# Patient Record
Sex: Female | Born: 1980 | Race: Black or African American | Hispanic: No | Marital: Single | Smoking: Former smoker
Health system: Southern US, Community
[De-identification: ages and names within clinical notes are randomized; demographics above are authoritative.]

## PROBLEM LIST (undated history)

## (undated) ENCOUNTER — Inpatient Hospital Stay (HOSPITAL_COMMUNITY): Payer: Self-pay

## (undated) DIAGNOSIS — J45909 Unspecified asthma, uncomplicated: Secondary | ICD-10-CM

## (undated) DIAGNOSIS — L708 Other acne: Secondary | ICD-10-CM

## (undated) DIAGNOSIS — B009 Herpesviral infection, unspecified: Secondary | ICD-10-CM

## (undated) DIAGNOSIS — R87619 Unspecified abnormal cytological findings in specimens from cervix uteri: Secondary | ICD-10-CM

## (undated) DIAGNOSIS — IMO0002 Reserved for concepts with insufficient information to code with codable children: Secondary | ICD-10-CM

## (undated) HISTORY — PX: ROTATOR CUFF REPAIR: SHX139

## (undated) HISTORY — DX: Herpesviral infection, unspecified: B00.9

## (undated) HISTORY — DX: Reserved for concepts with insufficient information to code with codable children: IMO0002

## (undated) HISTORY — DX: Unspecified asthma, uncomplicated: J45.909

## (undated) HISTORY — DX: Unspecified abnormal cytological findings in specimens from cervix uteri: R87.619

## (undated) HISTORY — PX: TOOTH EXTRACTION: SUR596

## (undated) HISTORY — PX: FINGER SURGERY: SHX640

---
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
## 2002-01-11 ENCOUNTER — Emergency Department (HOSPITAL_COMMUNITY): Admission: EM | Admit: 2002-01-11 | Discharge: 2002-01-11 | Payer: Self-pay | Admitting: Emergency Medicine

## 2002-02-13 ENCOUNTER — Other Ambulatory Visit: Admission: RE | Admit: 2002-02-13 | Discharge: 2002-02-13 | Payer: Self-pay | Admitting: *Deleted

## 2002-05-15 ENCOUNTER — Encounter: Payer: Self-pay | Admitting: Emergency Medicine

## 2002-05-15 ENCOUNTER — Emergency Department (HOSPITAL_COMMUNITY): Admission: EM | Admit: 2002-05-15 | Discharge: 2002-05-15 | Payer: Self-pay | Admitting: Emergency Medicine

## 2002-06-04 ENCOUNTER — Emergency Department (HOSPITAL_COMMUNITY): Admission: EM | Admit: 2002-06-04 | Discharge: 2002-06-04 | Payer: Self-pay | Admitting: Emergency Medicine

## 2002-06-04 ENCOUNTER — Encounter: Payer: Self-pay | Admitting: Emergency Medicine

## 2002-11-17 ENCOUNTER — Other Ambulatory Visit: Admission: RE | Admit: 2002-11-17 | Discharge: 2002-11-17 | Payer: Self-pay | Admitting: Obstetrics & Gynecology

## 2003-04-05 ENCOUNTER — Other Ambulatory Visit: Admission: RE | Admit: 2003-04-05 | Discharge: 2003-04-05 | Payer: Self-pay | Admitting: Obstetrics and Gynecology

## 2003-04-08 ENCOUNTER — Inpatient Hospital Stay (HOSPITAL_COMMUNITY): Admission: AD | Admit: 2003-04-08 | Discharge: 2003-04-08 | Payer: Self-pay | Admitting: Obstetrics and Gynecology

## 2003-04-25 ENCOUNTER — Inpatient Hospital Stay (HOSPITAL_COMMUNITY): Admission: AD | Admit: 2003-04-25 | Discharge: 2003-04-25 | Payer: Self-pay | Admitting: Obstetrics and Gynecology

## 2003-06-17 ENCOUNTER — Inpatient Hospital Stay (HOSPITAL_COMMUNITY): Admission: AD | Admit: 2003-06-17 | Discharge: 2003-06-19 | Payer: Self-pay | Admitting: Obstetrics and Gynecology

## 2003-07-09 ENCOUNTER — Inpatient Hospital Stay (HOSPITAL_COMMUNITY): Admission: AD | Admit: 2003-07-09 | Discharge: 2003-07-09 | Payer: Self-pay | Admitting: *Deleted

## 2003-09-11 ENCOUNTER — Inpatient Hospital Stay (HOSPITAL_COMMUNITY): Admission: AD | Admit: 2003-09-11 | Discharge: 2003-09-14 | Payer: Self-pay | Admitting: Obstetrics and Gynecology

## 2003-10-25 ENCOUNTER — Other Ambulatory Visit: Admission: RE | Admit: 2003-10-25 | Discharge: 2003-10-25 | Payer: Self-pay | Admitting: Obstetrics and Gynecology

## 2003-11-20 ENCOUNTER — Emergency Department (HOSPITAL_COMMUNITY): Admission: EM | Admit: 2003-11-20 | Discharge: 2003-11-20 | Payer: Self-pay | Admitting: Emergency Medicine

## 2004-02-07 ENCOUNTER — Emergency Department (HOSPITAL_COMMUNITY): Admission: EM | Admit: 2004-02-07 | Discharge: 2004-02-08 | Payer: Self-pay

## 2004-04-18 ENCOUNTER — Other Ambulatory Visit: Admission: RE | Admit: 2004-04-18 | Discharge: 2004-04-18 | Payer: Self-pay | Admitting: Obstetrics and Gynecology

## 2004-05-27 ENCOUNTER — Inpatient Hospital Stay (HOSPITAL_COMMUNITY): Admission: EM | Admit: 2004-05-27 | Discharge: 2004-05-31 | Payer: Self-pay | Admitting: Emergency Medicine

## 2004-07-18 ENCOUNTER — Emergency Department (HOSPITAL_COMMUNITY): Admission: EM | Admit: 2004-07-18 | Discharge: 2004-07-18 | Payer: Self-pay | Admitting: Emergency Medicine

## 2004-10-12 ENCOUNTER — Emergency Department (HOSPITAL_COMMUNITY): Admission: EM | Admit: 2004-10-12 | Discharge: 2004-10-12 | Payer: Self-pay | Admitting: Family Medicine

## 2004-11-01 ENCOUNTER — Emergency Department (HOSPITAL_COMMUNITY): Admission: EM | Admit: 2004-11-01 | Discharge: 2004-11-01 | Payer: Self-pay | Admitting: Emergency Medicine

## 2004-12-10 ENCOUNTER — Inpatient Hospital Stay (HOSPITAL_COMMUNITY): Admission: AD | Admit: 2004-12-10 | Discharge: 2004-12-10 | Payer: Self-pay | Admitting: Obstetrics and Gynecology

## 2005-03-12 ENCOUNTER — Emergency Department (HOSPITAL_COMMUNITY): Admission: EM | Admit: 2005-03-12 | Discharge: 2005-03-12 | Payer: Self-pay | Admitting: Family Medicine

## 2005-03-22 ENCOUNTER — Emergency Department (HOSPITAL_COMMUNITY): Admission: EM | Admit: 2005-03-22 | Discharge: 2005-03-22 | Payer: Self-pay | Admitting: Family Medicine

## 2005-03-29 ENCOUNTER — Emergency Department (HOSPITAL_COMMUNITY): Admission: EM | Admit: 2005-03-29 | Discharge: 2005-03-29 | Payer: Self-pay | Admitting: Family Medicine

## 2005-04-07 ENCOUNTER — Emergency Department (HOSPITAL_COMMUNITY): Admission: EM | Admit: 2005-04-07 | Discharge: 2005-04-07 | Payer: Self-pay | Admitting: Family Medicine

## 2005-04-09 ENCOUNTER — Other Ambulatory Visit: Admission: RE | Admit: 2005-04-09 | Discharge: 2005-04-09 | Payer: Self-pay | Admitting: Obstetrics and Gynecology

## 2005-05-10 ENCOUNTER — Ambulatory Visit (HOSPITAL_COMMUNITY): Admission: RE | Admit: 2005-05-10 | Discharge: 2005-05-10 | Payer: Self-pay | Admitting: Obstetrics and Gynecology

## 2005-05-24 ENCOUNTER — Ambulatory Visit: Payer: Self-pay | Admitting: Gastroenterology

## 2005-05-28 ENCOUNTER — Ambulatory Visit: Payer: Self-pay | Admitting: Internal Medicine

## 2005-05-31 ENCOUNTER — Emergency Department (HOSPITAL_COMMUNITY): Admission: EM | Admit: 2005-05-31 | Discharge: 2005-05-31 | Payer: Self-pay | Admitting: Family Medicine

## 2005-09-05 ENCOUNTER — Emergency Department (HOSPITAL_COMMUNITY): Admission: EM | Admit: 2005-09-05 | Discharge: 2005-09-05 | Payer: Self-pay | Admitting: Family Medicine

## 2005-09-08 ENCOUNTER — Inpatient Hospital Stay (HOSPITAL_COMMUNITY): Admission: AD | Admit: 2005-09-08 | Discharge: 2005-09-08 | Payer: Self-pay | Admitting: Obstetrics and Gynecology

## 2005-09-08 ENCOUNTER — Encounter (INDEPENDENT_AMBULATORY_CARE_PROVIDER_SITE_OTHER): Payer: Self-pay | Admitting: Specialist

## 2006-01-08 ENCOUNTER — Emergency Department (HOSPITAL_COMMUNITY): Admission: EM | Admit: 2006-01-08 | Discharge: 2006-01-08 | Payer: Self-pay | Admitting: Family Medicine

## 2006-05-21 ENCOUNTER — Emergency Department (HOSPITAL_COMMUNITY): Admission: EM | Admit: 2006-05-21 | Discharge: 2006-05-21 | Payer: Self-pay | Admitting: Family Medicine

## 2006-05-31 ENCOUNTER — Emergency Department (HOSPITAL_COMMUNITY): Admission: EM | Admit: 2006-05-31 | Discharge: 2006-05-31 | Payer: Self-pay | Admitting: Family Medicine

## 2006-07-23 HISTORY — PX: UNILATERAL SALPINGECTOMY: SHX6160

## 2006-08-05 ENCOUNTER — Emergency Department (HOSPITAL_COMMUNITY): Admission: EM | Admit: 2006-08-05 | Discharge: 2006-08-05 | Payer: Self-pay | Admitting: Emergency Medicine

## 2006-08-19 ENCOUNTER — Emergency Department (HOSPITAL_COMMUNITY): Admission: EM | Admit: 2006-08-19 | Discharge: 2006-08-19 | Payer: Self-pay | Admitting: Emergency Medicine

## 2006-09-10 ENCOUNTER — Emergency Department (HOSPITAL_COMMUNITY): Admission: EM | Admit: 2006-09-10 | Discharge: 2006-09-10 | Payer: Self-pay | Admitting: Family Medicine

## 2006-09-18 ENCOUNTER — Emergency Department (HOSPITAL_COMMUNITY): Admission: EM | Admit: 2006-09-18 | Discharge: 2006-09-18 | Payer: Self-pay | Admitting: Emergency Medicine

## 2006-10-09 ENCOUNTER — Emergency Department (HOSPITAL_COMMUNITY): Admission: EM | Admit: 2006-10-09 | Discharge: 2006-10-09 | Payer: Self-pay | Admitting: Family Medicine

## 2006-10-14 ENCOUNTER — Emergency Department (HOSPITAL_COMMUNITY): Admission: EM | Admit: 2006-10-14 | Discharge: 2006-10-14 | Payer: Self-pay | Admitting: Family Medicine

## 2006-11-01 ENCOUNTER — Inpatient Hospital Stay (HOSPITAL_COMMUNITY): Admission: AD | Admit: 2006-11-01 | Discharge: 2006-11-01 | Payer: Self-pay | Admitting: Obstetrics and Gynecology

## 2006-11-11 ENCOUNTER — Ambulatory Visit (HOSPITAL_COMMUNITY): Admission: RE | Admit: 2006-11-11 | Discharge: 2006-11-11 | Payer: Self-pay | Admitting: Obstetrics & Gynecology

## 2006-11-24 ENCOUNTER — Inpatient Hospital Stay (HOSPITAL_COMMUNITY): Admission: AD | Admit: 2006-11-24 | Discharge: 2006-11-24 | Payer: Self-pay | Admitting: Obstetrics and Gynecology

## 2006-11-25 ENCOUNTER — Emergency Department (HOSPITAL_COMMUNITY): Admission: EM | Admit: 2006-11-25 | Discharge: 2006-11-25 | Payer: Self-pay | Admitting: Emergency Medicine

## 2006-12-30 ENCOUNTER — Encounter (INDEPENDENT_AMBULATORY_CARE_PROVIDER_SITE_OTHER): Payer: Self-pay | Admitting: Obstetrics

## 2006-12-30 ENCOUNTER — Inpatient Hospital Stay (HOSPITAL_COMMUNITY): Admission: AD | Admit: 2006-12-30 | Discharge: 2007-01-01 | Payer: Self-pay | Admitting: Obstetrics

## 2007-01-11 ENCOUNTER — Inpatient Hospital Stay (HOSPITAL_COMMUNITY): Admission: AD | Admit: 2007-01-11 | Discharge: 2007-01-17 | Payer: Self-pay | Admitting: Obstetrics & Gynecology

## 2007-03-29 ENCOUNTER — Emergency Department (HOSPITAL_COMMUNITY): Admission: EM | Admit: 2007-03-29 | Discharge: 2007-03-29 | Payer: Self-pay | Admitting: Emergency Medicine

## 2007-06-06 ENCOUNTER — Inpatient Hospital Stay (HOSPITAL_COMMUNITY): Admission: AD | Admit: 2007-06-06 | Discharge: 2007-06-07 | Payer: Self-pay | Admitting: Obstetrics

## 2007-06-11 ENCOUNTER — Inpatient Hospital Stay (HOSPITAL_COMMUNITY): Admission: AD | Admit: 2007-06-11 | Discharge: 2007-06-11 | Payer: Self-pay | Admitting: Obstetrics

## 2007-06-14 ENCOUNTER — Inpatient Hospital Stay (HOSPITAL_COMMUNITY): Admission: AD | Admit: 2007-06-14 | Discharge: 2007-06-14 | Payer: Self-pay | Admitting: Obstetrics

## 2007-07-08 ENCOUNTER — Inpatient Hospital Stay (HOSPITAL_COMMUNITY): Admission: AD | Admit: 2007-07-08 | Discharge: 2007-07-08 | Payer: Self-pay | Admitting: Obstetrics & Gynecology

## 2008-01-31 ENCOUNTER — Emergency Department (HOSPITAL_COMMUNITY): Admission: EM | Admit: 2008-01-31 | Discharge: 2008-01-31 | Payer: Self-pay | Admitting: Emergency Medicine

## 2008-04-02 IMAGING — CT CT ABDOMEN W/ CM
1 of 6 series · 14 of 32 positions shown, 19 images · IV contrast (30ML OMNI-MIX & [ID] OMNIP 300%)
Comparison: CT 01/13/07.

CLINICAL DATA: Pelvic abscess, right-sided abdominal and pelvic pain.  Evaluate progression.
 ABDOMEN CT WITH CONTRAST:
TECHNIQUE: Multidetector CT imaging of the abdomen was performed following the standard protocol during bolus administration of intravenous contrast.
 Contrast:  100 cc Omnipaque 300 and oral contrast.
TECHNIQUE: Multidetector CT imaging of the pelvis was performed following the standard protocol during bolus administration of intravenous contrast.

[Series 4: recon 3: abd pelvis · axial · 0.58mm/px · z∈[-426,-79]mm · 14 of 396 slices shown, 19 images]
[im 25/396  soft-tissue]
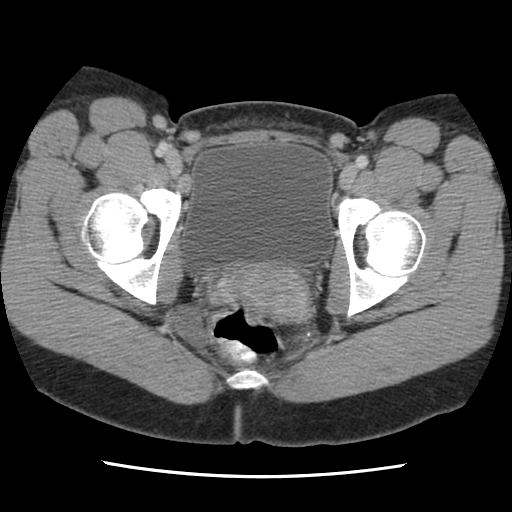
[im 25/396  bone]
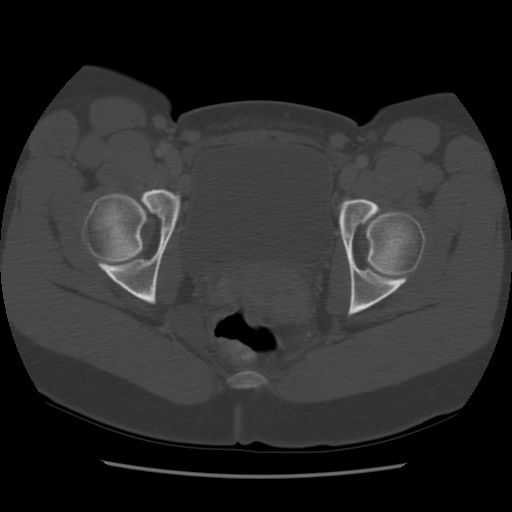
[im 50/396  soft-tissue]
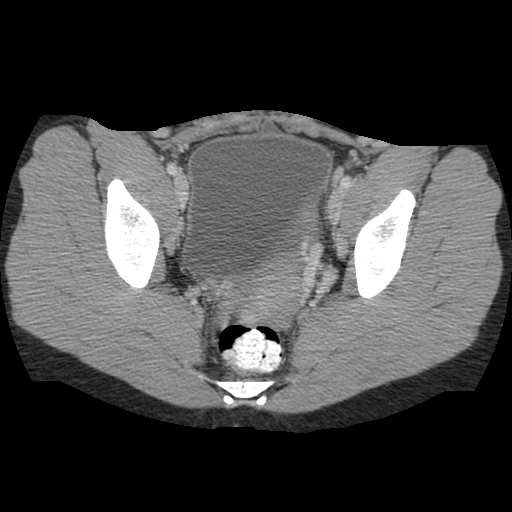
[im 75/396  soft-tissue]
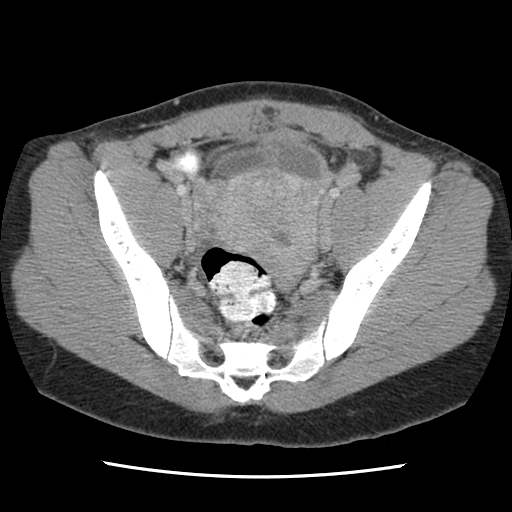
[im 124/396  soft-tissue]
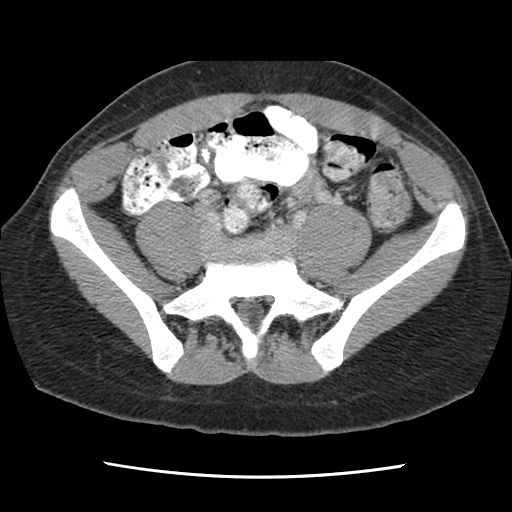
[im 149/396  soft-tissue]
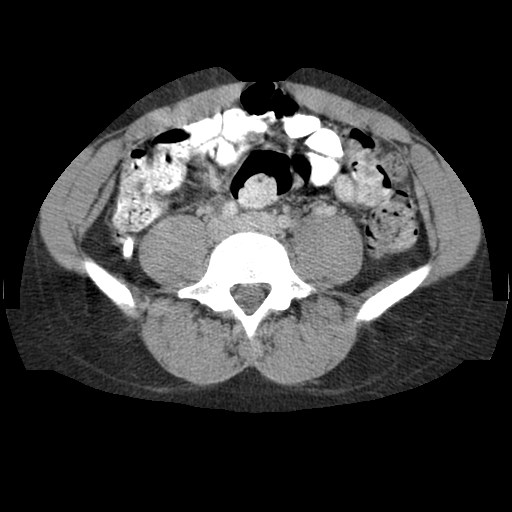
[im 173/396  soft-tissue]
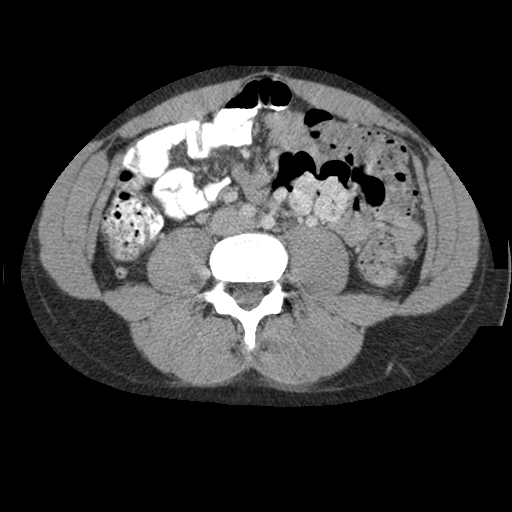
[im 198/396  soft-tissue]
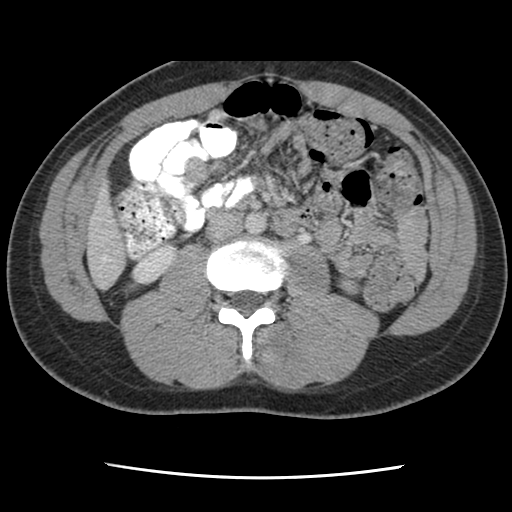
[im 223/396  soft-tissue]
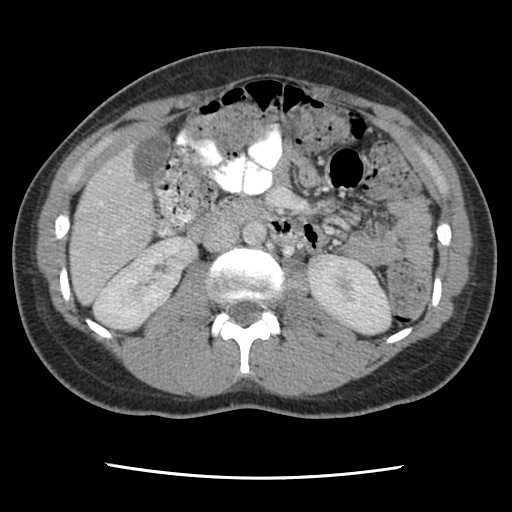
[im 247/396  soft-tissue]
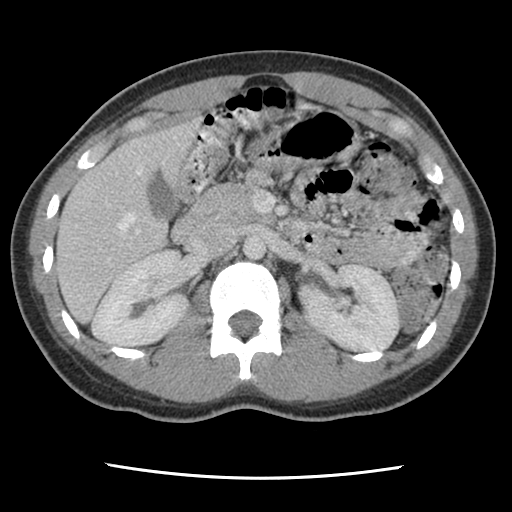
[im 247/396  bone]
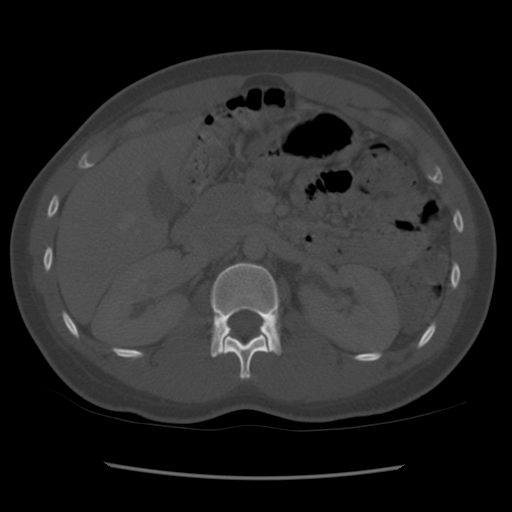
[im 272/396  soft-tissue]
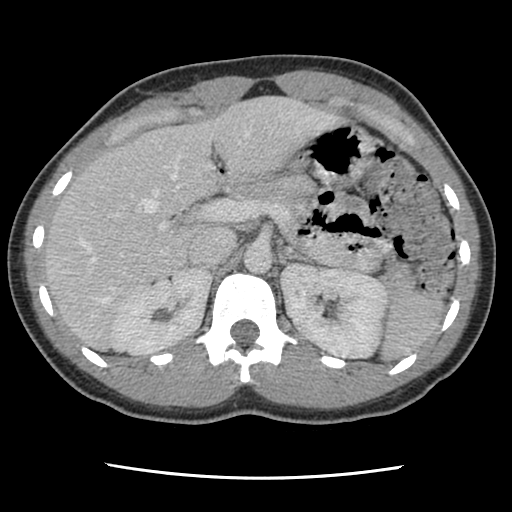
[im 297/396  lung]
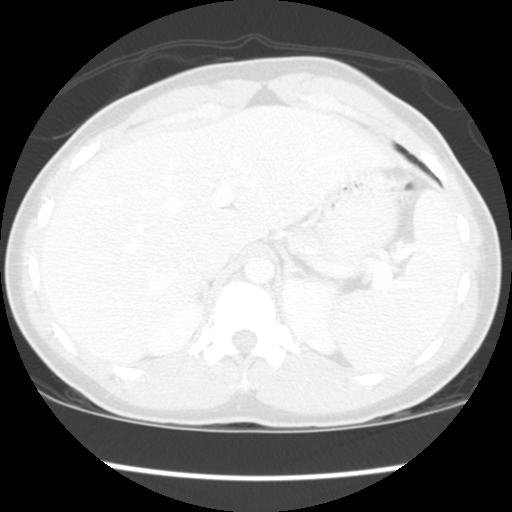
[im 321/396  soft-tissue]
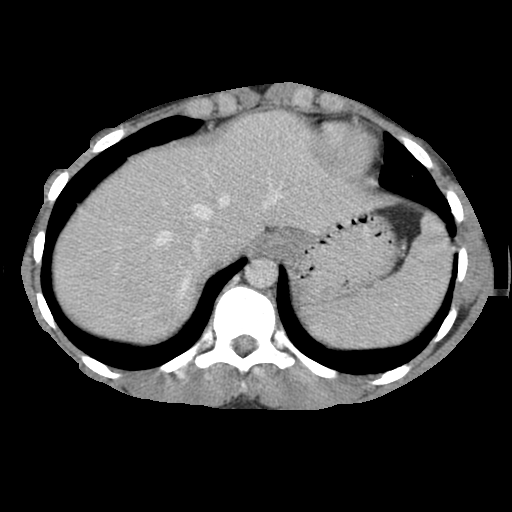
[im 321/396  lung]
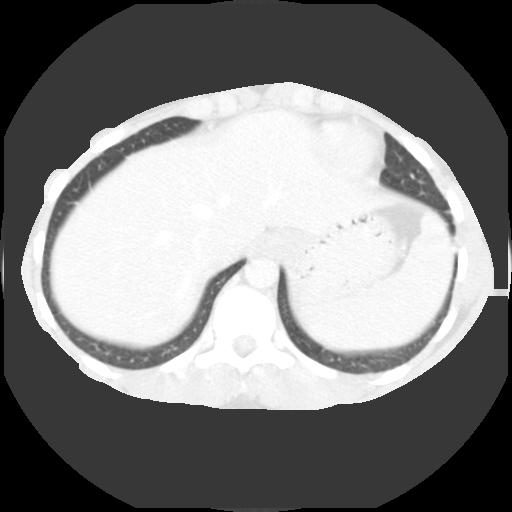
[im 346/396  soft-tissue]
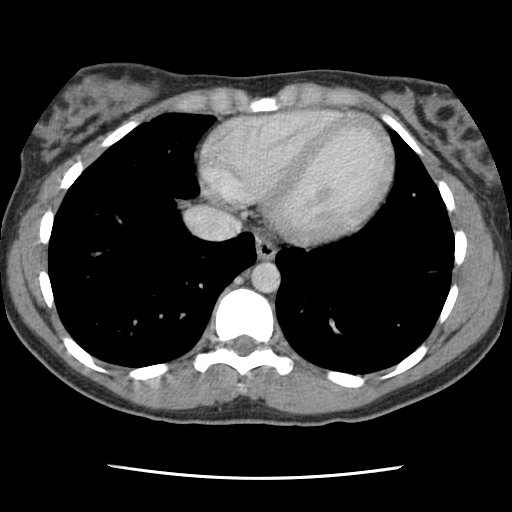
[im 346/396  lung]
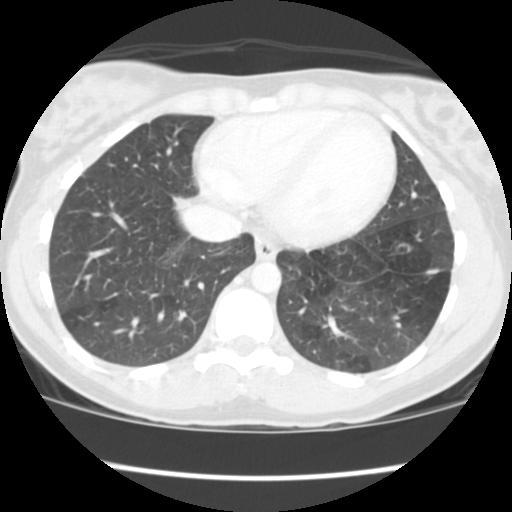
[im 371/396  soft-tissue]
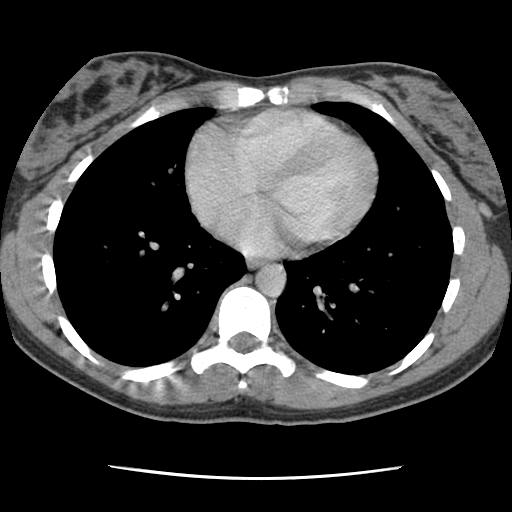
[im 371/396  lung]
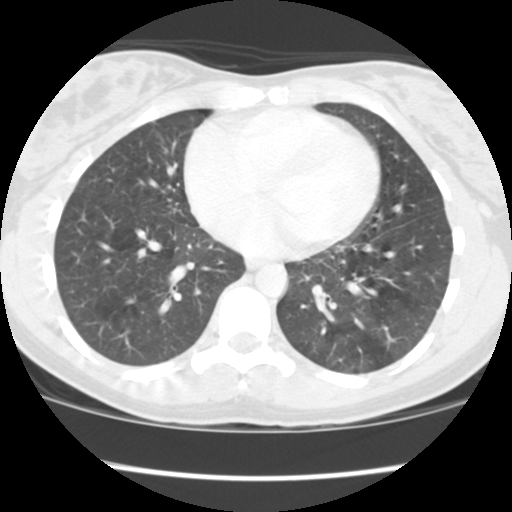

[14 of 32 positions shown; findings below may reference images not displayed]

FINDINGS: The lung bases are clear with the exception of minimal mosaic perfusion at the left lung base.  
 The abdominal parenchymal organs are unremarkable.  There is no evidence of mass or adenopathy.  No inflammatory process or abnormal fluid collections are identified.  No other significant abnormality noted.
IMPRESSION: Negative abdomen CT.    
 PELVIS CT WITH CONTRAST:
FINDINGS: Large and small bowel and appendix are normal.   The previously seen rim-enhancing fluid collection anterior to the uterus and abutting the superior aspect of the bladder measures 3.4 x 2.1 cm, unchanged.   A small amount of free pelvic fluid is noted dependently in the pelvis.
IMPRESSION: No interval change in size or appearance of rim-enhancing pelvic fluid collection consistent with abscess/clot. No new finding.

## 2008-04-03 ENCOUNTER — Emergency Department (HOSPITAL_COMMUNITY): Admission: EM | Admit: 2008-04-03 | Discharge: 2008-04-03 | Payer: Self-pay | Admitting: Emergency Medicine

## 2008-04-12 ENCOUNTER — Emergency Department (HOSPITAL_COMMUNITY): Admission: EM | Admit: 2008-04-12 | Discharge: 2008-04-12 | Payer: Self-pay | Admitting: Family Medicine

## 2008-06-10 ENCOUNTER — Emergency Department (HOSPITAL_COMMUNITY): Admission: EM | Admit: 2008-06-10 | Discharge: 2008-06-10 | Payer: Self-pay | Admitting: Emergency Medicine

## 2009-03-08 ENCOUNTER — Emergency Department (HOSPITAL_COMMUNITY): Admission: EM | Admit: 2009-03-08 | Discharge: 2009-03-08 | Payer: Self-pay | Admitting: Family Medicine

## 2009-10-17 ENCOUNTER — Ambulatory Visit: Payer: Self-pay | Admitting: Physician Assistant

## 2009-10-17 ENCOUNTER — Inpatient Hospital Stay (HOSPITAL_COMMUNITY): Admission: AD | Admit: 2009-10-17 | Discharge: 2009-10-17 | Payer: Self-pay | Admitting: Obstetrics

## 2009-10-21 ENCOUNTER — Emergency Department (HOSPITAL_COMMUNITY): Admission: EM | Admit: 2009-10-21 | Discharge: 2009-10-21 | Payer: Self-pay | Admitting: Family Medicine

## 2009-10-21 ENCOUNTER — Ambulatory Visit (HOSPITAL_COMMUNITY): Admission: RE | Admit: 2009-10-21 | Discharge: 2009-10-21 | Payer: Self-pay | Admitting: Obstetrics

## 2009-11-11 ENCOUNTER — Ambulatory Visit (HOSPITAL_COMMUNITY): Admission: RE | Admit: 2009-11-11 | Discharge: 2009-11-11 | Payer: Self-pay | Admitting: Obstetrics

## 2010-04-27 ENCOUNTER — Emergency Department (HOSPITAL_COMMUNITY): Admission: EM | Admit: 2010-04-27 | Discharge: 2010-04-27 | Payer: Self-pay | Admitting: Emergency Medicine

## 2010-08-13 ENCOUNTER — Encounter: Payer: Self-pay | Admitting: Obstetrics

## 2010-08-13 ENCOUNTER — Encounter: Payer: Self-pay | Admitting: Obstetrics & Gynecology

## 2010-09-27 ENCOUNTER — Emergency Department (HOSPITAL_COMMUNITY)
Admission: EM | Admit: 2010-09-27 | Discharge: 2010-09-27 | Disposition: A | Discharge status: Home / Self Care | Payer: Medicaid Other | Attending: Emergency Medicine | Admitting: Emergency Medicine

## 2010-09-27 DIAGNOSIS — L259 Unspecified contact dermatitis, unspecified cause: Secondary | ICD-10-CM | POA: Insufficient documentation

## 2010-09-27 DIAGNOSIS — R21 Rash and other nonspecific skin eruption: Secondary | ICD-10-CM | POA: Insufficient documentation

## 2010-09-27 DIAGNOSIS — J45909 Unspecified asthma, uncomplicated: Secondary | ICD-10-CM | POA: Insufficient documentation

## 2010-09-28 ENCOUNTER — Emergency Department (HOSPITAL_COMMUNITY)
Admission: EM | Admit: 2010-09-28 | Discharge: 2010-09-29 | Disposition: A | Discharge status: Home / Self Care | Payer: Medicaid Other | Attending: Emergency Medicine | Admitting: Emergency Medicine

## 2010-09-28 DIAGNOSIS — R21 Rash and other nonspecific skin eruption: Secondary | ICD-10-CM | POA: Insufficient documentation

## 2010-09-28 DIAGNOSIS — L509 Urticaria, unspecified: Secondary | ICD-10-CM | POA: Insufficient documentation

## 2010-10-04 LAB — POCT PREGNANCY, URINE: Preg Test, Ur: NEGATIVE

## 2010-10-04 LAB — URINALYSIS, ROUTINE W REFLEX MICROSCOPIC
Bilirubin Urine: NEGATIVE
Glucose, UA: NEGATIVE mg/dL
Ketones, ur: NEGATIVE mg/dL
Nitrite: NEGATIVE
Specific Gravity, Urine: 1.013 (ref 1.005–1.030)
Urobilinogen, UA: 1 mg/dL (ref 0.0–1.0)
pH: 6.5 (ref 5.0–8.0)

## 2010-10-04 LAB — URINE MICROSCOPIC-ADD ON

## 2010-11-17 ENCOUNTER — Other Ambulatory Visit (HOSPITAL_COMMUNITY): Payer: Self-pay | Admitting: Obstetrics

## 2010-11-17 DIAGNOSIS — N83209 Unspecified ovarian cyst, unspecified side: Secondary | ICD-10-CM

## 2010-11-30 ENCOUNTER — Ambulatory Visit (HOSPITAL_COMMUNITY): Payer: Medicaid Other

## 2010-11-30 ENCOUNTER — Ambulatory Visit (HOSPITAL_COMMUNITY)
Admission: RE | Admit: 2010-11-30 | Discharge: 2010-11-30 | Disposition: A | Discharge status: Home / Self Care | Payer: Medicaid Other | Source: Ambulatory Visit | Attending: Obstetrics | Admitting: Obstetrics

## 2010-11-30 DIAGNOSIS — N83209 Unspecified ovarian cyst, unspecified side: Secondary | ICD-10-CM | POA: Insufficient documentation

## 2010-12-05 NOTE — Op Note (Signed)
Julia Bautista, Julia Bautista               ACCOUNT NO.:  [id number]   MEDICAL RECORD NO.:  [id number]          PATIENT TYPE:  INP   LOCATION:  9303                          [facility]ACILITY:  [facility]   PHYSICIAN:  Kathreen Cosier, M.D.DATE OF BIRTH:  1981/05/01   DATE OF PROCEDURE:  12/30/2006  DATE OF DISCHARGE:                               OPERATIVE REPORT   PREOPERATIVE DIAGNOSES:  Intrauterine pregnancy at nine weeks with  hemoperitoneum.   PROCEDURE:  1. Ligation of artery in right ovary.  2. Removal of a corneal mass on the right.   SURGEON:  Kathreen Cosier, M.D.   ANESTHESIA:  General anesthesia.   DESCRIPTION OF PROCEDURE:  The patient was placed on the table in the  supine position.  General anesthesia administered by Dr. Germaine Pomfret.  The abdomen prepped and draped.  The bladder emptied with the  Foley catheter.  A mini-lap was made through the old scar transversely  and carried down to the fascia.  The fascia incised for the length of  the incision.  The recti muscles were tracked laterally.  The peritoneum  incised longitudinally.  She had 400 mL of free blood and clots in her  abdominal cavity.  The uterus was enlarged approximately a 10-weeks'  size.  The left tube and ovary were normal.  On the right the ovary was  normal size and on the lateral aspect of the ovary there appeared to  have been a cyst which ruptured; however, there was noted to be a small  artery which was pumping where the defect in the ovary was.  This was  clamped with a Kelly clamp.  Hemostasis achieved with a suture of #0  Vicryl.  The right tube also contained a mass at the cornual region.  This was about 1 cm x 1 cm and it was dark in color and firm.  Then  using the scalpel, a linear incision was made over this mass and the  tube entered and tissue removed and sent to pathology.  Hemostasis  achieved with continuous suture of #2-0 Vicryl, to re-close that portion  of the tube.  The  abdominal cavity was irrigated.  The lap and sponge  counts were correct.  The abdomen was closed in layers.  The peritoneum  closed with continuous suture of #0 chromic and the fascia with a  continuous suture of #0 Dexon.  The skin was closed with subcutaneous  stitch of #4-0 Monocryl.           ______________________________  Kathreen Cosier, M.D.     BAM/MEDQ  D:  12/30/2006  T:  12/31/2006  Job:  [id number]

## 2010-12-08 NOTE — Op Note (Signed)
Julia Bautista, Julia Bautista                      ACCOUNT NO.:  [id number]   MEDICAL RECORD NO.:  [id number]                   PATIENT TYPE:  INP   LOCATION:  9147                                 [facility]   PHYSICIAN:  Crist Fat. Rivard, M.D.              DATE OF BIRTH:  1980-12-11   DATE OF PROCEDURE:  09/11/2003  DATE OF DISCHARGE:                                 OPERATIVE REPORT   PREOPERATIVE DIAGNOSIS:  Intrauterine pregnancy at 39 weeks and one day,  with failure to progress.   POSTOPERATIVE DIAGNOSIS:  Intrauterine pregnancy at 39 weeks and one day,  with failure to progress.   ANESTHESIA:  Epidural.   ANESTHESIOLOGIST:  Germaine Pomfret, M.D.   PROCEDURE:  Primary low transverse cesarean section.   SURGEON:  Crist Fat. Rivard, M.D.   ASSISTANT:  Cam Hai, C.N.M.   ESTIMATED BLOOD LOSS:  800 cc.   DESCRIPTION OF PROCEDURE:  After being informed of the planned procedure,  with possible complications including bleeding, infection, injury to bowel,  bladder or ureters, an informed consent is obtained.  The patient was taken  to the OR #1, with reinforcement of pre-existing epidural anesthesia.  She  is placed in the dorsal decubitus position, pelvic tilted to the left,  prepped and draped in the sterile fashion and a Foley catheter is in her  bladder.  After assessing adequate level of anesthesia, we infiltrated  suprapubic area with 20 cc of Marcaine 0.25% and performed a Pfannenstiel  incision, which is brought down to the fascia.  The fascia is incised in a  low transverse fashion.  Linea alba is dissected.  Peritoneum is entered in  a midline fashion.  Visceroperitoneum is entered in a low transverse  fashion, allowing [place] to safely retract bladder by developing a bladder flap.  We can now enter the myometrium in a low transverse fashion; first with a  knife and then bluntly.   Amniotic fluid is clear.  We assist the birth of a little girl in frank OP  presentation at 9:11.  Mouth and nose are suctioned.  Loose nuchal cord is  reduced.  The baby is delivered.  The cord is clamped with two Kelly clamps  and sectioned.  The baby is given to the pediatrician present in the room.   Ancef 2 g IV is given to the patient.  We allow the placenta to deliver  spontaneously.  The cord has three vessels.  Uterine revision is negative.   We proceed with closure of the myometrium in two layers; first with a  running, locked suture of 0 Vicryl, then with a Lembert suture of 0 Vicryl  covering the first one.  Hemostasis is assessed as adequate.  Both tubes and  ovaries are assessed as normal.  Both pericolic gutters are cleansed.  The  pelvic is irrigated with warm saline.  Hemostasis is reassessed and  completed with cautery on peritoneal  edges.  Hemostasis is now adequate.  The fascial hemostasis is completed with cautery and the fascia is closed  with two running sutures of 0 Vicryl meeting midline.  The wound is  irrigated with warm saline.  Hemostasis is completed with cautery and skin  is closed with staples.   A little girl named [name] was born at 9:11 p.m.  She received an Apgar of  8 at one minute and 9 at five minutes.  She weighs  6 pounds 9 ounces.   Instruments and sponge count is complete x2.   ESTIMATED BLOOD LOSS:  800 cc.   DISPOSITION:  The patient is well tolerated by the patient, who is taken to  the recovery room in a well and stable condition.                                               Crist Fat Rivard, M.D.    SAR/MEDQ  D:  09/11/2003  T:  09/12/2003  Job:  [id number]

## 2010-12-08 NOTE — Discharge Summary (Signed)
Julia Bautista, Julia Bautista               ACCOUNT NO.:  [id number]   MEDICAL RECORD NO.:  [id number]          PATIENT TYPE:  INP   LOCATION:  9307                          [facility]   PHYSICIAN:  Roseanna Rainbow, M.D.DATE OF BIRTH:  12-02-1980   DATE OF ADMISSION:  01/11/2007  DATE OF DISCHARGE:  01/17/2007                               DISCHARGE SUMMARY   CHIEF COMPLAINT:  The patient is a 30 year old gravida 4, para 1-0-3-1  recently hospitalized and underwent an exploratory laparotomy for a  bleeding hemorrhagic corpus luteum cyst now complaining of right lower  quadrant pain.   HISTORY OF PRESENT ILLNESS:  Please see the above.   ALLERGIES:  SULFUR.   PAST SURGICAL HISTORY:  Please see the above.  She has had a cesarean  delivery.   PHYSICAL EXAM:  GENERAL:  Well-developed female in no apparent distress.  ABDOMEN: Nondistended.  There is right lower quadrant tenderness  appreciated.  PELVIC:  Deferred.   ASSESSMENT:  Postoperative pain.   PLAN:  Admission, broad-spectrum parenteral antibiotics, and pelvic  ultrasound.   HOSPITAL COURSE:  The patient was admitted.  She complained that she had  an episode of vomiting on hospital day #1. On ultrasound demonstrated a  thickened heterogeneous endometrium, possible retained products. Please  note in the interim the patient had a voluntary termination of  pregnancy.  There was minimal free fluid hyperechoic area noted near the  right ovary likely residual hemorrhagic cyst.  A CT scan of the pelvis  and abdomen was obtained. This demonstrated a rim of enhancement  anterior to the uterus that measured 6 cm in greatest diameter which was  felt to represent either resolving blood clot or abscess.  The  antibiotics were continued.  She remained afebrile.  However, her pain  persisted. Repeat CT of the abdomen and pelvis there was no changes in  this collection.  At this point the decision was made to discharge the  patient to  home and to continue p.o. course of antibiotics.   DISCHARGE DIAGNOSIS:  Likely with residual intra-abdominal blood clot  status post recent exploratory laparotomy.   CONDITION:  Stable.   DIET:  Regular.   ACTIVITY:  Pelvic rest.   MEDICATIONS:  Ultram, ibuprofen, and doxycycline, and Flagyl.   DISPOSITION:  The patient is a follow-up appointment on June 26 at 3:10  p.m. in the office.      Roseanna Rainbow, M.D.  Electronically Signed     LAJ/MEDQ  D:  02/16/2007  T:  02/17/2007  Job:  [id number]

## 2010-12-08 NOTE — Discharge Summary (Signed)
Julia Bautista, Julia Bautista                         ACCOUNT NO.:  [id number]   MEDICAL RECORD NO.:  [id number]                   PATIENT TYPE:  INP   LOCATION:  9147                                 [facility]   PHYSICIAN:  Osborn Coho, M.D.                DATE OF BIRTH:  12/25/1980   DATE OF ADMISSION:  09/11/2003  DATE OF DISCHARGE:  09/14/2003                                 DISCHARGE SUMMARY   ADMISSION DIAGNOSES:  1. Intrauterine pregnancy at term.  2. Spontaneous rupture of the membranes.  3. Early labor.  4. Positive group B streptococcus.  5. 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   DISCHARGE DIAGNOSES:  1. Intrauterine pregnancy at term.  2. Spontaneous rupture of the membranes.  3. Early labor.  4. Positive group B streptococcus.  5. 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  6. Failure to progress in labor.  7. Status post primary low transverse cesarean section.  8. Breast-feeding.  9. Undecided regarding contraception.   PROCEDURES THIS ADMISSION:  Primary low transverse cesarean section for  delivery of a viable female infant named Julia Bautista who weighed 6 pounds 9  ounces and had Apgars of 8 and 9 on September 11, 2003 attended and delivered  by Dr. Dois Davenport Rivard and Philipp Deputy, C.N.M.   HOSPITAL COURSE:  Ms. Colantuono is a 30 year old single black female gravida 2,  para 0-0-1-0 at 39-1/7 weeks who was admitted with spontaneous rupture of  the membranes and early labor.  She progressed to 5-6 cm by 1:30 p.m. on  September 11, 2003 however, failed to progress beyond that even with adequate  labor diagnosed by [facility], at 7:10 p.m. she was still 5-6 cm and was offered  the option of cesarean section at that time however, the patient elected to  observe her for one more hour, by 8:20 p.m. her cervix was unchanged and  they agreed to proceed with cesarean section for delivery.  She underwent  the same for delivery of a viable female infant  named Julia Bautista who had Apgars  of 8 and 9 and weighed 6 pounds 9 ounces and was attended and delivered by  Dr. Dois Davenport Rivard and Philipp Deputy, C.N.M.  Please see operative note for  further details.  Postoperatively the patient has done well.  She is  ambulating, voiding, and eating without difficulty.  Her vital signs are  stable and she has been afebrile throughout her hospital stay.  She is  breast-feeding without difficulty.  She is currently undecided regarding  contraception but is leaning towards the NuvaRing at 6 weeks.  She is deemed  ready for discharge today.  Her discharge instructions are as per the  [facility] handout.  Her discharge medications are Vicodin one  to two p.o. q.4-6h. p.r.n. for pain and Motrin 600 mg p.o. q.6h. p.r.n. for  pain and ferrous sulfate once  daily and prenatal vitamins once daily.   DISCHARGE LABORATORIES:  Her hemoglobin is 8.4, WBC count is 12.3 and  platelets of 201,000.   DISCHARGE FOLLOWUP:  Four to six weeks at [facility] or p.r.n.     Concha Pyo. Duplantis, C.N.M.              Osborn Coho, M.D.    SJD/MEDQ  D:  09/14/2003  T:  09/14/2003  Job:  [id number]

## 2010-12-08 NOTE — Op Note (Signed)
Julia Bautista. [facility]  Patient:    Julia Bautista, Julia Bautista                        MRN: [id number] Proc. Date: 02/20/01 Attending:  Loreta Ave, M.D.                           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  OPERATION PERFORMED:  Right shoulder examination under anesthesia, arthroscopy.  Open anterior Bankart reconstruction utilizing bioabsorbable anchors x 3.  SURGEON:  Loreta Ave, M.D.  ASSISTANT:  Arlys John D. Petrarca, P.A.-C.  ANESTHESIA:  General.  ESTIMATED BLOOD LOSS:  Minimal.  SPECIMENS:  None.  CULTURES:  None.  COMPLICATIONS:  None.  DRESSING:  Soft compressive with shoulder immobilizer.  INDICATIONS FOR PROCEDURE:  The patient was brought to the operating room and placed on the operating table in supine position.  After adequate anesthesia had been obtained, right shoulder examined.  Full motion.  Anterior and to a lesser extent, anteroinferior instability confirmed.  No posterior instability.  Placed in a beach chair position on the shoulder positioner. Prepped and draped in the usual sterile fashion.  Anterior posterior arthroscopic portals.  Shoulder entered with a blunt obturator, distended and inspected.  Obvious significant Bankart lesion entire front of the glenoid surface with deficiency of the anterior labrum from trauma.  Biceps tendon, biceps anchor, remaining articular cartilage and rotator cuff all intact. Relatively large Hill-Sachs lesion also occurring at the time of trauma.  Once pathology confirmed, instruments and fluid removed.  Anterior incision coracoid, anterior axillary fold.  Skin and subcutaneous tissues divided. Deltopectoral interval opened, exposing the conjoined tendon which was retracted.   Neurovascular structures protected.  Subscap tendon was taken down anatomically along with the capsule, tagged with #2 Ethibond.  Pathology confirmed.  Wound irrigated.  The front of the glenoid was then significantly roughened with a bur to create a bleeding bony surface.  Three bioabsorbable 3 mm anchors were then seated at the 2, 4 and 6 oclock position.  The nonabsorbable suture from the anchor were then sewn into the capsule at the capsular labral interface and then firmly tied down to obliterate the Bankart lesion and restore stability to the shoulder.  Wound irrigated.  Subscap and capsule repaired anatomically with Ethibond.  Deltopectoral interval allowed to close.  Subcutaneous and subcuticular closure over wound and portal closure with nylon.  Margins of wound injected with Marcaine.  Sterile compressive dressing and shoulder immobilizer applied.  Anesthesia reversed.  Brought to recovery room.  Tolerated surgery well.  No complications.  DESCRIPTION OF PROCEDURE: DD:  02/20/01 TD:  02/21/01 Job: [id number]
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
## 2010-12-08 NOTE — Discharge Summary (Signed)
Julia, Bautista               ACCOUNT NO.:  [id number]   MEDICAL RECORD NO.:  [id number]          PATIENT TYPE:  INP   LOCATION:  9303                          [facility]ACILITY:  [facility]   PHYSICIAN:  Roseanna Rainbow, M.D.DATE OF BIRTH:  October 09, 1980   DATE OF ADMISSION:  12/30/2006  DATE OF DISCHARGE:  01/01/2007                               DISCHARGE SUMMARY   CHIEF COMPLAINT:  The patient is a 30 year old gravida 4, para 1 with 9-  week intrauterine pregnancy complaining of right lower quadrant pain.   HISTORY OF PRESENT ILLNESS:  Please see the above.  The ultrasound was  consistent with likely hemoperitoneum.   ALLERGIES:  DARVOCET, SULFA, PERCOCET.   PAST MEDICAL HISTORY:  Asthma.   MEDICATIONS:  Please see the medication reconciliation form.   PAST SURGICAL HISTORY:  Cesarean delivery, shoulder surgery.   PHYSICAL EXAM:  VITAL SIGNS:  Stable, afebrile.  GENERAL APPEARANCE:  Well-nourished in distress.  ABDOMEN: Distended with guarding and rebound.  PELVIC:  Deferred.   ASSESSMENT:  Early pregnancy, hemoperitoneum and differential diagnosis  ruptured hemorrhagic corpus luteum, possible ruptured heterotopic  pregnancy.   PLAN:  Admission, exploratory laparotomy.   HOSPITAL COURSE:  The patient was admitted and underwent exploratory  laparotomy by Dr. Gaynell Face.  Please see the dictated operative summary.  On postoperative day #1 her hemoglobin was 6.9. The remainder of her  hospital course was uneventful with an ultrasound on postoperative day  #2 was consistent with a viable pregnancy, no significant free fluid was  visualized.  She was discharged to home.   DISCHARGE DIAGNOSIS:  Early intrauterine pregnancy, ruptured corpus  luteum cyst.   PROCEDURE:  Exploratory laparotomy, control of arterial bleeding vessel  in the right ovary, right cornual portion of the tube with the mass  resected.  Anemia secondary to acute blood loss.   CONDITION:  Stable.   DIET:   Regular.   ACTIVITY:  Progressive activity, pelvic rest.   MEDICATIONS:  Included Vicodin, prenatal vitamins, ferrous sulfate and  over-the-counter stool  softener was recommended as needed.   DISPOSITION:  The patient was to follow up in the office on June 25 at  2:20 p.m.      Roseanna Rainbow, M.D.  Electronically Signed     LAJ/MEDQ  D:  01/30/2007  T:  01/30/2007  Job:  [id number]   cc:   Kathreen Cosier, M.D.  Fax: [phone]
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
## 2010-12-08 NOTE — Discharge Summary (Signed)
Julia Bautista, Julia Bautista               ACCOUNT NO.:  [id number]   MEDICAL RECORD NO.:  [id number]          PATIENT TYPE:  INP   LOCATION:  0379                         FACILITY:  [facility]   PHYSICIAN:  Fleet Contras, M.D.    DATE OF BIRTH:  [birth year]   DATE OF ADMISSION:  05/26/2004  DATE OF DISCHARGE:  05/31/2004                                 DISCHARGE SUMMARY   ADMISSION DIAGNOSES:  1.  Acute exacerbation of bronchial asthma.  2.  Acute bronchitis.  3.  Hypopotassemia.   DISCHARGE DIAGNOSES:  1.  Acute exacerbation of bronchial asthma.  2.  Acute bronchitis.  3.  Hypopotassemia.   HISTORY OF PRESENT ILLNESS:  Julia Bautista is a 30 year old African-American  lady admitted through the emergency room at [facility] with  complaints of progressive shortness of breath, wheezing and cough for a  week.  She had been treated for acute bronchitis with Ketek, nebulized  albuterol but her symptoms did not improve significantly at home. She  decided to come to the hospital.  On admission, her vital signs were stable,  she was afebrile but she had diffuse wheezes, rhonchi lungs bilaterally.  Initial laboratory data showed a white count of 10.6, hemoglobin 12.4,  sodium 133, potassium 3.1. Chest x-ray showed no acute infiltrates.  She was  therefore hospitalized for intravenous antibiotics, nebulized  bronchodilators and steroids.   HOSPITAL COURSE:  On admission, the patient was started on __________  Rocephin, oral Zithromax. She also received Solu-Medrol, magnesium sulfate  and nebulized albuterol and Atrovent.  She was placed on nicotine patch for  smoking cessation effort and a consult was requested from the smoking  cessation team.  She was subsequently changed to oral antibiotics and oral  steroids.  Her symptoms improved significantly. She was also started on oral  Singulair when her condition was stable on May 31, 2004 she was feeling  much better, she was ambulating with no  shortness of breath, wheezing or  cough.  Her chest was clear to auscultation except for some diffuse  bilateral rhonchi.  Repeat chest x-ray showed no new infiltrates. She was  stable was discharge home.   DISCHARGE MEDICATIONS:  1.  Tussionex 5 mL p.o. b.i.d. q.8 h.  2.  Nicotine patch 21 mg per day #30.  3.  Advair Diskus 1 puff b.i.d. 250/50.  4.  Avelox 400 mg q.d. for 7 days.  5.  Prednisone 40 mg q.d. to taper over 2 weeks.  6.  Protonix 40 mg q.d.  7.  Singulair 10 mg q.h.s.  8.  Nebulized albuterol 1 unit dose q.4-6h. p.r.n.  9.  She was advised to quit tobacco use.   DISPOSITION:  Home.   FOLLOW UP:  Dr. Concepcion Elk in one week.      EA/MEDQ  D:  07/26/2004  T:  07/26/2004  Job:  [id number]
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
## 2010-12-08 NOTE — Op Note (Signed)
[facility]  Patient:    Julia Bautista, Julia Bautista                      MRN: [id number] Proc. Date: 12/03/98 Adm. Date:  [id number] Disc. Date: [id number] Attending:  Shelba Flake                           Operative Report  PREOPERATIVE DIAGNOSIS:       Incomplete abortion at 15 weeks.  POSTOPERATIVE DIAGNOSIS:      Incomplete abortion at 15 weeks.  PROCEDURE:                    Dilatation and evacuation.  SURGEON:                      Miguel Aschoff, M.D.  ANESTHESIA:                   IV sedation with paracervical block.  COMPLICATIONS:                None.  JUSTIFICATION:                The patient is a 30 year old female who has undergone elective termination of pregnancy at the [facility] when the operating physician found it impossible to dilate her cervix to complete the procedure. In view of the partial dilatation and entry into the uterine cavity, it was felt that the procedure required completion either via an attempt to repeat D&E or via hysterotomy. The patient was transferred to [facility] to undergo this procedure.  DESCRIPTION OF PROCEDURE:     The patient was taken to the operating room and placed in the supine position and IV sedation was administered without difficulty. She was then placed in dorsolithotomy position, prepped and draped in the usual sterile fashion. Paracervical block using 1% Xylocaine was placed with 6 cc at the 12, 4, and 8 oclock positions being placed for a total of 18 cc. After this was done, the cervix was grasped with a tenaculum. Uterus was previously noted to be anterior. It was possible at this point to advance dilators into the uterine cavity without difficulty until a #51 Pratt dilator was passed. Then using Sopher forceps and suction curettage, it was possible to evacuate the contents of the uterus without difficulty. Following this, sharp curettage was carried out which only yielded a small  amount of additional tissue. With procedure being completed and with no other abnormalities being noted within the uterus, the patient was taken to the recovery room in a satisfactory condition. The estimated blood loss was approximately 400 cc. The patient tolerated the procedure well. The patients blood type was noted to be Rh positive.  PLAN:                         The patient will be discharged home.  DISCHARGE MEDICATIONS:        1. Doxycycline 100 mg twice a day x 3 days.                               2. Darvocet-N 100 as needed for pain.  INSTRUCTIONS:                 The patient is to call if there are  problems such as fever, pain, or heavy bleeding. DD:  04/21/00 TD:  04/21/00 Job: [id number]

## 2010-12-08 NOTE — Op Note (Signed)
[facility]  Patient:    Julia Bautista, Julia Bautista Visit Number: [id number] MRN: [id number]          Service Type: DSU Location: [facility] Attending Physician:  Colbert Ewing Dictated by:   Loreta Ave, M.D. Proc. Date: 10/23/01 Admit Date:  10/23/2001                             Operative Report  PREOPERATIVE DIAGNOSES: 1. Instability, multidirectional, mostly anteroinferior, right shoulder. 2. Status post previous Bankart reconstruction.  POSTOPERATIVE DIAGNOSES: 1. Instability, multidirectional, mostly anteroinferior, right shoulder. 2. Status post previous Bankart reconstruction with intact Bankart reconstruction but stretched capsule.  PROCEDURES: 1. Right shoulder examination under anesthesia, arthroscopy. 2. Open anteroinferior capsular shift.  SURGEON:  Loreta Ave, M.D.  ASSISTANT:  Arlys John D. Petrarca, P.A.-C.  ANESTHESIA:  General.  ESTIMATED BLOOD LOSS:  Minimal.  SPECIMENS:  None.  CULTURES:  None.  COMPLICATIONS:  None.  DRESSING:  Soft compressive with shoulder immobilizer.  DESCRIPTION OF PROCEDURE:  The patient was brought to the operating room and placed on the operating table in supine position.  After adequate had been obtained, right shoulder examined.  Still has multidirectional instability, most of it anteroinferior.  Able to be subluxed but not dislocated.  Placed in a beach chair position on the shoulder positioner, prepped and draped in the usual sterile fashion.  Anterior and posterior arthroscopic portals. Arthroscope introduced and the shoulder inspected.  The anchors for the Bankart reconstruction were intact, and the capsule was still firmly adherent to the front of the glenoid throughout the entire area of repair.  A large Hill-Sachs lesion was still identified.  The identifiable pathology for persistent laxity was due to capsular laxity and not a recurrent tearing or failure of her Bankart  reconstruction.  Other structures intact.  Instruments and fluid removed.  Previous anterior incision was opened, excising the previous scar.  Deltopectoral interval opened.  Subscap taken down anatomically, leaving some of the tendon on the capsule to thicken it.  The capsule was then cut in a teed manner from the 12 to 6 oclock position on the humerus and then from the humerus to the glenoid at the 3 oclock position.  A shift was then performed, bringing the inferior leaflet up to the 12 oclock position and superior leaflet brought over top of this down to the 6 oclock position.  Sutured in place with numerous interrupted, nonabsorbable sutures. This markedly improved stability, maintaining good motion.  Subscap repaired anatomically with Ethibond.  Wound irrigated.  Deltopectoral interval was allowed to close.  Subcuticular closure of the anterior wound and closure of the posterior portal with nylon.  Margins of the wound injected with nylon.  A sterile compressive dressing and shoulder immobilizer were applied. Anesthesia reversed, brought to the recovery room.  Tolerated the surgery well with no complications. Dictated by:   Loreta Ave, M.D. Attending Physician:  Colbert Ewing DD:  10/23/01 TD:  10/24/01 Job: [id number]

## 2011-04-19 LAB — POCT URINALYSIS DIP (DEVICE)
Bilirubin Urine: NEGATIVE
Ketones, ur: NEGATIVE
pH: 6

## 2011-04-19 LAB — GC/CHLAMYDIA PROBE AMP, GENITAL: Chlamydia, DNA Probe: NEGATIVE

## 2011-04-19 LAB — WET PREP, GENITAL

## 2011-04-19 LAB — POCT PREGNANCY, URINE: Preg Test, Ur: NEGATIVE

## 2011-04-27 LAB — CBC
Platelets: 223
WBC: 5.3

## 2011-05-01 LAB — DIFFERENTIAL
Eosinophils Relative: 3
Lymphocytes Relative: 23
Lymphs Abs: 1.1

## 2011-05-01 LAB — WET PREP, GENITAL
Trich, Wet Prep: NONE SEEN
Yeast Wet Prep HPF POC: NONE SEEN

## 2011-05-01 LAB — GC/CHLAMYDIA PROBE AMP, GENITAL
Chlamydia, DNA Probe: NEGATIVE
GC Probe Amp, Genital: NEGATIVE

## 2011-05-01 LAB — SICKLE CELL SCREEN: Sickle Cell Screen: NEGATIVE

## 2011-05-01 LAB — CBC
HCT: 32.9 — ABNORMAL LOW
Hemoglobin: 11 — ABNORMAL LOW
MCHC: 33.7
MCV: 78.1
Platelets: 236
Platelets: 250
RDW: 13.5
WBC: 4.7
WBC: 5.1

## 2011-05-01 LAB — TYPE AND SCREEN: ABO/RH(D): AB POS

## 2011-05-01 LAB — URINALYSIS, ROUTINE W REFLEX MICROSCOPIC
Glucose, UA: NEGATIVE
Hgb urine dipstick: NEGATIVE
Ketones, ur: NEGATIVE
Ketones, ur: NEGATIVE
Nitrite: NEGATIVE
Protein, ur: NEGATIVE
Protein, ur: NEGATIVE
Urobilinogen, UA: 0.2
pH: 6.5

## 2011-05-01 LAB — HEPATITIS B SURFACE ANTIGEN: Hepatitis B Surface Ag: NEGATIVE

## 2011-05-01 LAB — HCG, QUANTITATIVE, PREGNANCY: hCG, Beta Chain, Quant, S: 147729 — ABNORMAL HIGH

## 2011-05-01 LAB — ABO/RH: ABO/RH(D): AB POS

## 2011-05-04 LAB — POCT URINALYSIS DIP (DEVICE)
Protein, ur: NEGATIVE
Specific Gravity, Urine: 1.025
Urobilinogen, UA: 0.2

## 2011-05-05 ENCOUNTER — Emergency Department (HOSPITAL_COMMUNITY)
Admission: EM | Admit: 2011-05-05 | Discharge: 2011-05-05 | Disposition: A | Discharge status: Home / Self Care | Payer: Medicaid Other | Attending: Emergency Medicine | Admitting: Emergency Medicine

## 2011-05-05 ENCOUNTER — Emergency Department (HOSPITAL_COMMUNITY): Payer: Medicaid Other

## 2011-05-05 DIAGNOSIS — M7989 Other specified soft tissue disorders: Secondary | ICD-10-CM | POA: Insufficient documentation

## 2011-05-05 DIAGNOSIS — W230XXA Caught, crushed, jammed, or pinched between moving objects, initial encounter: Secondary | ICD-10-CM | POA: Insufficient documentation

## 2011-05-05 DIAGNOSIS — M79609 Pain in unspecified limb: Secondary | ICD-10-CM | POA: Insufficient documentation

## 2011-05-07 ENCOUNTER — Emergency Department (HOSPITAL_COMMUNITY)
Admission: EM | Admit: 2011-05-07 | Discharge: 2011-05-07 | Disposition: A | Discharge status: Home / Self Care | Payer: Medicaid Other | Attending: Emergency Medicine | Admitting: Emergency Medicine

## 2011-05-07 DIAGNOSIS — S6390XA Sprain of unspecified part of unspecified wrist and hand, initial encounter: Secondary | ICD-10-CM | POA: Insufficient documentation

## 2011-05-07 DIAGNOSIS — W230XXA Caught, crushed, jammed, or pinched between moving objects, initial encounter: Secondary | ICD-10-CM | POA: Insufficient documentation

## 2011-05-07 DIAGNOSIS — IMO0002 Reserved for concepts with insufficient information to code with codable children: Secondary | ICD-10-CM | POA: Insufficient documentation

## 2011-05-09 LAB — CBC
HCT: 26.5 — ABNORMAL LOW
HCT: 26.7 — ABNORMAL LOW
Hemoglobin: 8.6 — ABNORMAL LOW
Hemoglobin: 8.8 — ABNORMAL LOW
MCHC: 32.9
MCV: 79.2
RBC: 3.37 — ABNORMAL LOW
RDW: 14

## 2011-05-09 LAB — URINALYSIS, ROUTINE W REFLEX MICROSCOPIC
Bilirubin Urine: NEGATIVE
Ketones, ur: NEGATIVE
Protein, ur: NEGATIVE
Urobilinogen, UA: 0.2

## 2011-05-09 LAB — DIFFERENTIAL
Basophils Absolute: 0
Basophils Relative: 1
Eosinophils Relative: 4
Lymphocytes Relative: 37
Lymphs Abs: 1.4
Monocytes Absolute: 0.3
Monocytes Absolute: 0.4
Monocytes Relative: 9
Neutro Abs: 2.4

## 2011-05-09 LAB — CREATININE, SERUM: GFR calc non Af Amer: >60

## 2011-05-09 LAB — URINE MICROSCOPIC-ADD ON

## 2011-05-10 ENCOUNTER — Ambulatory Visit (HOSPITAL_BASED_OUTPATIENT_CLINIC_OR_DEPARTMENT_OTHER)
Admission: RE | Admit: 2011-05-10 | Discharge: 2011-05-10 | Disposition: A | Discharge status: Home / Self Care | Payer: Self-pay | Source: Ambulatory Visit | Attending: Orthopedic Surgery | Admitting: Orthopedic Surgery

## 2011-05-10 DIAGNOSIS — S6710XA Crushing injury of unspecified finger(s), initial encounter: Secondary | ICD-10-CM | POA: Insufficient documentation

## 2011-05-10 DIAGNOSIS — Y998 Other external cause status: Secondary | ICD-10-CM | POA: Insufficient documentation

## 2011-05-10 DIAGNOSIS — S61209A Unspecified open wound of unspecified finger without damage to nail, initial encounter: Secondary | ICD-10-CM | POA: Insufficient documentation

## 2011-05-10 LAB — DIFFERENTIAL
Basophils Relative: 0
Eosinophils Absolute: 0.1
Eosinophils Relative: 1
Monocytes Absolute: 0.5
Monocytes Relative: 7

## 2011-05-10 LAB — CBC
HCT: 28.1 — ABNORMAL LOW
Hemoglobin: 9.2 — ABNORMAL LOW
MCHC: 32.5
MCHC: 32.6
MCV: 77.6 — ABNORMAL LOW
RBC: 3.63 — ABNORMAL LOW
RDW: 13.6

## 2011-05-10 LAB — POCT HEMOGLOBIN-HEMACUE: Hemoglobin: 11.4 g/dL — ABNORMAL LOW (ref 12.0–15.0)

## 2011-05-13 LAB — CULTURE, ROUTINE-ABSCESS

## 2011-05-15 LAB — ANAEROBIC CULTURE
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
## 2011-07-24 HISTORY — PX: LEEP: SHX91

## 2011-07-24 HISTORY — PX: COLPOSCOPY: SHX161

## 2012-02-07 ENCOUNTER — Encounter: Payer: Medicaid Other | Admitting: Obstetrics & Gynecology

## 2012-02-27 ENCOUNTER — Ambulatory Visit (INDEPENDENT_AMBULATORY_CARE_PROVIDER_SITE_OTHER): Payer: Medicaid Other | Admitting: Obstetrics & Gynecology

## 2012-02-27 ENCOUNTER — Encounter: Payer: Self-pay | Admitting: Obstetrics & Gynecology

## 2012-02-27 ENCOUNTER — Other Ambulatory Visit (HOSPITAL_COMMUNITY)
Admission: RE | Admit: 2012-02-27 | Discharge: 2012-02-27 | Disposition: A | Discharge status: Home / Self Care | Payer: Medicaid Other | Source: Ambulatory Visit | Attending: Obstetrics & Gynecology | Admitting: Obstetrics & Gynecology

## 2012-02-27 VITALS — BP 128/78 | HR 91 | Temp 98.4°F | Ht 63.0 in | Wt 165.2 lb

## 2012-02-27 DIAGNOSIS — Z01419 Encounter for gynecological examination (general) (routine) without abnormal findings: Secondary | ICD-10-CM

## 2012-02-27 DIAGNOSIS — Z113 Encounter for screening for infections with a predominantly sexual mode of transmission: Secondary | ICD-10-CM

## 2012-02-27 NOTE — Addendum Note (Signed)
Addended by: Doreen Salvage on: 02/27/2012 03:17 PM   Modules accepted: Orders

## 2012-02-27 NOTE — Addendum Note (Signed)
Addended by: Doreen Salvage on: 02/27/2012 03:31 PM   Modules accepted: Orders

## 2012-02-27 NOTE — Progress Notes (Signed)
Subjective:     Julia Bautista is a 31 y.o. female here for a routine exam.  Current complaints: wants a screen for STI's.  Personal health questionnaire reviewed: no.   Gynecologic History Patient's last menstrual period was 02/05/2012. Contraception: condoms Last Pap:1 year prev. Results were: normal Last mammogram: n/a. Results were: n/a  Obstetric History OB History    Grav Para Term Preterm Abortions TAB SAB Ect Mult Living   4 1 1  3 2 1   1      # Outc Date GA Lbr Len/2nd Wgt Sex Del Anes PTL Lv   1 TAB            2 TAB            3 SAB            4 TRM                  Review of Systems Non contributory  Objective:    BP 128/78  Pulse 91  Temp 98.4 F (36.9 C) (Oral)  Ht 5\' 3"  (1.6 m)  Wt 165 lb 3.2 oz (74.934 kg)  BMI 29.26 kg/m2  LMP 02/05/2012  General Appearance:    Alert, cooperative, no distress, appears stated age  Head:    Normocephalic, without obvious abnormality, atraumatic  Eyes:    PERRL, conjunctiva/corneas clear, EOM's intact, fundi    benign, both eyes  Ears:    Normal TM's and external ear canals, both ears  Nose:   Nares normal, septum midline, mucosa normal, no drainage    or sinus tenderness  Throat:   Lips, mucosa, and tongue normal; teeth and gums normal  Neck:   Supple, symmetrical, trachea midline, no adenopathy;    thyroid:  no enlargement/tenderness/nodules; no carotid   bruit or JVD  Back:     Symmetric, no curvature, ROM normal, no CVA tenderness  Lungs:     Clear to auscultation bilaterally, respirations unlabored  Chest Wall:    No tenderness or deformity   Heart:    Regular rate and rhythm, S1 and S2 normal, no murmur, rub   or gallop  Breast Exam:    No tenderness, masses, or nipple abnormality  Abdomen:     Soft, non-tender, bowel sounds active all four quadrants,    no masses, no organomegaly  Genitalia:    Normal female without lesion, discharge or tenderness  Rectal:    Normal tone, normal prostate, no masses or  tenderness;   guaiac negative stool  Extremities:   Extremities normal, atraumatic, no cyanosis or edema  Pulses:   2+ and symmetric all extremities  Skin:   Skin color, texture, turgor normal, no rashes or lesions  Lymph nodes:   Cervical, supraclavicular, and axillary nodes normal  Neurologic:   CNII-XII intact, normal strength, sensation and reflexes    throughout      Assessment:    Healthy female exam.   STI screen Plan:    Contraception: condoms.   Labs: HIV, RPR, Hepatitis panel F/u PAP and cx F/u 1 year annual or sooner prn  Ibtisam Benge L. Harraway-Smith, M.D., [facility]

## 2012-02-27 NOTE — Patient Instructions (Addendum)
Pelvic Pain Pelvic pain is pain below the belly button and located between your hips. Acute pain may last a few hours or days. Chronic pelvic pain may last weeks and months. The cause may be different for different types of pain. The pain may be dull or sharp, mild or severe and can interfere with your daily activities. Write down and tell your caregiver:   Exactly where the pain is located.   If it comes and goes or is there all the time.   When it happens (with sex, urination, bowel movement, etc.)   If the pain is related to your menstrual period or stress.  Your caregiver will take a full history and do a complete physical exam and Pap test. CAUSES   Painful menstrual periods (dysmenorrhea).   Normal ovulation (Mittelschmertz) that occurs in the middle of the menstrual cycle every month.   The pelvic organs get engorged with blood just before the menstrual period (pelvic congestive syndrome).   Scar tissue from an infection or past surgery (pelvic adhesions).   Cancer of the female pelvic organs. When there is pain with cancer, it has been there for a long time.   The lining of the uterus (endometrium) abnormally grows in places like the pelvis and on the pelvic organs (endometriosis).   A form of endometriosis with the lining of the uterus present inside of the muscle tissue of the uterus (adenomyosis).   Fibroid tumor (noncancerous) in the uterus.   Bladder problems such as infection, bladder spasms of the muscle tissue of the bladder.   Intestinal problems (irritable bowel syndrome, colitis, an ulcer or gastrointestinal infection).   Polyps of the cervix or uterus.   Pregnancy in the tube (ectopic pregnancy).   The opening of the cervix is too small for the menstrual blood to flow through it (cervical stenosis).   Physical or sexual abuse (past or present).   Musculo-skeletal problems from poor posture, problems with the vertebrae of the lower back or the uterine  pelvic muscles falling (prolapse).   Psychological problems such as depression or stress.   IUD (intrauterine device) in the uterus.  DIAGNOSIS  Tests to make a diagnosis depends on the type, location, severity and what causes the pain to occur. Tests that may be needed include:  Blood tests.   Urine tests   Ultrasound.   X-rays.   CT Scan.   MRI.   Laparoscopy.   Major surgery.  TREATMENT  Treatment will depend on the cause of the pain, which includes:  Prescription or over-the-counter pain medication.   Antibiotics.   Birth control pills.   Hormone treatment.   Nerve blocking injections.   Physical therapy.   Antidepressants.   Counseling with a psychiatrist or psychologist.   Minor or major surgery.  HOME CARE INSTRUCTIONS   Only take over-the-counter or prescription medicines for pain, discomfort or fever as directed by your caregiver.   Follow your caregiver's advice to treat your pain.   Rest.   Avoid sexual intercourse if it causes the pain.   Apply warm or cold compresses (which ever works best) to the pain area.   Do relaxation exercises such as yoga or meditation.   Try acupuncture.   Avoid stressful situations.   Try group therapy.   If the pain is because of a stomach/intestinal upset, drink clear liquids, eat a bland light food diet until the symptoms go away.  SEEK MEDICAL CARE IF:   You need stronger prescription pain medication.     You develop pain with sexual intercourse.   You have pain with urination.   You develop a temperature of 102 F (38.9 C) with the pain.   You are still in pain after 4 hours of taking prescription medication for the pain.   You need depression medication.   Your IUD is causing pain and you want it removed.  SEEK IMMEDIATE MEDICAL CARE IF:  You develop very severe pain or tenderness.   You faint, have chills, severe weakness or dehydration.   You develop heavy vaginal bleeding or passing  solid tissue.   You develop a temperature of 102 F (38.9 C) with the pain.   You have blood in the urine.   You are being physically or sexually abused.   You have uncontrolled vomiting and diarrhea.   You are depressed and afraid of harming yourself or someone else.  Document Released: 08/16/2004 Document Revised: 06/28/2011 Document Reviewed: 05/13/2008 [facility] Patient Information 2012 [place], [place].Contraception Choices Contraception (birth control) is the use of any methods or devices to prevent pregnancy. Below are some methods to help avoid pregnancy. HORMONAL METHODS   Contraceptive implant. This is a thin, plastic tube containing progesterone hormone. It does not contain estrogen hormone. Your caregiver inserts the tube in the inner part of the upper arm. The tube can remain in place for up to 3 years. After 3 years, the implant must be removed. The implant prevents the ovaries from releasing an egg (ovulation), thickens the cervical mucus which prevents sperm from entering the uterus, and thins the lining of the inside of the uterus.   Progesterone-only injections. These injections are given every 3 months by your caregiver to prevent pregnancy. This synthetic progesterone hormone stops the ovaries from releasing eggs. It also thickens cervical mucus and changes the uterine lining. This makes it harder for sperm to survive in the uterus.   Birth control pills. These pills contain estrogen and progesterone hormone. They work by stopping the egg from forming in the ovary (ovulation). Birth control pills are prescribed by a caregiver.Birth control pills can also be used to treat heavy periods.   Minipill. This type of birth control pill contains only the progesterone hormone. They are taken every day of each month and must be prescribed by your caregiver.   Birth control patch. The patch contains hormones similar to those in birth control pills. It must be changed once a week and  is prescribed by a caregiver.   Vaginal ring. The ring contains hormones similar to those in birth control pills. It is left in the vagina for 3 weeks, removed for 1 week, and then a new one is put back in place. The patient must be comfortable inserting and removing the ring from the vagina.A caregiver's prescription is necessary.   Emergency contraception. Emergency contraceptives prevent pregnancy after unprotected sexual intercourse. This pill can be taken right after sex or up to 5 days after unprotected sex. It is most effective the sooner you take the pills after having sexual intercourse. Emergency contraceptive pills are available without a prescription. Check with your pharmacist. Do not use emergency contraception as your only form of birth control.  BARRIER METHODS   Female condom. This is a thin sheath (latex or rubber) that is worn over the penis during sexual intercourse. It can be used with spermicide to increase effectiveness.   Female condom. This is a soft, loose-fitting sheath that is put into the vagina before sexual intercourse.   Diaphragm. This is  a soft, latex, dome-shaped barrier that must be fitted by a caregiver. It is inserted into the vagina, along with a spermicidal jelly. It is inserted before intercourse. The diaphragm should be left in the vagina for 6 to 8 hours after intercourse.   Cervical cap. This is a round, soft, latex or plastic cup that fits over the cervix and must be fitted by a caregiver. The cap can be left in place for up to 48 hours after intercourse.   Sponge. This is a soft, circular piece of polyurethane foam. The sponge has spermicide in it. It is inserted into the vagina after wetting it and before sexual intercourse.   Spermicides. These are chemicals that kill or block sperm from entering the cervix and uterus. They come in the form of creams, jellies, suppositories, foam, or tablets. They do not require a prescription. They are inserted into  the vagina with an applicator before having sexual intercourse. The process must be repeated every time you have sexual intercourse.  INTRAUTERINE CONTRACEPTION  Intrauterine device (IUD). This is a T-shaped device that is put in a woman's uterus during a menstrual period to prevent pregnancy. There are 2 types:   Copper IUD. This type of IUD is wrapped in copper wire and is placed inside the uterus. Copper makes the uterus and fallopian tubes produce a fluid that kills sperm. It can stay in place for 10 years.   Hormone IUD. This type of IUD contains the hormone progestin (synthetic progesterone). The hormone thickens the cervical mucus and prevents sperm from entering the uterus, and it also thins the uterine lining to prevent implantation of a fertilized egg. The hormone can weaken or kill the sperm that get into the uterus. It can stay in place for 5 years.  PERMANENT METHODS OF CONTRACEPTION  Female tubal ligation. This is when the woman's fallopian tubes are surgically sealed, tied, or blocked to prevent the egg from traveling to the uterus.   Female sterilization. This is when the female has the tubes that carry sperm tied off (vasectomy).This blocks sperm from entering the vagina during sexual intercourse. After the procedure, the man can still ejaculate fluid (semen).  NATURAL PLANNING METHODS  Natural family planning. This is not having sexual intercourse or using a barrier method (condom, diaphragm, cervical cap) on days the woman could become pregnant.   Calendar method. This is keeping track of the length of each menstrual cycle and identifying when you are fertile.   Ovulation method. This is avoiding sexual intercourse during ovulation.   Symptothermal method. This is avoiding sexual intercourse during ovulation, using a thermometer and ovulation symptoms.   Post-ovulation method. This is timing sexual intercourse after you have ovulated.  Regardless of which type or method of  contraception you choose, it is important that you use condoms to protect against the transmission of sexually transmitted diseases (STDs). Talk with your caregiver about which form of contraception is most appropriate for you. Document Released: 07/09/2005 Document Revised: 06/28/2011 Document Reviewed: 11/15/2010 [facility] Patient Information 2012 [place], [place].

## 2012-02-28 LAB — HIV ANTIBODY (ROUTINE TESTING W REFLEX): HIV: NONREACTIVE

## 2012-02-28 LAB — RPR

## 2012-03-05 ENCOUNTER — Telehealth: Payer: Self-pay | Admitting: *Deleted

## 2012-03-05 NOTE — Telephone Encounter (Signed)
Pt notified and procedure explained. Pt voiced understanding. Had no questions.

## 2012-03-05 NOTE — Telephone Encounter (Signed)
Message copied by Mannie Stabile on Wed Mar 05, 2012 10:00 AM ------      Message from: Zola Button L      Created: Mon Mar 03, 2012  1:54 PM       9/12 at 2:15 pm                  ----- Message -----         From: Faith Rogue Rash, LPN         Sent: 03/03/2012   1:33 PM           To: [facility] Admin Pool            Schedule and send back to clinical pool to notify patient.             ----- Message -----         From: Willodean Rosenthal, MD         Sent: 03/03/2012   1:12 PM           To: [facility] Clinical Pool            Please schedule colposcopy.            Thx,      clh-S

## 2012-04-03 ENCOUNTER — Other Ambulatory Visit (HOSPITAL_COMMUNITY)
Admission: RE | Admit: 2012-04-03 | Discharge: 2012-04-03 | Disposition: A | Discharge status: Home / Self Care | Payer: Medicaid Other | Source: Ambulatory Visit | Attending: Obstetrics & Gynecology | Admitting: Obstetrics & Gynecology

## 2012-04-03 ENCOUNTER — Encounter: Payer: Self-pay | Admitting: Obstetrics & Gynecology

## 2012-04-03 ENCOUNTER — Ambulatory Visit (INDEPENDENT_AMBULATORY_CARE_PROVIDER_SITE_OTHER): Payer: Medicaid Other | Admitting: Obstetrics & Gynecology

## 2012-04-03 VITALS — BP 123/85 | HR 81 | Temp 98.8°F | Ht 63.0 in | Wt 163.6 lb

## 2012-04-03 DIAGNOSIS — G8918 Other acute postprocedural pain: Secondary | ICD-10-CM

## 2012-04-03 DIAGNOSIS — IMO0002 Reserved for concepts with insufficient information to code with codable children: Secondary | ICD-10-CM

## 2012-04-03 DIAGNOSIS — Z01818 Encounter for other preprocedural examination: Secondary | ICD-10-CM

## 2012-04-03 DIAGNOSIS — D069 Carcinoma in situ of cervix, unspecified: Secondary | ICD-10-CM | POA: Insufficient documentation

## 2012-04-03 DIAGNOSIS — R87612 Low grade squamous intraepithelial lesion on cytologic smear of cervix (LGSIL): Secondary | ICD-10-CM

## 2012-04-03 MED ORDER — IBUPROFEN 200 MG PO TABS
600.0000 mg | ORAL_TABLET | Freq: Once | ORAL | Status: AC
  Administered 2012-04-03: 600 mg via ORAL

## 2012-04-03 NOTE — Progress Notes (Signed)
Patient ID: Julia Bautista, female   DOB: August 15, 1980, 31 y.o.   MRN: [id number]  Chief Complaint  Patient presents with  . Colposcopy    HPI Julia Bautista is a 31 y.o. female.  Pt with no complaints currently. HPI  Indications: Pap smear on August 2013` showed: low-grade squamous intraepithelial neoplasia (LGSIL - encompassing HPV,mild dysplasia,CIN I). Previous colposcopy: n/a  02/29/12: Adequacy Reason Satisfactory for evaluation, endocervical/transformation zone component PRESENT. Diagnosis LOW GRADE SQUAMOUS INTRAEPITHELIAL LESION: CIN-1/ HPV (LSIL).  Past Medical History  Diagnosis Date  . Asthma     Past Surgical History  Procedure Date  . Finger surgery   . Cesarean section   . Rotator cuff repair     Family History  Problem Relation Age of Onset  . Diabetes Mother   . Hypertension Sister     Social History History  Substance Use Topics  . Smoking status: Current Every Day Smoker -- 0.5 packs/day for 12 years    Types: Cigarettes  . Smokeless tobacco: Never Used   Comment: 1-800- ouit # given  . Alcohol Use: 0.0 oz/week     socially    Allergies  Allergen Reactions  . Percocet (Oxycodone-Acetaminophen) Hives  . Sulfa Drugs Cross Reactors Hives    Current Outpatient Prescriptions  Medication Sig Dispense Refill  . ibuprofen (ADVIL,MOTRIN) 200 MG tablet Take 200 mg by mouth every 6 (six) hours as needed.      [name] Patient states she's taking allergy shots, but doesn't know the name        Review of Systems Review of Systems  Blood pressure 123/85, pulse 81, temperature 98.8 F (37.1 C), temperature source Oral, height 5\' 3"  (1.6 m), weight 163 lb 9.6 oz (74.208 kg), last menstrual period 03/31/2012.  Physical Exam Physical Exam  GU: EGBUS: no lesions Vagina: positive blood in vault.   COLPO:  Patient given informed consent, signed copy in the chart, time out was performed.  Placed in lithotomy position. Cervix viewed with  speculum and colposcope after application of acetic acid.   Colposcopy adequate?  Yes Acetowhite lesions?at 11-1:00 o'clock Punctation?absent Mosaicism?  Absent Abnormal vasculature?  absent Biopsies?at 11:00 o'clock ECC?yes  Patient was given post procedure instructions.  She will return in 2 weeks for results.      Specimens labelled and sent to Pathology. Return to discuss Pathology results in 2 weeks or f/u via telephone.       HARRAWAY-SMITH, Kaely Hollan 04/03/2012, 2:30 PM

## 2012-04-03 NOTE — Patient Instructions (Signed)
Cervical Dysplasia Cervical dysplasia is a condition in which a woman has abnormal changes in the cells of her cervix. The cervix is the opening to the uterus (womb) between the vagina and the uterus. These changes are called cervical dysplasia and may be the first signs of cervical cancer. These cells can be taken from the cervix during a Pap test and then looked at under a microscope. With early detection, treatment, and close follow-up care, nearly all cervical dysplasia can be cured. If untreated, the mild to moderate stages of dysplasia often grow more severe.  RISK FACTORS  The following increase the risk for cervical dysplasia.  Having had a sexually transmitted disease, including:   Chlamydia.   Human papilloma virus (HPV).   Becoming sexually active before age 18.   Having had more than 1 sexual partner.   Not using protection, such as condoms, during sexual intercourse, especially with new sexual partners.   Having had cancer of the vagina or vulva.   Having a sexual partner whose previous partner had cancer of the cervix or cervical dysplasia.   Having a sexual partner who has or has had cancer of the penis.   Having a weakened immune system (HIV, organ transplant).   Being the daughter of a woman who took DES (diethylstilbestrol) during pregnancy.   A history of cervical cancer in a woman's sister or mother.   Smoking.   Having had an abnormal Pap test in the past.  SYMPTOMS  There are usually no symptoms. If there are symptoms, they may be vague such as:  Abnormal vaginal discharge.   Bleeding between periods or following intercourse.   Bleeding during menopause.   Pain on intercourse (dyspareunia).  DIAGNOSIS   The Pap test is the best way of detecting abnormalities of the cervix.   Biopsy (removing a piece of tissue to look at under the microscope) of the cervix when the Pap test is abnormal or when the Pap test is normal, but the cervix looks abnormal.    TREATMENT  Catching and treating the changes early with Pap tests can prevent cervical cancer.  Cryotherapy freezes the abnormal cells with a steel tip instrument.   A laser can be used to remove the abnormal cells.   Loop electrocautery excision procedure (LEEP). This procedure uses a heated electrical loop to remove a cone-like portion of the cervix, including the cervical canal.   For more serious cases of cervical dysplasia, the abnormal tissue may be removed surgically by:   A cone biopsy (by cold knife, laser or LEEP). A procedure in which a portion of the center of the cervix with the cervical canal is removed.   The uterus and cervix are removed (hysterectomy).  Your caregiver will advise you regarding the need and timing of Pap tests in your follow-up. Women who have been treated for dysplasia should be closely followed with pelvic exams and Pap tests. During the first year following treatment of cervical dysplasia, Pap tests should be done every 3 to 4 months. In the second year, the schedule is every 6 months, or as recommended by your caregiver. See your caregiver for new or worsening problems. HOME CARE INSTRUCTIONS   Follow the instructions and recommendations of your caregiver regarding medicines and follow-up appointments.   Only take over-the-counter or prescription medicines for pain or discomfort as directed by your caregiver.   Cramping and pelvic discomfort may follow cryotherapy. It is not abnormal to have watery discharge for several weeks after.     Laser, cone surgery, cryotherapy or LEEP can cause a bad smelling vaginal discharge. It may also cause vaginal bleeding for a couple weeks following the procedure. The discharge may be black from the paste used to control bleeding from the cone site. This is normal.   Do not use tampons, have sexual intercourse or douche until your caregiver says it is okay.  SEEK MEDICAL CARE IF:   You develop genital warts.   You  need a prescription for pain medicine following your treatment.  SEEK IMMEDIATE MEDICAL CARE IF:   Your bleeding is heavier than a normal menstrual period.   You develop bright red bleeding, especially if you have blood clots.   You have a fever.   You have increasing cramps or pain not relieved with medicine.   You are lightheaded, unusually weak, or have fainting spells.   You have abnormal vaginal discharge.   You develop abdominal pain.  PREVENTION   The surest way to prevent cervical dysplasia is to abstain from sexual intercourse.   Practice safe sex, use condoms and have only one sex partner who does not have other sex partners.   A Pap test is done to screen for cervical cancer.   The first Pap test should be done at age 21.   Between ages 21 and 29, Pap tests are repeated every 2 years.   Beginning at age 30, you are advised to have a Pap test every 3 years as long as your past 3 Pap tests have been normal.   Some women have medical problems that increase the chance of getting cervical cancer. Talk to your caregiver about these problems. It is especially important to talk to your caregiver if a new problem develops soon after your last Pap test. In these cases, your caregiver may recommend more frequent screening and Pap tests.   The above recommendations are the same for women who have or have not gotten the vaccine for HPV (Human Papillomavirus).   If you had a hysterectomy for a problem that was not a cancer or a condition that could lead to cancer, then you no longer need Pap tests. However, even if you no longer need a Pap test, a regular exam is a good idea to make sure no other problems are starting.    If you are between ages 65 and 70, and you have had normal Pap tests going back 10 years, you no longer need Pap tests. However, even if you no longer need a Pap test, a regular exam is a good idea to make sure no other problems are starting.    If you have  had past treatment for cervical cancer or a condition that could lead to cancer, you need Pap tests and screening for cancer for at least 20 years after your treatment.   If Pap tests have been discontinued, risk factors (such as a new sexual partner) need to be re-assessed to determine if screening should be resumed.   Some women may need screenings more often if they are at high risk for cervical cancer.   Your caregiver may do additional tests including:   Colposcopy. A procedure in which a special microscope magnifies the cells and allows the provider to closely examine the cervix, vagina, and vulva.   Biopsy. A small tissue sample is taken from the cervix, vagina or vulva. This is generally done in your caregivers office.   A cone biopsy (cold knife or laser). A large tissue sample is   taken from the cervix. This procedure is usually done in an operating room under a general anesthetic. The cone often removes all abnormal tissue and so may also complete the treatment.   LEEP, also removing a circular portion of the cervix and is done in a doctors office under a local anesthetic.   Now there is a vaccine, Gardasil, that was developed to prevent the HPV'S that can cause cancer of the cervix and genital warts. It is recommended for females ages 9 to 26. It should not be given to pregnant women until more is known about its effects on the fetus. Not all cancers of the cervix are caused by the HPV. Routine gynecology exams and Pap tests should continue as recommended by your caregiver.  Document Released: 07/09/2005 Document Revised: 06/28/2011 Document Reviewed: 06/30/2008 [employer] Patient Information 2012 [employer]. 

## 2012-04-03 NOTE — Addendum Note (Signed)
Addended by: Gerome Apley on: 04/03/2012 02:59 PM   Modules accepted: Orders

## 2012-04-07 ENCOUNTER — Telehealth: Payer: Self-pay | Admitting: *Deleted

## 2012-04-07 NOTE — Telephone Encounter (Signed)
Message copied by Jill Side on Mon Apr 07, 2012  3:57 PM ------      Message from: Willodean Rosenthal      Created: Mon Apr 07, 2012  3:37 PM       I just sent a note to schedule LEEP for this pt.  Please schedule it with me.  Thx            clh-S

## 2012-04-07 NOTE — Telephone Encounter (Signed)
Called pt and informed her of abnormal biopsy from colpo and Dr. Erin Fulling recommends LEEP procedure. Pt agreed and stated that her Medicaid runs out the end of this month. She would like the procedure ASAP.  I scheduled pt for 04/09/12 @ 1415. Pt voiced understanding.

## 2012-04-09 ENCOUNTER — Encounter: Payer: Self-pay | Admitting: Obstetrics & Gynecology

## 2012-04-09 ENCOUNTER — Other Ambulatory Visit (HOSPITAL_COMMUNITY)
Admission: RE | Admit: 2012-04-09 | Discharge: 2012-04-09 | Disposition: A | Discharge status: Home / Self Care | Payer: Medicaid Other | Source: Ambulatory Visit | Attending: Obstetrics & Gynecology | Admitting: Obstetrics & Gynecology

## 2012-04-09 ENCOUNTER — Ambulatory Visit (INDEPENDENT_AMBULATORY_CARE_PROVIDER_SITE_OTHER): Payer: Medicaid Other | Admitting: Obstetrics & Gynecology

## 2012-04-09 VITALS — BP 115/80 | HR 85 | Temp 97.0°F | Resp 20 | Ht 63.0 in | Wt 163.5 lb

## 2012-04-09 DIAGNOSIS — Z01812 Encounter for preprocedural laboratory examination: Secondary | ICD-10-CM

## 2012-04-09 DIAGNOSIS — N879 Dysplasia of cervix uteri, unspecified: Secondary | ICD-10-CM | POA: Insufficient documentation

## 2012-04-09 DIAGNOSIS — N871 Moderate cervical dysplasia: Secondary | ICD-10-CM

## 2012-04-09 LAB — POCT PREGNANCY, URINE: Preg Test, Ur: NEGATIVE

## 2012-04-09 NOTE — Progress Notes (Signed)
Subjective:     Patient ID: Julia Bautista, female   DOB: 09-Mar-1981, 31 y.o.   MRN: [id number]  HPI   Review of Systems     Objective:   Physical Exam BP 115/80  Pulse 85  Temp 97 F (36.1 C) (Oral)  Resp 20  Ht 5\' 3"  (1.6 m)  Wt 163 lb 8 oz (74.163 kg)  BMI 28.96 kg/m2  LMP 03/31/2012 Pap smear and colposcopy reviewed.   Pap LOW GRADE SQUAMOUS INTRAEPITHELIAL LESION: CIN-1/ HPV (LSIL). Colpo Biopsy: 1. Cervix, biopsy - HIGH GRADE SQUAMOUS INTRAEPITHELIAL LESION, CIN-III (SEVERE DYSPLASIA) AND ADJACENT LOW GRADE SQUAMOUS INTRAEPITHELIAL LESION, CIN-I (MILD DYSPLASIA). 2. Endocervix, curettage, with brush - INFLAMED ENDOCERVICAL MUCOSA AND SQUAMOUS MUCOSA WITH ASSOCIATED MICROGLANDULAR HYPERPLASIA  Risks, benefits, alternatives, and limitations of procedure explained to patient, including pain, bleeding, infection, failure to remove abnormal tissue and failure to cure dysplasia, need for repeat procedures, damage to pelvic organs, cervical incompetence.  Role of HPV,cervical dysplasia and need for close followup was empasized. Informed written consent was obtained. All questions were answered. Time out performed.  Procedure: The patient was placed in lithotomy position and the bivalved coated speculum was placed in the patient's vagina. A grounding pad placed on the patient. Local anesthesia was administered via an intracervical block using 15cc of 2% Lidocaine with epinephrine. The suction was turned on and the large 1X Fisher Cone Biopsy Excisor on 98 Watts of cutting current was used to excise the area of decreased uptake and excise the entire transformation zone. Excellent hemostasis was achieved using roller ball coagulation set at 60 Watts coagulation current. Monsel's solution was then applied and excellent hemostasis was noted.  The speculum was removed from the vagina. Specimens were sent to pathology. The patient tolerated the procedure well. Post-operative instructions  given to patient, including instruction to seek medical attention for persistent bright red bleeding, fever, abdominal/pelvic pain, dysuria, nausea or vomiting. She was also told about the possibility of having copious yellow to black tinged discharge. She was counseled to avoid anything in the vagina (sex/douching/tampons) for 4 weeks. She has a  4 week post-operative check to review results and assess wound healing.      Assessment:     Severe dysplasia s/p LEEP    Plan:     S/p LEEP F/u 4 weeks   Nothing per vagina until cervix reevaluated in ofc.  Jakori Burkett L. Harraway-Smith, M.D., [facility]

## 2012-04-09 NOTE — Patient Instructions (Signed)
Loop Electrosurgical Excision Procedure Loop electrosurgical excision procedure (LEEP) is the removal of a portion of the lower part of the uterus (cervix). The procedure is done when there are significantly abnormal cervical cell changes. Abnormal cell changes of the cervix can lead to cancer if left in place and untreated.  The LEEP procedure itself typically only takes a few minutes. Often, it may be done in your caregiver's office. The procedure is considered safe for those who wish to get pregnant or are trying to get pregnant. Only under rare circumstances should this procedure be done if you are pregnant. LET YOUR CAREGIVER KNOW ABOUT:  Whether you are pregnant or late for your last menstrual period.   Allergies to foods or medicines.   All the medicines you are taking includingherbs, eyedrops, and over-the-counter medicines, and creams.   Use of steroids (by mouth or creams).   Previous problems with anesthetics or numbing medicine.   Previous gynecological surgery.   History of blood clots or bleeding problems.   Any recent or current vaginal infections (herpes, sexually transmitted infections).   Other health problems.  RISKS AND COMPLICATIONS  Bleeding.   Infection.   Injury to the vagina, bladder, or rectum.   Very rare obstruction of the cervical opening that causes problems during menstruation (cervical stenosis).  BEFORE THE PROCEDURE  Do not take aspirin or blood thinners (anticoagulants) for 1 week before the procedure, or as told by your caregiver.   Eat a light meal before the procedure.   Ask your caregiver about changing or stopping your regular medicines.   You may be given a pain reliever 1 or 2 hours before the procedure.  PROCEDURE   A tool (speculum) is placed in the vagina. This allows your caregiver to see the cervix.   An iodine stain is applied to the cervix to find the area of abnormal cells to be removed.   Medicine is injected to numb  the cervix (local anesthetic).    Electricity is passed through a thin wire loop which is then used to remove (cauterize) a small segment of the affected cervix.   Light electrocautery is used to seal any small blood vessels and prevent bleeding.   A paste may be applied to the cauterized area of the cervix to help prevent bleeding.   The tissue sample is sent to the lab. It is examined under the microscope.  AFTER THE PROCEDURE  Have someone drive you home.   You may have slight to moderate cramping.   You may notice a black vaginal discharge from the paste used on the cervix to prevent bleeding. This is normal.   Watch for excessive bleeding. This requires immediate medical care.   Ask when your test results will be ready. Make sure you get your test results.  Document Released: 09/29/2002 Document Revised: 06/28/2011 Document Reviewed: 12/19/2010 [facility] Patient Information 2012 [place], [place].

## 2012-05-07 ENCOUNTER — Ambulatory Visit (INDEPENDENT_AMBULATORY_CARE_PROVIDER_SITE_OTHER): Payer: Self-pay | Admitting: Advanced Practice Midwife

## 2012-05-07 ENCOUNTER — Encounter: Payer: Self-pay | Admitting: Advanced Practice Midwife

## 2012-05-07 VITALS — BP 106/78 | HR 76 | Temp 97.8°F | Ht 63.0 in | Wt 162.6 lb

## 2012-05-07 DIAGNOSIS — N898 Other specified noninflammatory disorders of vagina: Secondary | ICD-10-CM

## 2012-05-07 DIAGNOSIS — Z9889 Other specified postprocedural states: Secondary | ICD-10-CM

## 2012-05-07 NOTE — Progress Notes (Signed)
Subjective:     Patient ID: Julia Bautista, female   DOB: Dec 16, 1980, 31 y.o.   MRN: [id number]  HPI 31 y.o. [id number] presents for f/u 4 weeks after LEEP by Dr. Erin Fulling.  She denies pain, vaginal bleeding, n/v, or fever/chills.   Review of Systems Pertinent ROS in HPI    Objective:   Physical Exam BP 106/78  Pulse 76  Temp 97.8 F (36.6 C) (Oral)  Ht 5\' 3"  (1.6 m)  Wt 73.755 kg (162 lb 9.6 oz)  BMI 28.80 kg/m2  LMP 04/30/2012  Pelvic exam: Cervix pink, visually closed, without lesion, moderate amount yellow discharge, some scant dark brown blood at os, vaginal walls and external genitalia normal     Assessment:     1. Vaginal discharge   F/U 4 weeks after LEEP    Plan:     Reviewed abnormal cytology results from previous exam and normal cytology findings from LEEP with pt Discussed f/u plan with Dr Marice Potter Pap in 6 months  May resume intercourse, use of tampons

## 2012-05-08 LAB — WET PREP, GENITAL: Trich, Wet Prep: NONE SEEN

## 2012-05-10 ENCOUNTER — Other Ambulatory Visit: Payer: Self-pay | Admitting: Advanced Practice Midwife

## 2012-05-10 DIAGNOSIS — Z9889 Other specified postprocedural states: Secondary | ICD-10-CM | POA: Insufficient documentation

## 2012-05-10 MED ORDER — METRONIDAZOLE 500 MG PO TABS: 500.0000 mg | ORAL_TABLET | Freq: Two times a day (BID) | ORAL | Status: AC

## 2012-05-10 NOTE — Progress Notes (Signed)
Wet prep in Gyn clinic resulted in Moderate clue cells and pt reports increased discharge.  Prescription for Flagyl called to pt pharmacy [facility]).

## 2012-05-13 ENCOUNTER — Telehealth: Payer: Self-pay | Admitting: Medical

## 2012-05-13 NOTE — Telephone Encounter (Signed)
Patient called stating that she has had some post coital bleeding recently. She is 4 weeks s/p LEEP procedure and was told by a provider at her last visit that it was ok to resume normal activities. She would like advice about the bleeding she is experiencing.

## 2012-05-13 NOTE — Telephone Encounter (Signed)
LM for patient to return call to clinic.

## 2012-05-13 NOTE — Telephone Encounter (Signed)
Spoke with patient she had one episode of very light bleeding after sex and it resolved shortly after. No pain. I advised her to let us know if this continues to happen. Patient agrees.

## 2012-10-01 ENCOUNTER — Encounter: Payer: Self-pay | Admitting: *Deleted

## 2012-10-23 ENCOUNTER — Encounter: Payer: Self-pay | Admitting: *Deleted

## 2013-02-26 ENCOUNTER — Encounter (HOSPITAL_COMMUNITY): Payer: Self-pay | Admitting: *Deleted

## 2013-02-26 ENCOUNTER — Inpatient Hospital Stay (HOSPITAL_COMMUNITY)
Admission: AD | Admit: 2013-02-26 | Discharge: 2013-02-26 | Disposition: A | Discharge status: Home / Self Care | Payer: Medicaid Other | Source: Ambulatory Visit | Attending: Obstetrics & Gynecology | Admitting: Obstetrics & Gynecology

## 2013-02-26 DIAGNOSIS — D649 Anemia, unspecified: Secondary | ICD-10-CM | POA: Insufficient documentation

## 2013-02-26 DIAGNOSIS — N946 Dysmenorrhea, unspecified: Secondary | ICD-10-CM | POA: Insufficient documentation

## 2013-02-26 DIAGNOSIS — R109 Unspecified abdominal pain: Secondary | ICD-10-CM | POA: Insufficient documentation

## 2013-02-26 DIAGNOSIS — N926 Irregular menstruation, unspecified: Secondary | ICD-10-CM | POA: Insufficient documentation

## 2013-02-26 LAB — URINALYSIS, ROUTINE W REFLEX MICROSCOPIC
Glucose, UA: NEGATIVE mg/dL
Ketones, ur: NEGATIVE mg/dL
Leukocytes, UA: NEGATIVE
Protein, ur: NEGATIVE mg/dL
Urobilinogen, UA: 0.2 mg/dL (ref 0.0–1.0)

## 2013-02-26 LAB — CBC
Hemoglobin: 10.8 g/dL — ABNORMAL LOW (ref 12.0–15.0)
RBC: 4.26 MIL/uL (ref 3.87–5.11)

## 2013-02-26 LAB — URINE MICROSCOPIC-ADD ON

## 2013-02-26 LAB — POCT PREGNANCY, URINE: Preg Test, Ur: NEGATIVE

## 2013-02-26 MED ORDER — IBUPROFEN 600 MG PO TABS: 600.0000 mg | ORAL_TABLET | Freq: Four times a day (QID) | ORAL | Status: DC | PRN

## 2013-02-26 MED ORDER — FERROUS SULFATE 325 (65 FE) MG PO TABS: 325.0000 mg | ORAL_TABLET | Freq: Every day | ORAL | Status: DC

## 2013-02-26 NOTE — MAU Provider Note (Signed)
History     CSN: [id number]  Arrival date and time: 02/26/13 1600   First Provider Initiated Contact with Patient 02/26/13 1802      Chief Complaint  Patient presents with  . Vaginal Bleeding  . Abdominal Pain   HPI [name] is a 32 y.o. [id number] who presents to MAU today with complaint of irregular periods and lower abdominal pain. The patient states that she was 3 days early for her period last month and now 2-3 days late this month. LMP 02/23/13, but lighter than usual. Patient states she is having some lower abdominal cramping as well. She denies N/V/D or constipation, UTI symptoms or vaginal discharge. She denies birth control at this time or recently. States that she has "tried all of them."    OB History   Grav Para Term Preterm Abortions TAB SAB Ect Mult Living   4 1 1  3 2 1   1       Past Medical History  Diagnosis Date  . Asthma   . Abnormal Pap smear     Past Surgical History  Procedure Laterality Date  . Finger surgery    . Rotator cuff repair    . Cesarean section  2005  . Tubal ligation  2009  . Colposcopy  2013  . Leep  2013    Family History  Problem Relation Age of Onset  . Diabetes Mother   . Hypertension Sister     History  Substance Use Topics  . Smoking status: Current Every Day Smoker -- 0.50 packs/day for 12 years    Types: Cigarettes  . Smokeless tobacco: Never Used     Comment: 1-800- ouit # given  . Alcohol Use: 0.0 oz/week     Comment: socially    Allergies:  Allergies  Allergen Reactions  . Percocet (Oxycodone-Acetaminophen) Hives  . Sulfa Drugs Cross Reactors Hives    Prescriptions prior to admission  Medication Sig Dispense Refill  . Aspirin-Salicylamide-Caffeine (BC HEADACHE POWDER PO) Take by mouth.      Marland Kitchen ibuprofen (ADVIL,MOTRIN) 200 MG tablet Take 200 mg by mouth every 6 (six) hours as needed.      . [DISCONTINUED] NON FORMULARY Patient states she's taking allergy shots, but doesn't know the name         Review of Systems  Constitutional: Positive for malaise/fatigue. Negative for fever.  Gastrointestinal: Positive for nausea and abdominal pain. Negative for vomiting, diarrhea and constipation.  Genitourinary: Negative for dysuria, urgency and frequency.       + vaginal bleeding Neg - vaginal discharge  Neurological: Negative for dizziness, loss of consciousness and weakness.   Physical Exam   Blood pressure 121/84, pulse 78, temperature 99.1 F (37.3 C), temperature source Oral, resp. rate 18, height 5\' 2"  (1.575 m), last menstrual period 02/23/2013.  Physical Exam  Constitutional: She is oriented to person, place, and time. She appears well-developed and well-nourished. No distress.  HENT:  Head: Normocephalic and atraumatic.  Cardiovascular: Normal rate, regular rhythm and normal heart sounds.   Respiratory: Breath sounds normal. No respiratory distress.  GI: Soft. Bowel sounds are normal. She exhibits no distension and no mass. There is tenderness (very mild RLQ tenderness to palpation). There is no rebound and no guarding.  Neurological: She is alert and oriented to person, place, and time.  Skin: Skin is warm and dry. No erythema.  Psychiatric: She has a normal mood and affect.   Results for orders placed during the hospital encounter  of 02/26/13 (from the past 24 hour(s))  URINALYSIS, ROUTINE W REFLEX MICROSCOPIC     Status: Abnormal   Collection Time    02/26/13  4:25 PM      Result Value Range   Color, Urine STRAW (*) YELLOW   APPearance CLEAR  CLEAR   Specific Gravity, Urine 1.010  1.005 - 1.030   pH 6.5  5.0 - 8.0   Glucose, UA NEGATIVE  NEGATIVE mg/dL   Hgb urine dipstick LARGE (*) NEGATIVE   Bilirubin Urine NEGATIVE  NEGATIVE   Ketones, ur NEGATIVE  NEGATIVE mg/dL   Protein, ur NEGATIVE  NEGATIVE mg/dL   Urobilinogen, UA 0.2  0.0 - 1.0 mg/dL   Nitrite NEGATIVE  NEGATIVE   Leukocytes, UA NEGATIVE  NEGATIVE  URINE MICROSCOPIC-ADD ON     Status: Abnormal    Collection Time    02/26/13  4:25 PM      Result Value Range   Squamous Epithelial / LPF FEW (*) RARE   WBC, UA 0-2  <3 WBC/hpf   Bacteria, UA FEW (*) RARE  POCT PREGNANCY, URINE     Status: None   Collection Time    02/26/13  4:39 PM      Result Value Range   Preg Test, Ur NEGATIVE  NEGATIVE  CBC     Status: Abnormal   Collection Time    02/26/13  6:27 PM      Result Value Range   WBC 2.7 (*) 4.0 - 10.5 K/uL   RBC 4.26  3.87 - 5.11 MIL/uL   Hemoglobin 10.8 (*) 12.0 - 15.0 g/dL   HCT 16.1 (*) 09.6 - 04.5 %   MCV 77.5 (*) 78.0 - 100.0 fL   MCH 25.4 (*) 26.0 - 34.0 pg   MCHC 32.7  30.0 - 36.0 g/dL   RDW 40.9  81.1 - 91.4 %   Platelets 253  150 - 400 K/uL    MAU Course  Procedures None  MDM UPT - negative UA, CBC today  Assessment and Plan  A: Dysmenorrhea Anemia  P: Discharge home Patient advised to follow-up with PCP of choice for anemia and leukopenia Patient given contact information for [facility] clinic if irregular periods worsen. Advised to observe menstrual irregularities for at least another 4 months.  Patient may return to MAU as needed or if her condition were to change or worsen  Freddi Starr, PA-C  02/26/2013, 7:07 PM

## 2013-02-26 NOTE — MAU Provider Note (Signed)
Chart reviewed and agree with management and plan.  

## 2013-02-26 NOTE — MAU Note (Signed)
Pt states she started having pain since last night and has had vaginal bleeding since the 8/4/2o14

## 2013-02-26 NOTE — MAU Note (Signed)
Having an irregular period and abd pain.  Period is late and light

## 2013-04-22 ENCOUNTER — Ambulatory Visit: Payer: Medicaid Other | Admitting: Obstetrics & Gynecology

## 2013-09-22 ENCOUNTER — Emergency Department (HOSPITAL_COMMUNITY)
Admission: EM | Admit: 2013-09-22 | Discharge: 2013-09-23 | Disposition: A | Discharge status: Home / Self Care | Payer: Medicaid Other | Attending: Emergency Medicine | Admitting: Emergency Medicine

## 2013-09-22 ENCOUNTER — Encounter (HOSPITAL_COMMUNITY): Payer: Self-pay | Admitting: Emergency Medicine

## 2013-09-22 DIAGNOSIS — R599 Enlarged lymph nodes, unspecified: Secondary | ICD-10-CM | POA: Diagnosis not present

## 2013-09-22 DIAGNOSIS — R591 Generalized enlarged lymph nodes: Secondary | ICD-10-CM

## 2013-09-22 DIAGNOSIS — J45909 Unspecified asthma, uncomplicated: Secondary | ICD-10-CM | POA: Insufficient documentation

## 2013-09-22 DIAGNOSIS — R51 Headache: Secondary | ICD-10-CM | POA: Diagnosis present

## 2013-09-22 DIAGNOSIS — Z8669 Personal history of other diseases of the nervous system and sense organs: Secondary | ICD-10-CM | POA: Diagnosis not present

## 2013-09-22 DIAGNOSIS — F17200 Nicotine dependence, unspecified, uncomplicated: Secondary | ICD-10-CM | POA: Insufficient documentation

## 2013-09-22 NOTE — ED Notes (Signed)
Pt reports swollen knot on her face in front of her L ear, pt states the knot has continued to grow larger and cause increasing pain. Pt states she took BC powder for pain with mild relief. Pt a&o x4, NAD noted at this time

## 2013-09-23 MED ORDER — HYDROCODONE-ACETAMINOPHEN 5-325 MG PO TABS
1.0000 | ORAL_TABLET | ORAL | Status: DC | PRN
Start: 2013-09-23 — End: 2014-02-10

## 2013-09-23 MED ORDER — CEPHALEXIN 500 MG PO CAPS: 1000.0000 mg | ORAL_CAPSULE | Freq: Two times a day (BID) | ORAL | Status: DC

## 2013-09-23 NOTE — ED Provider Notes (Signed)
CSN: [id number]     Arrival date & time 09/22/13  2114 History   First MD Initiated Contact with Patient 09/22/13 2345     Chief Complaint  Patient presents with  . Facial Pain     (Consider location/radiation/quality/duration/timing/severity/associated sxs/prior Treatment) HPI History provided by pt.   Pt has had a painful knot that has gradually increased in size just inferior to left TMJ for the past 3 days.  Pain aggravated by palpation only.  Has had intermittent hearing impairment that she describes as muffling, as well as tinnitus over the past month.  Denies fever, ear/throat/dental pain, popping/clicking of jaw, visual changes, skin changes, other knots, weight loss, night sweats.  Denies trauma.  H/o asthma, otherwise healthy.  Past Medical History  Diagnosis Date  . Asthma   . Abnormal Pap smear    Past Surgical History  Procedure Laterality Date  . Finger surgery    . Rotator cuff repair    . Cesarean section  2005  . Tubal ligation  2009  . Colposcopy  2013  . Leep  2013   Family History  Problem Relation Age of Onset  . Diabetes Mother   . Hypertension Sister    History  Substance Use Topics  . Smoking status: Current Every Day Smoker -- 0.50 packs/day for 12 years    Types: Cigarettes  . Smokeless tobacco: Never Used     Comment: 1-800- ouit # given  . Alcohol Use: 0.0 oz/week     Comment: socially   OB History   Grav Para Term Preterm Abortions TAB SAB Ect Mult Living   4 1 1  3 2 1   1      Review of Systems  All other systems reviewed and are negative.      Allergies  Percocet and Sulfa drugs cross reactors  Home Medications   Current Outpatient Rx  Name  Route  Sig  Dispense  Refill  . Aspirin-Salicylamide-Caffeine (BC HEADACHE POWDER PO)   Oral   Take 1 Package by mouth every 4 (four) hours as needed (pain).          . ferrous sulfate 325 (65 FE) MG tablet   Oral   Take 325 mg by mouth 3 (three) times daily as needed. Pt only takes  when her cycle is going on         . ibuprofen (ADVIL,MOTRIN) 200 MG tablet   Oral   Take 800 mg by mouth every 6 (six) hours as needed.          . cephALEXin (KEFLEX) 500 MG capsule   Oral   Take 2 capsules (1,000 mg total) by mouth 2 (two) times daily.   28 capsule   0   . HYDROcodone-acetaminophen (NORCO/VICODIN) 5-325 MG per tablet   Oral   Take 1 tablet by mouth every 4 (four) hours as needed for moderate pain.   12 tablet   0    BP 112/73  Pulse 73  Temp(Src) 98.2 F (36.8 C) (Oral)  Resp 14  SpO2 100%  LMP 09/06/2013 Physical Exam  Nursing note and vitals reviewed. Constitutional: She is oriented to person, place, and time. She appears well-developed and well-nourished. No distress.  HENT:  Head: Normocephalic and atraumatic.  Mouth/Throat: Oropharynx is clear and moist. No oropharyngeal exudate.  1cm tender subq knot just inferior to L TMJ.  No overlying skin changes.  Nml TM and EAC bilaterally.  No edema/erythema/ttp L mastoid. No tenderness of TMJ  or popping/clicking/pain w/ ROM jaw.  No tenderness L mandible.  Nml dentition.    Eyes:  Normal appearance  Neck: Normal range of motion.  Posterior cervical lymphadenopathy.  No supraclavicular, axillary or inguinal lymphadenopathy.   Cardiovascular: Normal rate and regular rhythm.   Pulmonary/Chest: Effort normal and breath sounds normal. No respiratory distress.  Musculoskeletal: Normal range of motion.  Neurological: She is alert and oriented to person, place, and time.  Skin: Skin is warm and dry. No rash noted.  Psychiatric: She has a normal mood and affect. Her behavior is normal.    ED Course  Procedures (including critical care time) Labs Review Labs Reviewed - No data to display Imaging Review No results found.   EKG Interpretation None      MDM   Final diagnoses:  Lymphadenopathy   33yo F w/ asthma, otherwise healthy, presents w/ painful, non-traumatic subq mass inferior to L TMJ x 3  days.  I suspect that this is a swollen gland.  No other s/sx of infectious process.  Rest of body examined for lymphadenopathy and there are mildly enlarged posterior cervical glands only.  Suspect URI.  Discussed case w/ Dr. Fayrene FearingJames who recommends trial of abx vs. Watchful waiting and f/u.  Pt d/c'd home w/  Keflex and 12 vicodin.  Referred to ENT because though likely unrelated, she has also had intermittent muffled hearing and tinnitus x 1 month.  Return precautions discussed.     Otilio Miuatherine E Alfred Harrel, PA-C 09/23/13 2042  Otilio Miuatherine E Peony Barner, PA-C 09/23/13 2043

## 2013-09-23 NOTE — Discharge Instructions (Signed)
Take antibiotic as prescribed. Take vicodin as prescribed for severe pain.  Do not drive within four hours of taking this medication (may cause drowsiness or confusion).   Follow up with the Ear Nose and Throat doctor as well as a primary care doctor.  Call [facility] [phone]) if you do not have a primary care doctor and would like assistance with finding one.    You may return to the ER if symptoms worsen or you have any other concerns.    Lymphadenopathy Lymphadenopathy means "disease of the lymph glands." But the term is usually used to describe swollen or enlarged lymph glands, also called lymph nodes. These are the bean-shaped organs found in many locations including the neck, underarm, and groin. Lymph glands are part of the immune system, which fights infections in your body. Lymphadenopathy can occur in just one area of the body, such as the neck, or it can be generalized, with lymph node enlargement in several areas. The nodes found in the neck are the most common sites of lymphadenopathy. CAUSES  When your immune system responds to germs (such as viruses or bacteria ), infection-fighting cells and fluid build up. This causes the glands to grow in size. This is usually not something to worry about. Sometimes, the glands themselves can become infected and inflamed. This is called lymphadenitis. Enlarged lymph nodes can be caused by many diseases:  Bacterial disease, such as strep throat or a skin infection.  Viral disease, such as a common cold.  Other germs, such as lyme disease, tuberculosis, or sexually transmitted diseases.  Cancers, such as lymphoma (cancer of the lymphatic system) or leukemia (cancer of the white blood cells).  Inflammatory diseases such as lupus or rheumatoid arthritis.  Reactions to medications. Many of the diseases above are rare, but important. This is why you should see your caregiver if you have lymphadenopathy. SYMPTOMS   Swollen, enlarged lumps in  the neck, back of the head or other locations.  Tenderness.  Warmth or redness of the skin over the lymph nodes.  Fever. DIAGNOSIS  Enlarged lymph nodes are often near the source of infection. They can help healthcare providers diagnose your illness. For instance:   Swollen lymph nodes around the jaw might be caused by an infection in the mouth.  Enlarged glands in the neck often signal a throat infection.  Lymph nodes that are swollen in more than one area often indicate an illness caused by a virus. Your caregiver most likely will know what is causing your lymphadenopathy after listening to your history and examining you. Blood tests, x-rays or other tests may be needed. If the cause of the enlarged lymph node cannot be found, and it does not go away by itself, then a biopsy may be needed. Your caregiver will discuss this with you. TREATMENT  Treatment for your enlarged lymph nodes will depend on the cause. Many times the nodes will shrink to normal size by themselves, with no treatment. Antibiotics or other medicines may be needed for infection. Only take over-the-counter or prescription medicines for pain, discomfort or fever as directed by your caregiver. HOME CARE INSTRUCTIONS  Swollen lymph glands usually return to normal when the underlying medical condition goes away. If they persist, contact your health-care provider. He/she might prescribe antibiotics or other treatments, depending on the diagnosis. Take any medications exactly as prescribed. Keep any follow-up appointments made to check on the condition of your enlarged nodes.  SEEK MEDICAL CARE IF:   Swelling lasts  for more than two weeks.  You have symptoms such as weight loss, night sweats, fatigue or fever that does not go away.  The lymph nodes are hard, seem fixed to the skin or are growing rapidly.  Skin over the lymph nodes is red and inflamed. This could mean there is an infection. SEEK IMMEDIATE MEDICAL CARE IF:    Fluid starts leaking from the area of the enlarged lymph node.  You develop a fever of 102 F (38.9 C) or greater.  Severe pain develops (not necessarily at the site of a large lymph node).  You develop chest pain or shortness of breath.  You develop worsening abdominal pain. MAKE SURE YOU:   Understand these instructions.  Will watch your condition.  Will get help right away if you are not doing well or get worse. Document Released: 04/17/2008 Document Revised: 10/01/2011 Document Reviewed: 04/17/2008 [facility] Patient Information 2014 [place], [place].

## 2013-09-25 NOTE — ED Provider Notes (Signed)
Medical screening examination/treatment/procedure(s) were performed by non-physician practitioner and as supervising physician I was immediately available for consultation/collaboration.   EKG Interpretation None        Audree CamelScott T Marcia Hartwell, MD 09/25/13 1407

## 2014-02-10 ENCOUNTER — Encounter (HOSPITAL_COMMUNITY): Payer: Self-pay | Admitting: General Practice

## 2014-02-10 ENCOUNTER — Inpatient Hospital Stay (HOSPITAL_COMMUNITY)
Admission: AD | Admit: 2014-02-10 | Discharge: 2014-02-10 | Disposition: A | Discharge status: Home / Self Care | Payer: Medicaid Other | Source: Ambulatory Visit | Attending: Obstetrics & Gynecology | Admitting: Obstetrics & Gynecology

## 2014-02-10 DIAGNOSIS — Z8249 Family history of ischemic heart disease and other diseases of the circulatory system: Secondary | ICD-10-CM | POA: Insufficient documentation

## 2014-02-10 DIAGNOSIS — N949 Unspecified condition associated with female genital organs and menstrual cycle: Secondary | ICD-10-CM | POA: Insufficient documentation

## 2014-02-10 DIAGNOSIS — R102 Pelvic and perineal pain: Secondary | ICD-10-CM

## 2014-02-10 DIAGNOSIS — J45909 Unspecified asthma, uncomplicated: Secondary | ICD-10-CM | POA: Insufficient documentation

## 2014-02-10 DIAGNOSIS — R109 Unspecified abdominal pain: Secondary | ICD-10-CM | POA: Insufficient documentation

## 2014-02-10 DIAGNOSIS — F17200 Nicotine dependence, unspecified, uncomplicated: Secondary | ICD-10-CM | POA: Insufficient documentation

## 2014-02-10 DIAGNOSIS — Z833 Family history of diabetes mellitus: Secondary | ICD-10-CM | POA: Diagnosis not present

## 2014-02-10 LAB — URINALYSIS, ROUTINE W REFLEX MICROSCOPIC
BILIRUBIN URINE: NEGATIVE
Glucose, UA: NEGATIVE mg/dL
HGB URINE DIPSTICK: NEGATIVE
KETONES UR: NEGATIVE mg/dL
NITRITE: NEGATIVE
Protein, ur: NEGATIVE mg/dL
Specific Gravity, Urine: 1.015 (ref 1.005–1.030)
UROBILINOGEN UA: 0.2 mg/dL (ref 0.0–1.0)
pH: 7.5 (ref 5.0–8.0)

## 2014-02-10 LAB — WET PREP, GENITAL
CLUE CELLS WET PREP: NONE SEEN
Trich, Wet Prep: NONE SEEN
YEAST WET PREP: NONE SEEN

## 2014-02-10 LAB — CBC
HCT: 34.6 % — ABNORMAL LOW (ref 36.0–46.0)
HEMOGLOBIN: 11.4 g/dL — AB (ref 12.0–15.0)
MCH: 26.3 pg (ref 26.0–34.0)
MCHC: 32.9 g/dL (ref 30.0–36.0)
MCV: 79.7 fL (ref 78.0–100.0)
PLATELETS: 189 10*3/uL (ref 150–400)
RBC: 4.34 MIL/uL (ref 3.87–5.11)
RDW: 12.5 % (ref 11.5–15.5)
WBC: 3.7 10*3/uL — AB (ref 4.0–10.5)

## 2014-02-10 LAB — URINE MICROSCOPIC-ADD ON

## 2014-02-10 LAB — POCT PREGNANCY, URINE: Preg Test, Ur: NEGATIVE

## 2014-02-10 MED ORDER — TRAMADOL HCL 50 MG PO TABS: 50.0000 mg | ORAL_TABLET | Freq: Four times a day (QID) | ORAL | Status: DC | PRN

## 2014-02-10 MED ORDER — IBUPROFEN 800 MG PO TABS: 800.0000 mg | ORAL_TABLET | Freq: Three times a day (TID) | ORAL | Status: DC

## 2014-02-10 NOTE — MAU Provider Note (Signed)
History     CSN: [id number]  Arrival date and time: 02/10/14 1351   First Provider Initiated Contact with Patient 02/10/14 1644      Chief Complaint  Patient presents with  . Possible Pregnancy  . Abdominal Pain   HPI Comments: Julia Bautista 33 y.o. [id number] presents to MAU for right sided abdominal pain x 5 days. Pt reports it "feels like ovulating." Reports 5/10 pain that has been constant "nagging, achy and sharp but not crampy" except for when she takes Ibuprofen 400 mg which relieves her pain. Pt reports sitting up aggravates her pain and she is unable to get comfortable lying on either side or her back. Denies any nausea,vomiting, vaginal bleeding. Sexual intercourse in not painful. No current birth control.  LMP was 01/24/14. Has a history of an ectopic pregnancy for which she received her care from [facility]. She is concerned today for another ectopic. Pt would like to re-establish care with the [facility].    Possible Pregnancy Associated symptoms include abdominal pain and nausea. Pertinent negatives include no vomiting.  Abdominal Pain Associated symptoms include nausea. Pertinent negatives include no constipation, diarrhea or vomiting.      Past Medical History  Diagnosis Date  . Asthma   . Abnormal Pap smear     Past Surgical History  Procedure Laterality Date  . Finger surgery    . Rotator cuff repair    . Cesarean section  2005  . Tubal ligation  2009  . Colposcopy  2013  . Leep  2013    Family History  Problem Relation Age of Onset  . Diabetes Mother   . Hypertension Sister     History  Substance Use Topics  . Smoking status: Current Every Day Smoker -- 0.50 packs/day for 12 years    Types: Cigarettes  . Smokeless tobacco: Never Used     Comment: 1-800- ouit # given  . Alcohol Use: 0.0 oz/week     Comment: socially    Allergies:  Allergies  Allergen Reactions  . Percocet [Oxycodone-Acetaminophen] Hives  . Sulfa Drugs Cross Reactors  Hives    Prescriptions prior to admission  Medication Sig Dispense Refill  . Aspirin-Salicylamide-Caffeine (BC HEADACHE POWDER PO) Take 1 Package by mouth every 4 (four) hours as needed (pain).       . cephALEXin (KEFLEX) 500 MG capsule Take 2 capsules (1,000 mg total) by mouth 2 (two) times daily.  28 capsule  0  . ferrous sulfate 325 (65 FE) MG tablet Take 325 mg by mouth 3 (three) times daily as needed. Pt only takes when her cycle is going on      . HYDROcodone-acetaminophen (NORCO/VICODIN) 5-325 MG per tablet Take 1 tablet by mouth every 4 (four) hours as needed for moderate pain.  12 tablet  0  . ibuprofen (ADVIL,MOTRIN) 200 MG tablet Take 800 mg by mouth every 6 (six) hours as needed.         Review of Systems  Constitutional: Negative.   HENT: Negative.   Eyes: Negative.   Respiratory: Negative.   Cardiovascular: Negative.   Gastrointestinal: Positive for nausea and abdominal pain. Negative for vomiting, diarrhea and constipation.  Genitourinary: Negative.   Musculoskeletal: Negative.   Skin: Negative.   Neurological: Negative.   Psychiatric/Behavioral: Negative.    Physical Exam   Blood pressure 116/75, pulse 77, temperature 98.9 F (37.2 C), temperature source Oral, resp. rate 16, height 5\' 3"  (1.6 m), weight 72.757 kg (160 lb 6.4 oz),  last menstrual period 01/24/2014, SpO2 99.00%.  Physical Exam  Constitutional: She is oriented to person, place, and time. She appears well-developed and well-nourished. No distress.  Pt lying on stretcher laughing and conversant with phlebotomist at bedside.  Cardiovascular: Normal rate, regular rhythm and normal heart sounds.   Respiratory: Effort normal and breath sounds normal.  GI: Soft. There is tenderness in the right lower quadrant and suprapubic area.  Genitourinary: Mass: .  Genital: no lesions or masses Vaginal:scant thick white discharge  Cervix:closed/parous Bimanual: fornix tenderness     Musculoskeletal: Normal range  of motion.  Neurological: She is alert and oriented to person, place, and time.  Skin: Skin is warm and dry.  Psychiatric: She has a normal mood and affect. Her behavior is normal. Judgment and thought content normal.   Results for orders placed during the hospital encounter of 02/10/14 (from the past 24 hour(s))  URINALYSIS, ROUTINE W REFLEX MICROSCOPIC     Status: Abnormal   Collection Time    02/10/14  2:10 PM      Result Value Ref Range   Color, Urine YELLOW  YELLOW   APPearance CLOUDY (*) CLEAR   Specific Gravity, Urine 1.015  1.005 - 1.030   pH 7.5  5.0 - 8.0   Glucose, UA NEGATIVE  NEGATIVE mg/dL   Hgb urine dipstick NEGATIVE  NEGATIVE   Bilirubin Urine NEGATIVE  NEGATIVE   Ketones, ur NEGATIVE  NEGATIVE mg/dL   Protein, ur NEGATIVE  NEGATIVE mg/dL   Urobilinogen, UA 0.2  0.0 - 1.0 mg/dL   Nitrite NEGATIVE  NEGATIVE   Leukocytes, UA MODERATE (*) NEGATIVE  URINE MICROSCOPIC-ADD ON     Status: Abnormal   Collection Time    02/10/14  2:10 PM      Result Value Ref Range   Squamous Epithelial / LPF MANY (*) RARE   WBC, UA 0-2  <3 WBC/hpf   Bacteria, UA FEW (*) RARE  POCT PREGNANCY, URINE     Status: None   Collection Time    02/10/14  2:25 PM      Result Value Ref Range   Preg Test, Ur NEGATIVE  NEGATIVE  CBC     Status: Abnormal   Collection Time    02/10/14  4:47 PM      Result Value Ref Range   WBC 3.7 (*) 4.0 - 10.5 K/uL   RBC 4.34  3.87 - 5.11 MIL/uL   Hemoglobin 11.4 (*) 12.0 - 15.0 g/dL   HCT 16.134.6 (*) 09.636.0 - 04.546.0 %   MCV 79.7  78.0 - 100.0 fL   MCH 26.3  26.0 - 34.0 pg   MCHC 32.9  30.0 - 36.0 g/dL   RDW 40.912.5  81.111.5 - 91.415.5 %   Platelets 189  150 - 400 K/uL    MAU Course  Procedures  MDM urinalysis Urine pregnancy test cbc Imaging offered and pt declined d/t multiple imaging in past and radiation risk Assessment and Plan  A: pelvic pain  P: Pain control: Motrin 800 mg TID prn,  Ultram 50 mg for breakthrough  Pelvic rest [facility]  Note by Barnie DelErin Zamora, FNP Student   Leana Springston, Rubbie BattiestLinda Miller 02/10/2014, 4:52 PM

## 2014-02-10 NOTE — MAU Note (Signed)
Patient states she has been having lower abdominal pain since 7-17 that causes her back to hurt. Has nausea, no vomiting. Denies bleeding or discharge.

## 2014-02-10 NOTE — Discharge Instructions (Signed)
Pelvic Pain, Female Female pelvic pain can be caused by many different things and start from a variety of places. Pelvic pain refers to pain that is located in the lower half of the abdomen and between your hips. The pain may occur over a short period of time (acute) or may be reoccurring (chronic). The cause of pelvic pain may be related to disorders affecting the female reproductive organs (gynecologic), but it may also be related to the bladder, kidney stones, an intestinal complication, or muscle or skeletal problems. Getting help right away for pelvic pain is important, especially if there has been severe, sharp, or a sudden onset of unusual pain. It is also important to get help right away because some types of pelvic pain can be life threatening.  CAUSES  Below are only some of the causes of pelvic pain. The causes of pelvic pain can be in one of several categories.   Gynecologic.  Pelvic inflammatory disease.  Sexually transmitted infection.  Ovarian cyst or a twisted ovarian ligament (ovarian torsion).  Uterine lining that grows outside the uterus (endometriosis).  Fibroids, cysts, or tumors.  Ovulation.  Pregnancy.  Pregnancy that occurs outside the uterus (ectopic pregnancy).  Miscarriage.  Labor.  Abruption of the placenta or ruptured uterus.  Infection.  Uterine infection (endometritis).  Bladder infection.  Diverticulitis.  Miscarriage related to a uterine infection (septic abortion).  Bladder.  Inflammation of the bladder (cystitis).  Kidney stone(s).  Gastrointenstinal.  Constipation.  Diverticulitis.  Neurologic.  Trauma.  Feeling pelvic pain because of mental or emotional causes (psychosomatic).  Cancers of the bowel or pelvis. EVALUATION  Your caregiver will want to take a careful history of your concerns. This includes recent changes in your health, a careful gynecologic history of your periods (menses), and a sexual history. Obtaining  your family history and medical history is also important. Your caregiver may suggest a pelvic exam. A pelvic exam will help identify the location and severity of the pain. It also helps in the evaluation of which organ system may be involved. In order to identify the cause of the pelvic pain and be properly treated, your caregiver may order tests. These tests may include:   A pregnancy test.  Pelvic ultrasonography.  An X-ray exam of the abdomen.  A urinalysis or evaluation of vaginal discharge.  Blood tests. HOME CARE INSTRUCTIONS   Only take over-the-counter or prescription medicines for pain, discomfort, or fever as directed by your caregiver.   Rest as directed by your caregiver.   Eat a balanced diet.   Drink enough fluids to make your urine clear or pale yellow, or as directed.   Avoid sexual intercourse if it causes pain.   Apply warm or cold compresses to the lower abdomen depending on which one helps the pain.   Avoid stressful situations.   Keep a journal of your pelvic pain. Write down when it started, where the pain is located, and if there are things that seem to be associated with the pain, such as food or your menstrual cycle.  Follow up with your caregiver as directed.  SEEK MEDICAL CARE IF:  Your medicine does not help your pain.  You have abnormal vaginal discharge. SEEK IMMEDIATE MEDICAL CARE IF:   You have heavy bleeding from the vagina.   Your pelvic pain increases.   You feel lightheaded or faint.   You have chills.   You have pain with urination or blood in your urine.   You have uncontrolled   diarrhea or vomiting.   You have a fever or persistent symptoms for more than 3 days.  You have a fever and your symptoms suddenly get worse.   You are being physically or sexually abused.  MAKE SURE YOU:  Understand these instructions.  Will watch your condition.  Will get help if you are not doing well or get worse. Document  Released: 06/05/2004 Document Revised: 01/08/2012 Document Reviewed: 10/29/2011 ExitCare Patient Information 2015 [employer]. This information is not intended to replace advice given to you by your health care provider. Make sure you discuss any questions you have with your health care provider.  

## 2014-02-11 LAB — GC/CHLAMYDIA PROBE AMP
CT PROBE, AMP APTIMA: NEGATIVE
GC Probe RNA: NEGATIVE

## 2014-02-11 NOTE — MAU Provider Note (Signed)
Attestation of Attending Supervision of Advanced Practitioner (PA/CNM/NP): Evaluation and management procedures were performed by the Advanced Practitioner under my supervision and collaboration.  I have reviewed the Advanced Practitioner's note and chart, and I agree with the management and plan.  Margaret Staggs, MD, FACOG Attending Obstetrician & Gynecologist Faculty Practice, [facility]   

## 2014-04-25 ENCOUNTER — Encounter (HOSPITAL_COMMUNITY): Payer: Self-pay | Admitting: Emergency Medicine

## 2014-04-25 ENCOUNTER — Emergency Department (INDEPENDENT_AMBULATORY_CARE_PROVIDER_SITE_OTHER)
Admission: EM | Admit: 2014-04-25 | Discharge: 2014-04-25 | Disposition: A | Discharge status: Home / Self Care | Payer: Medicaid Other | Source: Home / Self Care | Attending: Family Medicine | Admitting: Family Medicine

## 2014-04-25 DIAGNOSIS — N390 Urinary tract infection, site not specified: Secondary | ICD-10-CM

## 2014-04-25 LAB — POCT URINALYSIS DIP (DEVICE)
BILIRUBIN URINE: NEGATIVE
GLUCOSE, UA: NEGATIVE mg/dL
HGB URINE DIPSTICK: NEGATIVE
Ketones, ur: NEGATIVE mg/dL
NITRITE: NEGATIVE
PH: 7 (ref 5.0–8.0)
Protein, ur: NEGATIVE mg/dL
Specific Gravity, Urine: 1.015 (ref 1.005–1.030)
UROBILINOGEN UA: 1 mg/dL (ref 0.0–1.0)

## 2014-04-25 LAB — POCT PREGNANCY, URINE: Preg Test, Ur: NEGATIVE

## 2014-04-25 MED ORDER — CEPHALEXIN 500 MG PO CAPS: 500.0000 mg | ORAL_CAPSULE | Freq: Four times a day (QID) | ORAL | Status: DC

## 2014-04-25 NOTE — Discharge Instructions (Signed)
Take all of medicine as directed, drink lots of fluids, see your doctor if further problems. °

## 2014-04-25 NOTE — ED Notes (Signed)
Patient c/o burning after urination, and bladder pressure x 2 weeks. Patient reports that she tried using cranberry juice, crystal, and ice water with no mild relief. Patient denies fever or discharge. Patient is alert and oriented and in NAD.

## 2014-04-25 NOTE — ED Provider Notes (Signed)
CSN: [id number]     Arrival date & time 04/25/14  1028 History   First MD Initiated Contact with Patient 04/25/14 1053     Chief Complaint  Patient presents with  . Urinary Tract Infection   (Consider location/radiation/quality/duration/timing/severity/associated sxs/prior Treatment) Patient is a 33 y.o. female presenting with urinary tract infection. The history is provided by the patient.  Urinary Tract Infection This is a new problem. The current episode started more than 1 week ago (2 weeks of sx.). Pertinent negatives include no abdominal pain.    Past Medical History  Diagnosis Date  . Asthma   . Abnormal Pap smear    Past Surgical History  Procedure Laterality Date  . Finger surgery    . Rotator cuff repair    . Cesarean section  2005  . Tubal ligation  2009  . Colposcopy  2013  . Leep  2013   Family History  Problem Relation Age of Onset  . Diabetes Mother   . Hypertension Sister    History  Substance Use Topics  . Smoking status: Current Every Day Smoker -- 0.50 packs/day for 12 years    Types: Cigarettes  . Smokeless tobacco: Never Used     Comment: 1-800- ouit # given  . Alcohol Use: 0.0 oz/week     Comment: socially   OB History   Grav Para Term Preterm Abortions TAB SAB Ect Mult Living   4 1 1  3 2 1   1      Review of Systems  Constitutional: Negative.   Gastrointestinal: Negative for abdominal pain.  Genitourinary: Positive for dysuria and urgency.    Allergies  Darvon; Percocet; and Sulfa drugs cross reactors  Home Medications   Prior to Admission medications   Medication Sig Start Date End Date Taking? Authorizing Provider  cephALEXin (KEFLEX) 500 MG capsule Take 1 capsule (500 mg total) by mouth 4 (four) times daily. Take all of medicine and drink lots of fluids 04/25/14   Linna HoffJames D Kindl, MD  ferrous sulfate 325 (65 FE) MG tablet Take 325 mg by mouth 3 (three) times daily as needed. Pt only takes when her cycle is going on    Historical  Provider, MD  ibuprofen (ADVIL,MOTRIN) 800 MG tablet Take 1 tablet (800 mg total) by mouth 3 (three) times daily. 02/10/14   Delbert PhenixLinda M Barefoot, NP  traMADol (ULTRAM) 50 MG tablet Take 1 tablet (50 mg total) by mouth every 6 (six) hours as needed. 02/10/14   Delbert PhenixLinda M Barefoot, NP   BP 125/87  Pulse 70  Temp(Src) 98.2 F (36.8 C) (Oral)  Resp 14  SpO2 100%  LMP 04/17/2014 Physical Exam  Nursing note and vitals reviewed. Constitutional: She is oriented to person, place, and time. She appears well-developed and well-nourished.  Abdominal: Soft. Bowel sounds are normal.  Neurological: She is alert and oriented to person, place, and time.  Skin: Skin is warm and dry.    ED Course  Procedures (including critical care time) Labs Review Labs Reviewed  POCT URINALYSIS DIP (DEVICE) - Abnormal; Notable for the following:    Leukocytes, UA SMALL (*)    All other components within normal limits  POCT PREGNANCY, URINE    Imaging Review No results found.   MDM   1. UTI (lower urinary tract infection)        Linna HoffJames D Kindl, MD 04/27/14 1146

## 2014-05-24 ENCOUNTER — Encounter (HOSPITAL_COMMUNITY): Payer: Self-pay | Admitting: Emergency Medicine

## 2014-06-21 ENCOUNTER — Emergency Department (HOSPITAL_COMMUNITY)
Admission: EM | Admit: 2014-06-21 | Discharge: 2014-06-21 | Disposition: A | Discharge status: Home / Self Care | Payer: Medicaid Other | Attending: Emergency Medicine | Admitting: Emergency Medicine

## 2014-06-21 ENCOUNTER — Emergency Department (HOSPITAL_COMMUNITY): Payer: Medicaid Other

## 2014-06-21 ENCOUNTER — Encounter (HOSPITAL_COMMUNITY): Payer: Self-pay | Admitting: Emergency Medicine

## 2014-06-21 DIAGNOSIS — R112 Nausea with vomiting, unspecified: Secondary | ICD-10-CM | POA: Diagnosis not present

## 2014-06-21 DIAGNOSIS — Z720 Tobacco use: Secondary | ICD-10-CM | POA: Insufficient documentation

## 2014-06-21 DIAGNOSIS — N888 Other specified noninflammatory disorders of cervix uteri: Secondary | ICD-10-CM | POA: Diagnosis not present

## 2014-06-21 DIAGNOSIS — R102 Pelvic and perineal pain: Secondary | ICD-10-CM

## 2014-06-21 DIAGNOSIS — J45909 Unspecified asthma, uncomplicated: Secondary | ICD-10-CM | POA: Diagnosis not present

## 2014-06-21 DIAGNOSIS — Z791 Long term (current) use of non-steroidal anti-inflammatories (NSAID): Secondary | ICD-10-CM | POA: Diagnosis not present

## 2014-06-21 DIAGNOSIS — Z3202 Encounter for pregnancy test, result negative: Secondary | ICD-10-CM | POA: Insufficient documentation

## 2014-06-21 DIAGNOSIS — Z792 Long term (current) use of antibiotics: Secondary | ICD-10-CM | POA: Diagnosis not present

## 2014-06-21 DIAGNOSIS — R1030 Lower abdominal pain, unspecified: Secondary | ICD-10-CM

## 2014-06-21 DIAGNOSIS — Z79899 Other long term (current) drug therapy: Secondary | ICD-10-CM | POA: Insufficient documentation

## 2014-06-21 DIAGNOSIS — R1031 Right lower quadrant pain: Secondary | ICD-10-CM | POA: Diagnosis present

## 2014-06-21 HISTORY — DX: Other acne: L70.8

## 2014-06-21 LAB — WET PREP, GENITAL
Clue Cells Wet Prep HPF POC: NONE SEEN
Trich, Wet Prep: NONE SEEN
WBC, Wet Prep HPF POC: NONE SEEN
Yeast Wet Prep HPF POC: NONE SEEN

## 2014-06-21 LAB — URINALYSIS, ROUTINE W REFLEX MICROSCOPIC
Bilirubin Urine: NEGATIVE
Glucose, UA: NEGATIVE mg/dL
Hgb urine dipstick: NEGATIVE
Ketones, ur: NEGATIVE mg/dL
LEUKOCYTES UA: NEGATIVE
NITRITE: NEGATIVE
PH: 6.5 (ref 5.0–8.0)
Protein, ur: NEGATIVE mg/dL
SPECIFIC GRAVITY, URINE: 1.024 (ref 1.005–1.030)
UROBILINOGEN UA: 0.2 mg/dL (ref 0.0–1.0)

## 2014-06-21 LAB — CBC WITH DIFFERENTIAL/PLATELET
BASOS ABS: 0 10*3/uL (ref 0.0–0.1)
Basophils Relative: 0 % (ref 0–1)
EOS PCT: 2 % (ref 0–5)
Eosinophils Absolute: 0.1 10*3/uL (ref 0.0–0.7)
HCT: 34.1 % — ABNORMAL LOW (ref 36.0–46.0)
Hemoglobin: 11.1 g/dL — ABNORMAL LOW (ref 12.0–15.0)
Lymphocytes Relative: 24 % (ref 12–46)
Lymphs Abs: 0.8 10*3/uL (ref 0.7–4.0)
MCH: 26.1 pg (ref 26.0–34.0)
MCHC: 32.6 g/dL (ref 30.0–36.0)
MCV: 80 fL (ref 78.0–100.0)
Monocytes Absolute: 0.2 10*3/uL (ref 0.1–1.0)
Monocytes Relative: 6 % (ref 3–12)
NEUTROS ABS: 2.2 10*3/uL (ref 1.7–7.7)
Neutrophils Relative %: 68 % (ref 43–77)
Platelets: 194 10*3/uL (ref 150–400)
RBC: 4.26 MIL/uL (ref 3.87–5.11)
RDW: 12.5 % (ref 11.5–15.5)
WBC: 3.2 10*3/uL — ABNORMAL LOW (ref 4.0–10.5)

## 2014-06-21 LAB — COMPREHENSIVE METABOLIC PANEL
ALBUMIN: 3.6 g/dL (ref 3.5–5.2)
ALT: <5 U/L (ref 0–35)
ANION GAP: 11 (ref 5–15)
AST: 12 U/L (ref 0–37)
Alkaline Phosphatase: 31 U/L — ABNORMAL LOW (ref 39–117)
BUN: 9 mg/dL (ref 6–23)
CALCIUM: 8.8 mg/dL (ref 8.4–10.5)
CO2: 24 mEq/L (ref 19–32)
Chloride: 103 mEq/L (ref 96–112)
Creatinine, Ser: 0.82 mg/dL (ref 0.50–1.10)
GFR calc non Af Amer: >90 mL/min (ref >90)
GLUCOSE: 104 mg/dL — AB (ref 70–99)
POTASSIUM: 4.2 meq/L (ref 3.7–5.3)
Sodium: 138 mEq/L (ref 137–147)
TOTAL PROTEIN: 6.5 g/dL (ref 6.0–8.3)
Total Bilirubin: <0.2 mg/dL — ABNORMAL LOW (ref 0.3–1.2)

## 2014-06-21 LAB — POC URINE PREG, ED: PREG TEST UR: NEGATIVE

## 2014-06-21 MED ORDER — LIDOCAINE HCL (PF) 1 % IJ SOLN
5.0000 mL | Freq: Once | INTRAMUSCULAR | Status: AC
  Administered 2014-06-21: 2.1 mL
  Filled 2014-06-21: qty 5

## 2014-06-21 MED ORDER — HYDROCODONE-ACETAMINOPHEN 5-325 MG PO TABS: 1.0000 | ORAL_TABLET | Freq: Four times a day (QID) | ORAL | Status: DC | PRN

## 2014-06-21 MED ORDER — CEFTRIAXONE SODIUM 250 MG IJ SOLR
250.0000 mg | Freq: Once | INTRAMUSCULAR | Status: AC
  Administered 2014-06-21: 250 mg via INTRAMUSCULAR
  Filled 2014-06-21: qty 250

## 2014-06-21 MED ORDER — IOHEXOL 300 MG/ML  SOLN
100.0000 mL | Freq: Once | INTRAMUSCULAR | Status: AC | PRN
  Administered 2014-06-21: 100 mL via INTRAVENOUS

## 2014-06-21 MED ORDER — MORPHINE SULFATE 4 MG/ML IJ SOLN
4.0000 mg | Freq: Once | INTRAMUSCULAR | Status: AC
  Administered 2014-06-21: 4 mg via INTRAVENOUS
  Filled 2014-06-21: qty 1

## 2014-06-21 MED ORDER — ONDANSETRON HCL 4 MG/2ML IJ SOLN
4.0000 mg | Freq: Once | INTRAMUSCULAR | Status: AC
  Administered 2014-06-21: 4 mg via INTRAVENOUS
  Filled 2014-06-21: qty 2

## 2014-06-21 MED ORDER — DOXYCYCLINE HYCLATE 100 MG PO CAPS: 100.0000 mg | ORAL_CAPSULE | Freq: Two times a day (BID) | ORAL | Status: DC

## 2014-06-21 MED ORDER — IOHEXOL 300 MG/ML  SOLN
25.0000 mL | INTRAMUSCULAR | Status: AC
  Administered 2014-06-21: 25 mL via ORAL

## 2014-06-21 NOTE — ED Provider Notes (Signed)
CSN: [id number]     Arrival date & time 06/21/14  0450 History   First MD Initiated Contact with Patient 06/21/14 0458     Chief Complaint  Patient presents with  . Abdominal Pain     (Consider location/radiation/quality/duration/timing/severity/associated sxs/prior Treatment) Patient is a 33 y.o. female presenting with abdominal pain. The history is provided by the patient. No language interpreter was used.  Abdominal Pain Pain location:  RLQ Pain quality: aching   Associated symptoms: nausea and vomiting   Associated symptoms: no chills, no diarrhea, no dysuria, no fever, no vaginal bleeding and no vaginal discharge   Associated symptoms comment:  RLQ abdominal pain that started last night and has progressed through the night. It is associated with nausea and vomiting. No known fever. No diarrhea. She denies dysuria, vaginal discharge or abnormal vaginal bleeding.    Past Medical History  Diagnosis Date  . Asthma   . Abnormal Pap smear   . Other acne     ectopic pregnancy.    Past Surgical History  Procedure Laterality Date  . Finger surgery    . Rotator cuff repair    . Cesarean section  2005  . Tubal ligation  2009  . Colposcopy  2013  . Leep  2013   Family History  Problem Relation Age of Onset  . Diabetes Mother   . Hypertension Sister    History  Substance Use Topics  . Smoking status: Current Every Day Smoker -- 0.50 packs/day for 12 years    Types: Cigarettes  . Smokeless tobacco: Never Used     Comment: 1-800- ouit # given  . Alcohol Use: 0.0 oz/week     Comment: socially   OB History    Gravida Para Term Preterm AB TAB SAB Ectopic Multiple Living   4 1 1  3 2 1   1      Review of Systems  Constitutional: Negative for fever and chills.  HENT: Negative.   Respiratory: Negative.   Cardiovascular: Negative.   Gastrointestinal: Positive for nausea, vomiting and abdominal pain. Negative for diarrhea.  Genitourinary: Negative.  Negative for dysuria,  vaginal bleeding, vaginal discharge and pelvic pain.  Musculoskeletal: Negative.   Skin: Negative.   Neurological: Negative.       Allergies  Darvon; Percocet; and Sulfa drugs cross reactors  Home Medications   Prior to Admission medications   Medication Sig Start Date End Date Taking? Authorizing Provider  cephALEXin (KEFLEX) 500 MG capsule Take 1 capsule (500 mg total) by mouth 4 (four) times daily. Take all of medicine and drink lots of fluids 04/25/14   Linna HoffJames D Kindl, MD  ferrous sulfate 325 (65 FE) MG tablet Take 325 mg by mouth 3 (three) times daily as needed. Pt only takes when her cycle is going on    Historical Provider, MD  ibuprofen (ADVIL,MOTRIN) 800 MG tablet Take 1 tablet (800 mg total) by mouth 3 (three) times daily. 02/10/14   Delbert PhenixLinda M Barefoot, NP  traMADol (ULTRAM) 50 MG tablet Take 1 tablet (50 mg total) by mouth every 6 (six) hours as needed. 02/10/14   Delbert PhenixLinda M Barefoot, NP   BP 103/66 mmHg  Pulse 69  Temp(Src) 97.7 F (36.5 C) (Oral)  Resp 14  Ht 5\' 2"  (1.575 m)  Wt 140 lb (63.504 kg)  BMI 25.60 kg/m2  SpO2 100%  LMP 05/12/2014 (Exact Date) Physical Exam  Constitutional: She is oriented to person, place, and time. She appears well-developed and well-nourished. No distress.  Neck: Normal range of motion. Neck supple.  Pulmonary/Chest: Effort normal.  Abdominal: Soft. There is tenderness. There is no rebound and no guarding.  RLQ tenderness without rebound. Minimal guarding.   Musculoskeletal: Normal range of motion.  Neurological: She is alert and oriented to person, place, and time.  Skin: Skin is warm and dry.  Psychiatric: She has a normal mood and affect.    ED Course  Procedures (including critical care time) Labs Review Labs Reviewed  CBC WITH DIFFERENTIAL  COMPREHENSIVE METABOLIC PANEL  URINALYSIS, ROUTINE W REFLEX MICROSCOPIC  POC URINE PREG, ED    Imaging Review No results found.   EKG Interpretation None      MDM   Final  diagnoses:  None    1. RLQ abdominal pain  Plan: r/o appy. CT pending. [facility].    Arnoldo HookerShari A Gwynne Kemnitz, PA-C 06/21/14 [id number]  Olivia Mackielga M Otter, MD 06/21/14 [phone]

## 2014-06-21 NOTE — ED Provider Notes (Signed)
On reexamination, the patient had more lower to mid pelvic type pain on exam.  Patient did have some right adnexal tenderness and will treat for PID based on this and [facility]  Patient also has a nabothian cyst noted on ultrasound and CT scan.  Patient has normal Doppler flow to both ovaries, the patient is advised of the plan and all questions were answered  Carlyle DollyChristopher W Isla Sabree, PA-C 06/21/14 1016  Suzi RootsKevin E Steinl, MD 06/21/14 [phone]

## 2014-06-21 NOTE — ED Notes (Signed)
Pt drove, and trying to call someone to come get her and her car.

## 2014-06-21 NOTE — ED Notes (Signed)
Shari, PA, at the bedside.  

## 2014-06-21 NOTE — ED Notes (Signed)
Redraw of rainbow tubes sent to main lab, verified with Robin, EMT in minilab.

## 2014-06-21 NOTE — ED Notes (Signed)
Spoke with Patsy in main lab, she verified that redraw for cbc with diff was received and 3 extra tubes held if needed.

## 2014-06-21 NOTE — Discharge Instructions (Signed)
Follow up with clinic provided. Return here as needed. Increase your fluid intake.

## 2014-06-21 NOTE — ED Notes (Signed)
Patient reports she has been having lower right quadrant abdominal pain since 7pm yesterday evening, and became unbearable this morning around 4.  Vomited once, pain 10/10.

## 2014-06-22 LAB — GC/CHLAMYDIA PROBE AMP
CT Probe RNA: NEGATIVE
GC Probe RNA: NEGATIVE

## 2014-07-28 ENCOUNTER — Encounter (HOSPITAL_COMMUNITY): Payer: Self-pay | Admitting: Emergency Medicine

## 2014-07-28 ENCOUNTER — Emergency Department (HOSPITAL_COMMUNITY)
Admission: EM | Admit: 2014-07-28 | Discharge: 2014-07-28 | Disposition: A | Discharge status: Home / Self Care | Payer: Medicaid Other | Source: Home / Self Care | Attending: Family Medicine | Admitting: Family Medicine

## 2014-07-28 DIAGNOSIS — A084 Viral intestinal infection, unspecified: Secondary | ICD-10-CM

## 2014-07-28 MED ORDER — ONDANSETRON HCL 4 MG PO TABS: 4.0000 mg | ORAL_TABLET | Freq: Four times a day (QID) | ORAL | Status: DC

## 2014-07-28 MED ORDER — ONDANSETRON 4 MG PO TBDP
4.0000 mg | ORAL_TABLET | Freq: Once | ORAL | Status: AC
  Administered 2014-07-28: 4 mg via ORAL

## 2014-07-28 MED ORDER — ONDANSETRON 4 MG PO TBDP
ORAL_TABLET | ORAL | Status: AC
  Filled 2014-07-28: qty 1

## 2014-07-28 NOTE — ED Notes (Signed)
C/o  Sore throat.  Diarrhea.  Denies fever and vomiting.  X 3 days.   No otc meds tried for symptoms.

## 2014-07-28 NOTE — ED Provider Notes (Signed)
CSN: [id number]     Arrival date & time 07/28/14  0908 History   First MD Initiated Contact with Patient 07/28/14 [phone]     Chief Complaint  Patient presents with  . URI   (Consider location/radiation/quality/duration/timing/severity/associated sxs/prior Treatment) HPI Comments: 34 year old female complaining of a sore throat for 3 days associated with diarrhea for 3 days. She has had a little by mouth intake but is able to drink small amounts of water to time without vomiting. She feels fainty when she is sitting up or standing. She denies earache, runny nose or fever. She occasionally has sharp pains across the upper abdomen. Denies shortness of breath or vomiting but does have nausea.   Past Medical History  Diagnosis Date  . Asthma   . Abnormal Pap smear   . Other acne     ectopic pregnancy.    Past Surgical History  Procedure Laterality Date  . Finger surgery    . Rotator cuff repair    . Cesarean section  2005  . Tubal ligation  2009  . Colposcopy  2013  . Leep  2013   Family History  Problem Relation Age of Onset  . Diabetes Mother   . Hypertension Sister    History  Substance Use Topics  . Smoking status: Current Every Day Smoker -- 0.50 packs/day for 12 years    Types: Cigarettes  . Smokeless tobacco: Never Used     Comment: 1-800- ouit # given  . Alcohol Use: 0.0 oz/week     Comment: socially   OB History    Gravida Para Term Preterm AB TAB SAB Ectopic Multiple Living   4 1 1  3 2 1   1      Review of Systems  Constitutional: Positive for activity change and appetite change. Negative for fever.  HENT: Positive for rhinorrhea and sore throat. Negative for congestion and sneezing.   Respiratory: Negative.   Gastrointestinal: Positive for nausea, abdominal pain and diarrhea. Negative for vomiting.  Genitourinary: Negative.   Skin: Negative.   Neurological: Positive for light-headedness.    Allergies  Darvon; Percocet; and Sulfa drugs cross reactors  Home  Medications   Prior to Admission medications   Medication Sig Start Date End Date Taking? Authorizing Provider  ferrous sulfate 325 (65 FE) MG tablet Take 325 mg by mouth 3 (three) times daily as needed. Pt only takes when her cycle is going on   Yes Historical Provider, MD  cephALEXin (KEFLEX) 500 MG capsule Take 1 capsule (500 mg total) by mouth 4 (four) times daily. Take all of medicine and drink lots of fluids Patient not taking: Reported on 06/21/2014 04/25/14   Linna HoffJames D Kindl, MD  doxycycline (VIBRAMYCIN) 100 MG capsule Take 1 capsule (100 mg total) by mouth 2 (two) times daily. 06/21/14   Carlyle Dollyhristopher W Lawyer, PA-C  HYDROcodone-acetaminophen (NORCO/VICODIN) 5-325 MG per tablet Take 1 tablet by mouth every 6 (six) hours as needed for moderate pain. 06/21/14   Jamesetta Orleanshristopher W Lawyer, PA-C  ibuprofen (ADVIL,MOTRIN) 800 MG tablet Take 1 tablet (800 mg total) by mouth 3 (three) times daily. Patient taking differently: Take 800 mg by mouth every 8 (eight) hours as needed for moderate pain.  02/10/14   Delbert PhenixLinda M Barefoot, NP  ondansetron (ZOFRAN) 4 MG tablet Take 1 tablet (4 mg total) by mouth every 6 (six) hours. 07/28/14   Hayden Rasmussenavid Hakan Nudelman, NP  traMADol (ULTRAM) 50 MG tablet Take 1 tablet (50 mg total) by mouth every 6 (six) hours  as needed. Patient taking differently: Take 50 mg by mouth every 6 (six) hours as needed for moderate pain.  02/10/14   Delbert Phenix, NP   BP 109/74 mmHg  Pulse 80  Temp(Src) 98.4 F (36.9 C) (Oral)  Resp 16  SpO2 100%  LMP 07/04/2014 Physical Exam  Constitutional: She is oriented to person, place, and time. She appears well-developed. No distress.  HENT:  Bilateral TMs are normal Oropharynx with scant clear PND, no erythema or exudates.  Eyes: Conjunctivae and EOM are normal.  Neck: Normal range of motion. Neck supple.  Cardiovascular: Normal rate, regular rhythm and normal heart sounds.   Pulmonary/Chest: Effort normal and breath sounds normal. No respiratory  distress. She has no wheezes. She has no rales.  Abdominal: Soft. She exhibits no distension. There is no tenderness. There is no rebound and no guarding.  Lymphadenopathy:    She has no cervical adenopathy.  Neurological: She is alert and oriented to person, place, and time.  Skin: Skin is warm and dry.  Psychiatric: She has a normal mood and affect.  Nursing note and vitals reviewed.   ED Course  Procedures (including critical care time) Labs Review Labs Reviewed - No data to display  Imaging Review No results found.   MDM   1. Viral gastroenteritis     Clear liquids Zofran Rest tylenol as needed      Hayden Rasmussen, NP 07/28/14 1027

## 2014-07-28 NOTE — Discharge Instructions (Signed)
Gastritis, Adult Gastritis is soreness and puffiness (inflammation) of the lining of the stomach. If you do not get help, gastritis can cause bleeding and sores (ulcers) in the stomach. HOME CARE   Only take medicine as told by your doctor.  If you were given antibiotic medicines, take them as told. Finish the medicines even if you start to feel better.  Drink enough fluids to keep your pee (urine) clear or pale yellow.  Avoid foods and drinks that make your problems worse. Foods you may want to avoid include:  Caffeine or alcohol.  Chocolate.  Mint.  Garlic and onions.  Spicy foods.  Citrus fruits, including oranges, lemons, or limes.  Food containing tomatoes, including sauce, chili, salsa, and pizza.  Fried and fatty foods.  Eat small meals throughout the day instead of large meals. GET HELP RIGHT AWAY IF:   You have black or dark red poop (stools).  You throw up (vomit) blood. It may look like coffee grounds.  You cannot keep fluids down.  Your belly (abdominal) pain gets worse.  You have a fever.  You do not feel better after 1 week.  You have any other questions or concerns. MAKE SURE YOU:   Understand these instructions.  Will watch your condition.  Will get help right away if you are not doing well or get worse. Document Released: 12/26/2007 Document Revised: 10/01/2011 Document Reviewed: 08/22/2011 [facility] Patient Information 2015 [place], [place]. This information is not intended to replace advice given to you by your health care provider. Make sure you discuss any questions you have with your health care provider.  Viral Gastroenteritis Clear liquids Zofran Rest tylenol as needed Viral gastroenteritis is also called stomach flu. This illness is caused by a certain type of germ (virus). It can cause sudden watery poop (diarrhea) and throwing up (vomiting). This can cause you to lose body fluids (dehydration). This illness usually lasts for 3 to 8  days. It usually goes away on its own. HOME CARE   Drink enough fluids to keep your pee (urine) clear or pale yellow. Drink small amounts of fluids often.  Ask your doctor how to replace body fluid losses (rehydration).  Avoid:  Foods high in sugar.  Alcohol.  Bubbly (carbonated) drinks.  Tobacco.  Juice.  Caffeine drinks.  Very hot or cold fluids.  Fatty, greasy foods.  Eating too much at one time.  Dairy products until 24 to 48 hours after your watery poop stops.  You may eat foods with active cultures (probiotics). They can be found in some yogurts and supplements.  Wash your hands well to avoid spreading the illness.  Only take medicines as told by your doctor. Do not give aspirin to children. Do not take medicines for watery poop (antidiarrheals).  Ask your doctor if you should keep taking your regular medicines.  Keep all doctor visits as told. GET HELP RIGHT AWAY IF:   You cannot keep fluids down.  You do not pee at least once every 6 to 8 hours.  You are short of breath.  You see blood in your poop or throw up. This may look like coffee grounds.  You have belly (abdominal) pain that gets worse or is just in one small spot (localized).  You keep throwing up or having watery poop.  You have a fever.  The patient is a child younger than 3 months, and he or she has a fever.  The patient is a child older than 3 months, and  he or she has a fever and problems that do not go away.  The patient is a child older than 3 months, and he or she has a fever and problems that suddenly get worse.  The patient is a baby, and he or she has no tears when crying. MAKE SURE YOU:   Understand these instructions.  Will watch your condition.  Will get help right away if you are not doing well or get worse. Document Released: 12/26/2007 Document Revised: 10/01/2011 Document Reviewed: 04/25/2011 [facility] Patient Information 2015 [place], [place]. This information is  not intended to replace advice given to you by your health care provider. Make sure you discuss any questions you have with your health care provider.

## 2014-08-01 ENCOUNTER — Emergency Department (HOSPITAL_COMMUNITY)
Admission: EM | Admit: 2014-08-01 | Discharge: 2014-08-01 | Disposition: A | Discharge status: Home / Self Care | Payer: Medicaid Other | Source: Home / Self Care | Attending: Emergency Medicine | Admitting: Emergency Medicine

## 2014-08-01 ENCOUNTER — Encounter (HOSPITAL_COMMUNITY): Payer: Self-pay

## 2014-08-01 DIAGNOSIS — Z3201 Encounter for pregnancy test, result positive: Secondary | ICD-10-CM | POA: Diagnosis not present

## 2014-08-01 DIAGNOSIS — J40 Bronchitis, not specified as acute or chronic: Secondary | ICD-10-CM

## 2014-08-01 DIAGNOSIS — Z331 Pregnant state, incidental: Secondary | ICD-10-CM

## 2014-08-01 LAB — POCT PREGNANCY, URINE: PREG TEST UR: POSITIVE — AB

## 2014-08-01 MED ORDER — AZITHROMYCIN 250 MG PO TABS: ORAL_TABLET | ORAL | Status: DC

## 2014-08-01 MED ORDER — PREDNISONE 20 MG PO TABS: 40.0000 mg | ORAL_TABLET | Freq: Every day | ORAL | Status: DC

## 2014-08-01 MED ORDER — BENZONATATE 100 MG PO CAPS: 100.0000 mg | ORAL_CAPSULE | Freq: Two times a day (BID) | ORAL | Status: DC | PRN

## 2014-08-01 NOTE — ED Notes (Signed)
Patient complains of SOB, sneezing cough and congestion and chills That started approx. Three days ago Has been using thera flu but not getting any better Patient states she did not receive the flu vaccine this year

## 2014-08-01 NOTE — ED Provider Notes (Signed)
CSN: [id number]     Arrival date & time 08/01/14  1716 History   First MD Initiated Contact with Patient 08/01/14 1721     Chief Complaint  Patient presents with  . Influenza   (Consider location/radiation/quality/duration/timing/severity/associated sxs/prior Treatment) HPI She is a 34 year old woman here for evaluation of cough. She is seen here on the sixth for a gastroenteritis. After that, she developed nasal congestion, cough, chest congestion, chills. Her mom states she had a subjective fever as well. Her nausea, vomiting, diarrhea have resolved. She does report some fatigue. No body aches. She does report some shortness of breath when moving around.  She also requests a pregnancy test as her period is 3 days late.  Past Medical History  Diagnosis Date  . Asthma   . Abnormal Pap smear   . Other acne     ectopic pregnancy.    Past Surgical History  Procedure Laterality Date  . Finger surgery    . Rotator cuff repair    . Cesarean section  2005  . Tubal ligation  2009  . Colposcopy  2013  . Leep  2013   Family History  Problem Relation Age of Onset  . Diabetes Mother   . Hypertension Sister    History  Substance Use Topics  . Smoking status: Current Every Day Smoker -- 0.50 packs/day for 12 years    Types: Cigarettes  . Smokeless tobacco: Never Used     Comment: 1-800- ouit # given  . Alcohol Use: 0.0 oz/week     Comment: socially   OB History    Gravida Para Term Preterm AB TAB SAB Ectopic Multiple Living   Review of Systems  Constitutional: Positive for fever, chills and fatigue.  HENT: Positive for congestion. Negative for sore throat.   Respiratory: Positive for cough and shortness of breath.   Cardiovascular: Negative for chest pain.  Gastrointestinal: Negative for nausea, vomiting, abdominal pain and diarrhea.  Musculoskeletal: Negative for myalgias.    Allergies  Darvon; Percocet; and Sulfa drugs cross reactors  Home  Medications   Prior to Admission medications   Medication Sig Start Date End Date Taking? Authorizing Provider  azithromycin (ZITHROMAX Z-PAK) 250 MG tablet Take 2 pills today, then 1 pill daily until gone 08/01/14   Charm Rings, MD  benzonatate (TESSALON) 100 MG capsule Take 1 capsule (100 mg total) by mouth 2 (two) times daily as needed for cough. 08/01/14   Charm Rings, MD  cephALEXin (KEFLEX) 500 MG capsule Take 1 capsule (500 mg total) by mouth 4 (four) times daily. Take all of medicine and drink lots of fluids Patient not taking: Reported on 06/21/2014 04/25/14   Linna Hoff, MD  doxycycline (VIBRAMYCIN) 100 MG capsule Take 1 capsule (100 mg total) by mouth 2 (two) times daily. 06/21/14   Carlyle Dolly, PA-C  ferrous sulfate 325 (65 FE) MG tablet Take 325 mg by mouth 3 (three) times daily as needed. Pt only takes when her cycle is going on    Historical Provider, MD  HYDROcodone-acetaminophen (NORCO/VICODIN) 5-325 MG per tablet Take 1 tablet by mouth every 6 (six) hours as needed for moderate pain. 06/21/14   Jamesetta Orleans Lawyer, PA-C  ibuprofen (ADVIL,MOTRIN) 800 MG tablet Take 1 tablet (800 mg total) by mouth 3 (three) times daily. Patient taking differently: Take 800 mg by mouth every 8 (eight) hours as needed for moderate pain.  02/10/14   Delbert PhenixLinda M Barefoot, NP  ondansetron (ZOFRAN) 4 MG tablet Take 1 tablet (4 mg total) by mouth every 6 (six) hours. 07/28/14   Hayden Rasmussenavid Mabe, NP  predniSONE (DELTASONE) 20 MG tablet Take 2 tablets (40 mg total) by mouth daily. 08/01/14   Charm RingsErin J Sydni Elizarraraz, MD  traMADol (ULTRAM) 50 MG tablet Take 1 tablet (50 mg total) by mouth every 6 (six) hours as needed. Patient taking differently: Take 50 mg by mouth every 6 (six) hours as needed for moderate pain.  02/10/14   Delbert PhenixLinda M Barefoot, NP   BP 122/84 mmHg  Pulse 102  Temp(Src) 98.2 F (36.8 C) (Oral)  Resp 22  SpO2 100%  LMP 07/04/2014 Physical Exam  Constitutional: She is oriented to person, place, and  time. She appears well-developed and well-nourished. She appears distressed (looks sick).  HENT:  Right Ear: External ear normal.  Left Ear: External ear normal.  Nose: Nose normal.  Mouth/Throat: Oropharynx is clear and moist. No oropharyngeal exudate.  Neck: Neck supple.  Cardiovascular: Normal rate, regular rhythm and normal heart sounds.   No murmur heard. Pulmonary/Chest: Effort normal and breath sounds normal. No respiratory distress. She has no wheezes. She has no rales.  Lymphadenopathy:    She has no cervical adenopathy.  Neurological: She is alert and oriented to person, place, and time.    ED Course  Procedures (including critical care time) Labs Review Labs Reviewed  POCT PREGNANCY, URINE - Abnormal; Notable for the following:    Preg Test, Ur POSITIVE (*)    All other components within normal limits    Imaging Review No results found.   MDM   1. Bronchitis   2. Pregnancy as incidental finding    We'll treat bronchitis with azithromycin. Tessalon as needed for cough. We'll not do prednisone as she is pregnant. Recommended that she start a prenatal vitamin and establish OB care.  Follow-up as needed.    Charm RingsErin J Yunique Dearcos, MD 08/01/14 [id number]

## 2014-08-01 NOTE — Discharge Instructions (Signed)
You have bronchitis. Take prednisone 2 tablets daily for 5 days. Take the z-pac as prescribed. Use tessalon twice a day as needed for cough. Make sure you are drinking plenty of fluids.  Follow up as needed.

## 2014-08-02 ENCOUNTER — Inpatient Hospital Stay (HOSPITAL_COMMUNITY): Payer: Medicaid Other

## 2014-08-02 ENCOUNTER — Inpatient Hospital Stay (HOSPITAL_COMMUNITY)
Admission: AD | Admit: 2014-08-02 | Discharge: 2014-08-02 | Disposition: A | Discharge status: Home / Self Care | Payer: Medicaid Other | Source: Ambulatory Visit | Attending: Family Medicine | Admitting: Family Medicine

## 2014-08-02 ENCOUNTER — Encounter (HOSPITAL_COMMUNITY): Payer: Self-pay | Admitting: *Deleted

## 2014-08-02 DIAGNOSIS — O9989 Other specified diseases and conditions complicating pregnancy, childbirth and the puerperium: Secondary | ICD-10-CM | POA: Diagnosis not present

## 2014-08-02 DIAGNOSIS — O99330 Smoking (tobacco) complicating pregnancy, unspecified trimester: Secondary | ICD-10-CM | POA: Insufficient documentation

## 2014-08-02 DIAGNOSIS — O3680X0 Pregnancy with inconclusive fetal viability, not applicable or unspecified: Secondary | ICD-10-CM

## 2014-08-02 DIAGNOSIS — Z3A00 Weeks of gestation of pregnancy not specified: Secondary | ICD-10-CM | POA: Diagnosis not present

## 2014-08-02 DIAGNOSIS — F17290 Nicotine dependence, other tobacco product, uncomplicated: Secondary | ICD-10-CM | POA: Insufficient documentation

## 2014-08-02 DIAGNOSIS — O26899 Other specified pregnancy related conditions, unspecified trimester: Secondary | ICD-10-CM

## 2014-08-02 DIAGNOSIS — R109 Unspecified abdominal pain: Secondary | ICD-10-CM

## 2014-08-02 LAB — CBC
HCT: 33.9 % — ABNORMAL LOW (ref 36.0–46.0)
Hemoglobin: 11.2 g/dL — ABNORMAL LOW (ref 12.0–15.0)
MCH: 26.1 pg (ref 26.0–34.0)
MCHC: 33 g/dL (ref 30.0–36.0)
MCV: 79 fL (ref 78.0–100.0)
Platelets: 208 10*3/uL (ref 150–400)
RBC: 4.29 MIL/uL (ref 3.87–5.11)
RDW: 12.7 % (ref 11.5–15.5)
WBC: 2.6 10*3/uL — ABNORMAL LOW (ref 4.0–10.5)

## 2014-08-02 LAB — HCG, QUANTITATIVE, PREGNANCY: HCG, BETA CHAIN, QUANT, S: 529 m[IU]/mL — AB (ref <5)

## 2014-08-02 LAB — URINALYSIS, ROUTINE W REFLEX MICROSCOPIC
BILIRUBIN URINE: NEGATIVE
Glucose, UA: NEGATIVE mg/dL
HGB URINE DIPSTICK: NEGATIVE
Ketones, ur: NEGATIVE mg/dL
NITRITE: NEGATIVE
Protein, ur: NEGATIVE mg/dL
Specific Gravity, Urine: 1.015 (ref 1.005–1.030)
UROBILINOGEN UA: 0.2 mg/dL (ref 0.0–1.0)
pH: 6.5 (ref 5.0–8.0)

## 2014-08-02 LAB — WET PREP, GENITAL
TRICH WET PREP: NONE SEEN
Yeast Wet Prep HPF POC: NONE SEEN

## 2014-08-02 LAB — URINE MICROSCOPIC-ADD ON

## 2014-08-02 LAB — ABO/RH: ABO/RH(D): AB POS

## 2014-08-02 LAB — POCT PREGNANCY, URINE: Preg Test, Ur: POSITIVE — AB

## 2014-08-02 NOTE — MAU Provider Note (Signed)
History   CSN: [id number]  Arrival date and time: 08/02/14 [id number]   First Provider Initiated Contact with Patient 08/02/14 0945       Chief Complaint  Patient presents with  . Abdominal Pain   HPI [name] is a 34 y.o. [phone] with a history of ectopic pregnancy 7 years ago, who presents to MAU with complaints of abdominal pain x1 day. States the pain is cramping-like, 8/10 at max intensity, and localized to lower abdomen. Also endorses white, curd-like vaginal discharge. Denies any fevers, nausea, vomiting, constipation, urinary frequency or urgency, dysuria, vaginal bleeding, or chest pain. Was seen yesterday at [facility] ED for bronchitis and was prescribed azithromycin at that time, has not started it yet. Pregnancy was diagnosed at that time as an incidental finding. Pt. unsure of LMP, estimates at 07/04/2014.  Patient admits to history of frequent yeast infections and BV, with last episode 3-6 months ago. Last antibiotic use was in November, doxycycline for UTI per patient.   OB History    Gravida Para Term Preterm AB TAB SAB Ectopic Multiple Living   5 1 1  3 2  0 1  1      Past Medical History  Diagnosis Date  . Asthma   . Abnormal Pap smear   . Other acne     ectopic pregnancy.     Past Surgical History  Procedure Laterality Date  . Finger surgery    . Rotator cuff repair    . Cesarean section  2005  . Colposcopy  2013  . Leep  2013  . Ectopic pregnancy surgery      Family History  Problem Relation Age of Onset  . Diabetes Mother   . Hypertension Sister     History  Substance Use Topics  . Smoking status: Current Every Day Smoker -- 0.50 packs/day for 12 years    Types: E-cigarettes  . Smokeless tobacco: Never Used     Comment: 1-800- ouit # given  . Alcohol Use: 0.0 oz/week     Comment: socially    Allergies:  Allergies  Allergen Reactions  . Darvon [Propoxyphene] Itching  . Percocet [Oxycodone-Acetaminophen] Hives  . Sulfa Drugs Cross Reactors  Hives    Prescriptions prior to admission  Medication Sig Dispense Refill Last Dose  . ferrous sulfate 325 (65 FE) MG tablet Take 325 mg by mouth 3 (three) times daily as needed. Pt only takes when her cycle is going on   Past Week at Unknown time  . azithromycin (ZITHROMAX Z-PAK) 250 MG tablet Take 2 pills today, then 1 pill daily until gone (Patient not taking: Reported on 08/02/2014) 6 tablet 0   . benzonatate (TESSALON) 100 MG capsule Take 1 capsule (100 mg total) by mouth 2 (two) times daily as needed for cough. (Patient not taking: Reported on 08/02/2014) 30 capsule 0   . cephALEXin (KEFLEX) 500 MG capsule Take 1 capsule (500 mg total) by mouth 4 (four) times daily. Take all of medicine and drink lots of fluids (Patient not taking: Reported on 06/21/2014) 20 capsule 0 Unknown at Unknown time  . doxycycline (VIBRAMYCIN) 100 MG capsule Take 1 capsule (100 mg total) by mouth 2 (two) times daily. (Patient not taking: Reported on 08/02/2014) 28 capsule 0 Completed Course at Unknown time  . HYDROcodone-acetaminophen (NORCO/VICODIN) 5-325 MG per tablet Take 1 tablet by mouth every 6 (six) hours as needed for moderate pain. (Patient not taking: Reported on 08/02/2014) 15 tablet 0 Unknown at Unknown time  .  ibuprofen (ADVIL,MOTRIN) 800 MG tablet Take 1 tablet (800 mg total) by mouth 3 (three) times daily. (Patient taking differently: Take 800 mg by mouth every 8 (eight) hours as needed for moderate pain. ) 60 tablet 1 Unknown at Unknown time  . ondansetron (ZOFRAN) 4 MG tablet Take 1 tablet (4 mg total) by mouth every 6 (six) hours. (Patient not taking: Reported on 08/02/2014) 12 tablet 0   . traMADol (ULTRAM) 50 MG tablet Take 1 tablet (50 mg total) by mouth every 6 (six) hours as needed. (Patient not taking: Reported on 08/02/2014) 10 tablet 0 Unknown at Unknown time    Review of Systems  Constitutional: Negative for fever and chills.  Eyes: Negative for blurred vision.  Cardiovascular: Negative for  chest pain.  Gastrointestinal: Positive for abdominal pain (RLQ and suprapibic) and diarrhea (episode yesterday). Negative for nausea, vomiting, constipation and blood in stool.  Genitourinary: Negative for dysuria, urgency, frequency, hematuria and flank pain.  Neurological: Positive for headaches (sinus pressure).     Physical Exam   Blood pressure 114/80, pulse 75, temperature 98.2 F (36.8 C), temperature source Oral, resp. rate 18, height  (1.575 m), weight 151 lb (68.493 kg), last menstrual period 07/04/2014.  Physical Exam  Vitals reviewed. Constitutional: She is oriented to person, place, and time. She appears well-developed and well-nourished. No distress.  HENT:  Head: Normocephalic and atraumatic.  Neck: Normal range of motion.  Cardiovascular: Normal rate.   Respiratory: Effort normal.  GI: Soft. Bowel sounds are normal. She exhibits no distension and no mass. There is tenderness (bilateral LQ, R>L). There is no rebound and no guarding.  Genitourinary: There is no rash, tenderness, lesion or injury on the right labia. There is no rash, tenderness, lesion or injury on the left labia. Cervix exhibits motion tenderness (mild ) and friability. Cervix exhibits no discharge. Right adnexum displays mass (right ovary palpated enlarged) and tenderness (marked during palpation). Right adnexum displays no fullness. Left adnexum displays tenderness (mild). Left adnexum displays no mass and no fullness. No erythema, tenderness or bleeding in the vagina. No foreign body around the vagina. No signs of injury around the vagina. Vaginal discharge (thick white discharge present) found.  Neurological: She is alert and oriented to person, place, and time.  Skin: Skin is warm and dry.    MAU Course  Procedures None  MDM UA: trace leukocytes, nitrates negative CBC: unremarkable [place]: no IUP visualized, no adnexal masses.  Quant bHCG: 529,  Consistent with early pregnancy, but cannot rule out  ectopic  Results for orders placed or performed during the hospital encounter of 08/02/14 (from the past 24 hour(s))  Urinalysis, Routine w reflex microscopic     Status: Abnormal   Collection Time: 08/02/14  9:08 AM  Result Value Ref Range   Color, Urine YELLOW YELLOW   APPearance CLEAR CLEAR   Specific Gravity, Urine 1.015 1.005 - 1.030   pH 6.5 5.0 - 8.0   Glucose, UA NEGATIVE NEGATIVE mg/dL   Hgb urine dipstick NEGATIVE NEGATIVE   Bilirubin Urine NEGATIVE NEGATIVE   Ketones, ur NEGATIVE NEGATIVE mg/dL   Protein, ur NEGATIVE NEGATIVE mg/dL   Urobilinogen, UA 0.2 0.0 - 1.0 mg/dL   Nitrite NEGATIVE NEGATIVE   Leukocytes, UA TRACE (A) NEGATIVE  Urine microscopic-add on     Status: Abnormal   Collection Time: 08/02/14  9:08 AM  Result Value Ref Range   Squamous Epithelial / LPF FEW (A) RARE   WBC, UA 0-2 <3 WBC/hpf  Bacteria, UA RARE RARE  Pregnancy, urine POC     Status: Abnormal   Collection Time: 08/02/14  9:09 AM  Result Value Ref Range   Preg Test, Ur POSITIVE (A) NEGATIVE  Wet prep, genital     Status: Abnormal   Collection Time: 08/02/14 10:22 AM  Result Value Ref Range   Yeast Wet Prep HPF POC NONE SEEN NONE SEEN   Trich, Wet Prep NONE SEEN NONE SEEN   Clue Cells Wet Prep HPF POC FEW (A) NONE SEEN   WBC, Wet Prep HPF POC FEW (A) NONE SEEN  CBC     Status: Abnormal   Collection Time: 08/02/14 10:25 AM  Result Value Ref Range   WBC 2.6 (L) 4.0 - 10.5 K/uL   RBC 4.29 3.87 - 5.11 MIL/uL   Hemoglobin 11.2 (L) 12.0 - 15.0 g/dL   HCT 11.9 (L) 14.7 - 82.9 %   MCV 79.0 78.0 - 100.0 fL   MCH 26.1 26.0 - 34.0 pg   MCHC 33.0 30.0 - 36.0 g/dL   RDW 56.2 13.0 - 86.5 %   Platelets 208 150 - 400 K/uL  ABO/Rh     Status: None   Collection Time: 08/02/14 10:25 AM  Result Value Ref Range   ABO/RH(D) AB POS   hCG, quantitative, pregnancy     Status: Abnormal   Collection Time: 08/02/14 10:25 AM  Result Value Ref Range   hCG, Beta Chain, Quant, S 529 (H) <5 mIU/mL   US Ob  Comp Less 14 Wks  08/02/2014   CLINICAL DATA:  Pregnant, right lower quadrant pain, prior ectopic, beta HCG pending  EXAM: OBSTETRIC <14 WK [place] AND TRANSVAGINAL OB US  TECHNIQUE: Both transabdominal and transvaginal ultrasound examinations were performed for complete evaluation of the gestation as well as the maternal uterus, adnexal regions, and pelvic cul-de-sac. Transvaginal technique was performed to assess early pregnancy.  COMPARISON:  None.  FINDINGS: Intrauterine gestational sac: Not visualized  Maternal uterus/adnexae: Endometrial complex measures 7 mm.  Irregular/complex fluid in the endometrium.  Right ovary is within normal limits, measuring 2.2 x 1.6 x 1.4 cm.  Left ovary is within normal limits, measuring 3.1 x 2.3 x 2.2 cm.  No free fluid.  IMPRESSION: No IUP is visualized. By definition, this reflects a pregnancy of unknown location.  Differential considerations include early normal IUP, abnormal IUP/missed abortion, or nonvisualized ectopic pregnancy.  Correlate with beta HCG. Serial beta HCG is suggested, supplemented by repeat pelvic sonography in 14 days.   Electronically Signed   By: Charline Bills M.D.   On: 08/02/2014 11:23   US Ob Transvaginal  08/02/2014   CLINICAL DATA:  Pregnant, right lower quadrant pain, prior ectopic, beta HCG pending  EXAM: OBSTETRIC <14 WK [place] AND TRANSVAGINAL OB US  TECHNIQUE: Both transabdominal and transvaginal ultrasound examinations were performed for complete evaluation of the gestation as well as the maternal uterus, adnexal regions, and pelvic cul-de-sac. Transvaginal technique was performed to assess early pregnancy.  COMPARISON:  None.  FINDINGS: Intrauterine gestational sac: Not visualized  Maternal uterus/adnexae: Endometrial complex measures 7 mm.  Irregular/complex fluid in the endometrium.  Right ovary is within normal limits, measuring 2.2 x 1.6 x 1.4 cm.  Left ovary is within normal limits, measuring 3.1 x 2.3 x 2.2 cm.  No free fluid.   IMPRESSION: No IUP is visualized. By definition, this reflects a pregnancy of unknown location.  Differential considerations include early normal IUP, abnormal IUP/missed abortion, or nonvisualized ectopic pregnancy.  Correlate with beta HCG. Serial beta HCG is suggested, supplemented by repeat pelvic sonography in 14 days.   Electronically Signed   By: Charline Bills M.D.   On: 08/02/2014 11:23    Assessment and Plan  1. Pregnancy of unknown location, cannot rule out ectopic.  2. AB positive blood type  Patient will come back in 48hrs to have quant BhCG rechecked at the [facility]. Will seek immediate care in case of worsening symptoms, vaginal bleeding, or severe abdominal pain. Counseled on abstaining from intercourse and heavy lifting for now. Work note given until 08/04/2014.  Misior, Marlana Salvage 08/02/2014, 11:43 AM   Evaluation and management procedures were performed by the PA student under my supervision and collaboration. I have reviewed the note and chart, and I agree with the management and plan.  Iona Hansen Danell Verno, NP 08/02/2014 12:33 PM

## 2014-08-02 NOTE — MAU Note (Signed)
Abdominal pain since last night, constant, more on RLQ. No vaginal bleeding, but has thick "cottage cheese" like D/C. States was seen in Urgent care yesterday for upper resp sxs. Was told she has bronchitis.

## 2014-08-02 NOTE — Discharge Instructions (Signed)

## 2014-08-03 LAB — GC/CHLAMYDIA PROBE AMP
CT Probe RNA: NEGATIVE
GC Probe RNA: NEGATIVE

## 2014-08-03 LAB — HIV ANTIBODY (ROUTINE TESTING W REFLEX): HIV 1/HIV 2 AB: NONREACTIVE

## 2014-08-04 ENCOUNTER — Telehealth: Payer: Self-pay | Admitting: *Deleted

## 2014-08-04 ENCOUNTER — Other Ambulatory Visit: Payer: Medicaid Other

## 2014-08-04 DIAGNOSIS — Z3490 Encounter for supervision of normal pregnancy, unspecified, unspecified trimester: Secondary | ICD-10-CM

## 2014-08-04 DIAGNOSIS — O039 Complete or unspecified spontaneous abortion without complication: Secondary | ICD-10-CM

## 2014-08-04 LAB — HCG, QUANTITATIVE, PREGNANCY: hCG, Beta Chain, Quant, S: 1244 m[IU]/mL

## 2014-08-04 NOTE — Progress Notes (Signed)
Pt reports having some pain about the same as before. No bleeding. Reviewed this information with Dorathy KinsmanVirginia Smith, who stated that for now just to monitor her symptoms and return to MAU if her pain worsens or bleeding starts. Patient is agreeable.

## 2014-08-04 NOTE — Telephone Encounter (Signed)
Contacted patient and informed of results/ultrasound appointment. Reinforced sign/symptoms of concerns to go to MAU.  Pt verbalizes understanding and has no further questions.

## 2014-08-13 ENCOUNTER — Ambulatory Visit (HOSPITAL_COMMUNITY): Admission: RE | Admit: 2014-08-13 | Payer: Medicaid Other | Source: Ambulatory Visit

## 2014-08-17 ENCOUNTER — Ambulatory Visit (HOSPITAL_COMMUNITY)
Admission: RE | Admit: 2014-08-17 | Discharge: 2014-08-17 | Disposition: A | Discharge status: Home / Self Care | Payer: Medicaid Other | Source: Ambulatory Visit | Attending: Advanced Practice Midwife | Admitting: Advanced Practice Midwife

## 2014-08-17 ENCOUNTER — Other Ambulatory Visit: Payer: Self-pay | Admitting: Advanced Practice Midwife

## 2014-08-17 DIAGNOSIS — N949 Unspecified condition associated with female genital organs and menstrual cycle: Secondary | ICD-10-CM | POA: Insufficient documentation

## 2014-08-17 DIAGNOSIS — O208 Other hemorrhage in early pregnancy: Secondary | ICD-10-CM | POA: Insufficient documentation

## 2014-08-17 DIAGNOSIS — O039 Complete or unspecified spontaneous abortion without complication: Secondary | ICD-10-CM

## 2014-08-17 DIAGNOSIS — O9989 Other specified diseases and conditions complicating pregnancy, childbirth and the puerperium: Secondary | ICD-10-CM | POA: Diagnosis present

## 2014-08-17 DIAGNOSIS — Z3A01 Less than 8 weeks gestation of pregnancy: Secondary | ICD-10-CM | POA: Insufficient documentation

## 2014-08-25 ENCOUNTER — Encounter (HOSPITAL_COMMUNITY): Payer: Self-pay | Admitting: *Deleted

## 2014-08-25 ENCOUNTER — Inpatient Hospital Stay (HOSPITAL_COMMUNITY)
Admission: AD | Admit: 2014-08-25 | Discharge: 2014-08-25 | Disposition: A | Discharge status: Home / Self Care | Payer: Medicaid Other | Source: Ambulatory Visit | Attending: Family Medicine | Admitting: Family Medicine

## 2014-08-25 ENCOUNTER — Inpatient Hospital Stay (HOSPITAL_COMMUNITY): Payer: Medicaid Other

## 2014-08-25 DIAGNOSIS — O26899 Other specified pregnancy related conditions, unspecified trimester: Secondary | ICD-10-CM

## 2014-08-25 DIAGNOSIS — F17290 Nicotine dependence, other tobacco product, uncomplicated: Secondary | ICD-10-CM | POA: Diagnosis not present

## 2014-08-25 DIAGNOSIS — O0389 Complete or unspecified spontaneous abortion with other complications: Secondary | ICD-10-CM

## 2014-08-25 DIAGNOSIS — R109 Unspecified abdominal pain: Secondary | ICD-10-CM | POA: Insufficient documentation

## 2014-08-25 DIAGNOSIS — N939 Abnormal uterine and vaginal bleeding, unspecified: Secondary | ICD-10-CM | POA: Diagnosis not present

## 2014-08-25 DIAGNOSIS — Z332 Encounter for elective termination of pregnancy: Secondary | ICD-10-CM

## 2014-08-25 LAB — URINALYSIS, ROUTINE W REFLEX MICROSCOPIC
BILIRUBIN URINE: NEGATIVE
GLUCOSE, UA: NEGATIVE mg/dL
Ketones, ur: NEGATIVE mg/dL
Leukocytes, UA: NEGATIVE
Nitrite: NEGATIVE
PH: 6 (ref 5.0–8.0)
Protein, ur: NEGATIVE mg/dL
Specific Gravity, Urine: 1.025 (ref 1.005–1.030)
Urobilinogen, UA: 0.2 mg/dL (ref 0.0–1.0)

## 2014-08-25 LAB — CBC WITH DIFFERENTIAL/PLATELET
BASOS ABS: 0 10*3/uL (ref 0.0–0.1)
BASOS PCT: 0 % (ref 0–1)
Eosinophils Absolute: 0.1 10*3/uL (ref 0.0–0.7)
Eosinophils Relative: 3 % (ref 0–5)
HCT: 28.4 % — ABNORMAL LOW (ref 36.0–46.0)
HEMOGLOBIN: 9.5 g/dL — AB (ref 12.0–15.0)
Lymphocytes Relative: 20 % (ref 12–46)
Lymphs Abs: 0.7 10*3/uL (ref 0.7–4.0)
MCH: 26.2 pg (ref 26.0–34.0)
MCHC: 33.5 g/dL (ref 30.0–36.0)
MCV: 78.2 fL (ref 78.0–100.0)
Monocytes Absolute: 0.2 10*3/uL (ref 0.1–1.0)
Monocytes Relative: 7 % (ref 3–12)
Neutro Abs: 2.2 10*3/uL (ref 1.7–7.7)
Neutrophils Relative %: 70 % (ref 43–77)
Platelets: 168 10*3/uL (ref 150–400)
RBC: 3.63 MIL/uL — ABNORMAL LOW (ref 3.87–5.11)
RDW: 12.5 % (ref 11.5–15.5)
WBC: 3.2 10*3/uL — ABNORMAL LOW (ref 4.0–10.5)

## 2014-08-25 LAB — URINE MICROSCOPIC-ADD ON

## 2014-08-25 LAB — HCG, QUANTITATIVE, PREGNANCY: hCG, Beta Chain, Quant, S: 1898 m[IU]/mL — ABNORMAL HIGH (ref <5)

## 2014-08-25 MED ORDER — HYDROMORPHONE HCL 1 MG/ML IJ SOLN
1.0000 mg | Freq: Once | INTRAMUSCULAR | Status: AC
  Administered 2014-08-25: 1 mg via INTRAMUSCULAR
  Filled 2014-08-25: qty 1

## 2014-08-25 NOTE — Progress Notes (Signed)
U/S dept and radiology contacted regarding delay in final U/S report.

## 2014-08-25 NOTE — MAU Note (Signed)
Pt states she had a TAB on 01/25 and began having a lot of pain 2 days ago. States she is having lower back pain and nausea. Has been taking ibuprofen 800 without relief.

## 2014-08-25 NOTE — MAU Provider Note (Signed)
History     CSN: [id number]  Arrival date and time: 08/25/14 0650   First Provider Initiated Contact with Patient 08/25/14 [phone]       Chief Complaint  Patient presents with  . Abdominal Pain   HPI    Julia Bautista is a 34 y.o. [id number] who presents with abdominal pain s/p TAB (6wk 1d) on 08/16/14.  Lower abdominal pain x 2 days. Comes & goes; feels like strong contractions. Rates as 9/10. Taking 800 mg of ibuprofen with no relief. Bright red spotting since procedure. Denies vaginal discharge or fever. Denies intercourse since procedure.   Patient has chronic anemia.   OB History    Gravida Para Term Preterm AB TAB SAB Ectopic Multiple Living   0 1  1      Past Medical History  Diagnosis Date  . Asthma   . Abnormal Pap smear   . Other acne     ectopic pregnancy.     Past Surgical History  Procedure Laterality Date  . Finger surgery    . Rotator cuff repair    . Cesarean section  2005  . Colposcopy  2013  . Leep  2013  . Ectopic pregnancy surgery      Family History  Problem Relation Age of Onset  . Diabetes Mother   . Hypertension Sister     History  Substance Use Topics  . Smoking status: Current Every Day Smoker -- 0.50 packs/day for 12 years    Types: E-cigarettes  . Smokeless tobacco: Never Used     Comment: 1-800- ouit # given  . Alcohol Use: 0.0 oz/week     Comment: socially    Allergies:  Allergies  Allergen Reactions  . Darvon [Propoxyphene] Itching  . Percocet [Oxycodone-Acetaminophen] Hives  . Sulfa Drugs Cross Reactors Hives    Prescriptions prior to admission  Medication Sig Dispense Refill Last Dose  . ciprofloxacin (CIPRO) 500 MG tablet Take 500 mg by mouth 2 (two) times daily.   08/24/2014 at Unknown time  . ferrous sulfate 325 (65 FE) MG tablet Take 325 mg by mouth 3 (three) times daily as needed. Pt only takes when her cycle is going on   08/24/2014 at Unknown time  . ibuprofen (ADVIL,MOTRIN) 800 MG tablet Take 800 mg by  mouth every 8 (eight) hours as needed.   08/25/2014 at 0600  . benzonatate (TESSALON) 100 MG capsule Take 1 capsule (100 mg total) by mouth 2 (two) times daily as needed for cough. (Patient not taking: Reported on 08/25/2014) 30 capsule 0    Results for orders placed or performed during the hospital encounter of 08/25/14 (from the past 48 hour(s))  Urinalysis, Routine w reflex microscopic     Status: Abnormal   Collection Time: 08/25/14  7:00 AM  Result Value Ref Range   Color, Urine YELLOW YELLOW   APPearance CLEAR CLEAR   Specific Gravity, Urine 1.025 1.005 - 1.030   pH 6.0 5.0 - 8.0   Glucose, UA NEGATIVE NEGATIVE mg/dL   Hgb urine dipstick MODERATE (A) NEGATIVE   Bilirubin Urine NEGATIVE NEGATIVE   Ketones, ur NEGATIVE NEGATIVE mg/dL   Protein, ur NEGATIVE NEGATIVE mg/dL   Urobilinogen, UA 0.2 0.0 - 1.0 mg/dL   Nitrite NEGATIVE NEGATIVE   Leukocytes, UA NEGATIVE NEGATIVE  Urine microscopic-add on     Status: None   Collection Time: 08/25/14  7:00 AM  Result Value Ref Range   Squamous Epithelial /  LPF RARE RARE   WBC, UA 0-2 <3 WBC/hpf   RBC / HPF 0-2 <3 RBC/hpf  CBC with Differential/Platelet     Status: Abnormal   Collection Time: 08/25/14  7:40 AM  Result Value Ref Range   WBC 3.2 (L) 4.0 - 10.5 K/uL   RBC 3.63 (L) 3.87 - 5.11 MIL/uL   Hemoglobin 9.5 (L) 12.0 - 15.0 g/dL   HCT 08.628.4 (L) 57.836.0 - 46.946.0 %   MCV 78.2 78.0 - 100.0 fL   MCH 26.2 26.0 - 34.0 pg   MCHC 33.5 30.0 - 36.0 g/dL   RDW 62.912.5 52.811.5 - 41.315.5 %   Platelets 168 150 - 400 K/uL   Neutrophils Relative % 70 43 - 77 %   Neutro Abs 2.2 1.7 - 7.7 K/uL   Lymphocytes Relative 20 12 - 46 %   Lymphs Abs 0.7 0.7 - 4.0 K/uL   Monocytes Relative 7 3 - 12 %   Monocytes Absolute 0.2 0.1 - 1.0 K/uL   Eosinophils Relative 3 0 - 5 %   Eosinophils Absolute 0.1 0.0 - 0.7 K/uL   Basophils Relative 0 0 - 1 %   Basophils Absolute 0.0 0.0 - 0.1 K/uL  hCG, quantitative, pregnancy     Status: Abnormal   Collection Time: 08/25/14  7:41  AM  Result Value Ref Range   hCG, Beta Chain, Quant, S 1898 (H) <5 mIU/mL    Comment:          GEST. AGE      CONC.  (mIU/mL)   <=1 WEEK        5 - 50     2 WEEKS       50 - 500     3 WEEKS       100 - 10,000     4 WEEKS     1,000 - 30,000     5 WEEKS     3,500 - 115,000   6-8 WEEKS     12,000 - 270,000    12 WEEKS     15,000 - 220,000        FEMALE AND NON-PREGNANT FEMALE:     LESS THAN 5 mIU/mL     [place] Ob Transvaginal  08/25/2014   CLINICAL DATA:  Abdominal pain, post termination. Question retained products of conception  EXAM: TRANSVAGINAL OB ULTRASOUND  TECHNIQUE: Transvaginal ultrasound was performed for complete evaluation of the gestation as well as the maternal uterus, adnexal regions, and pelvic cul-de-sac.  COMPARISON:  08/17/2014  FINDINGS: Intrauterine gestational sac: None visualized  Yolk sac:  Not visualized  Embryo:  Not visualized  Maternal uterus/adnexae: There is an area of slight asymmetric focal endometrial thickening measuring up to 8 mm in thickness with the remainder of the endometrium measuring 3 mm in thickness. No increased blood flow in this area of asymmetry. No convincing evidence for retained products of conception.  Ovaries are symmetric in size and echotexture. No adnexal masses. Trace amount of free fluid in the pelvis.  IMPRESSION: Area of slight endometrial asymmetry measuring focally 8 mm (not significantly thickened) without significant increase in blood flow. No convincing evidence for retained products of conception. No intrauterine pregnancy visualized.   Electronically Signed   By: Charlett NoseKevin  Dover M.D.   On: 08/25/2014 11:02    Review of Systems  Constitutional: Negative.   Respiratory: Negative.   Cardiovascular: Negative.   Gastrointestinal: Positive for nausea and abdominal pain. Negative for vomiting and constipation.  Genitourinary: Negative  for dysuria.       Positive for vaginal bleeding.  Negative for vaginal discharge.    Physical Exam    Blood pressure 121/85, pulse 74, temperature 98 F (36.7 C), temperature source Oral, resp. rate 24, last menstrual period 07/04/2014, SpO2 100 %.  Physical Exam  Constitutional: She is oriented to person, place, and time. She appears well-developed and well-nourished. She appears distressed (writhing in bed complaining of pain).  Cardiovascular: Normal rate, regular rhythm and normal heart sounds.   No murmur heard. Respiratory: Effort normal and breath sounds normal. No respiratory distress.  GI: Soft. Bowel sounds are normal. She exhibits no distension. There is tenderness. There is no rebound.  Neurological: She is alert and oriented to person, place, and time.  Skin: Skin is warm and dry. No erythema.  Psychiatric: She has a normal mood and affect.   No results found for this or any previous visit (from the past 24 hour(s)).  MAU Course  Procedures  None  MDM Dilaudid IM given - patient states no previous reaction to dilaudid. Has had hives with Percocet.  UA, quant hCG, CBC and [place] today AB+ blood type in Epic  Claudie Revering, Student-NP  08/25/2014, 7:15 AM   I have seen and evaluated the patient with the NP/PA/Med student. I agree with the assessment and plan as written above.  0800 - Care turned over to Venia Carbon, NP. Patient waiting for [place].   Marny Lowenstein, PA-C  08/25/2014 7:49 AM    Bimanual exam; scant amount of dark red blood noted on exam glove. Cervix is closed, anterior.   Assessment and Plan   A:  Vaginal bleeding post TAB Abdominal pain post TAB  P:  Discharge home in stable condition Return to the [facility] on Friday for CBC and beta hcg level due to history of anemia and significant drop in hgb. Continue ibuprofen as directed on the bottle  Bleeding precautions  Return to MAU as needed, if symptoms worsen   Iona Hansen Tayvion Lauder, NP 08/25/2014 2:43 PM

## 2014-08-27 ENCOUNTER — Other Ambulatory Visit (INDEPENDENT_AMBULATORY_CARE_PROVIDER_SITE_OTHER): Payer: Medicaid Other

## 2014-08-27 DIAGNOSIS — D508 Other iron deficiency anemias: Secondary | ICD-10-CM | POA: Diagnosis not present

## 2014-08-27 DIAGNOSIS — Z8759 Personal history of other complications of pregnancy, childbirth and the puerperium: Secondary | ICD-10-CM

## 2014-08-27 DIAGNOSIS — O045 Genital tract and pelvic infection following (induced) termination of pregnancy: Secondary | ICD-10-CM | POA: Diagnosis not present

## 2014-08-27 LAB — CBC
HCT: 29.5 % — ABNORMAL LOW (ref 36.0–46.0)
Hemoglobin: 9.3 g/dL — ABNORMAL LOW (ref 12.0–15.0)
MCH: 24.9 pg — AB (ref 26.0–34.0)
MCHC: 31.5 g/dL (ref 30.0–36.0)
MCV: 78.9 fL (ref 78.0–100.0)
PLATELETS: 188 10*3/uL (ref 150–400)
RBC: 3.74 MIL/uL — ABNORMAL LOW (ref 3.87–5.11)
RDW: 12.7 % (ref 11.5–15.5)
WBC: 3.1 10*3/uL — ABNORMAL LOW (ref 4.0–10.5)

## 2014-08-27 LAB — HCG, QUANTITATIVE, PREGNANCY: hCG, Beta Chain, Quant, S: 1004 m[IU]/mL

## 2014-08-27 MED ORDER — DOXYCYCLINE HYCLATE 100 MG PO CAPS: 100.0000 mg | ORAL_CAPSULE | Freq: Two times a day (BID) | ORAL | Status: DC

## 2014-08-27 MED ORDER — IBUPROFEN 800 MG PO TABS: 800.0000 mg | ORAL_TABLET | Freq: Three times a day (TID) | ORAL | Status: DC | PRN

## 2014-08-27 MED ORDER — METRONIDAZOLE 500 MG PO TABS: 500.0000 mg | ORAL_TABLET | Freq: Two times a day (BID) | ORAL | Status: AC

## 2014-08-27 NOTE — Progress Notes (Signed)
Patient still has abdominal pain after TAB s/p evaluation in MAU on 08/25/14.  Here today for BHCG and CBC.  08/25/2014   CLINICAL DATA:  Abdominal pain, post termination. Question retained products of conception  EXAM: TRANSVAGINAL OB ULTRASOUND  TECHNIQUE: Transvaginal ultrasound was performed for complete evaluation of the gestation as well as the maternal uterus, adnexal regions, and pelvic cul-de-sac.  COMPARISON:  08/17/2014  FINDINGS: Intrauterine gestational sac: None visualized  Yolk sac:  Not visualized  Embryo:  Not visualized  Maternal uterus/adnexae: There is an area of slight asymmetric focal endometrial thickening measuring up to 8 mm in thickness with the remainder of the endometrium measuring 3 mm in thickness. No increased blood flow in this area of asymmetry. No convincing evidence for retained products of conception.  Ovaries are symmetric in size and echotexture. No adnexal masses. Trace amount of free fluid in the pelvis.  IMPRESSION: Area of slight endometrial asymmetry measuring focally 8 mm (not significantly thickened) without significant increase in blood flow. No convincing evidence for retained products of conception. No intrauterine pregnancy visualized.   Electronically Signed   By: Charlett NoseKevin  Dover M.D.   On: 08/25/2014 11:02   UA negative from 08/25/14.  Urine GC/Chlam sent.  Patient can have post-TAB endometritis.  Will presumptively treat with Doxycycline and Flagyl. Follow up resutls and manage accordingly.  Ibuprofen also refilled for patient. Pain, fever, bleeding precautions reviewed.  Jaynie CollinsUGONNA  Susie Pousson, MD, FACOG Attending Obstetrician & Gynecologist Faculty Practice, [facility]

## 2014-08-27 NOTE — Progress Notes (Signed)
Called patient and advised her that she doesn't need any further blood test. She is going to start her antibiotics today and will let us know if she is not feeling any better. Patient had no further questions.

## 2014-08-27 NOTE — Patient Instructions (Signed)
Return to clinic for any scheduled appointments or for any gynecologic concerns as needed.   

## 2014-08-28 LAB — GC/CHLAMYDIA PROBE AMP
CT Probe RNA: NEGATIVE
GC Probe RNA: NEGATIVE

## 2014-09-27 ENCOUNTER — Emergency Department (HOSPITAL_COMMUNITY)
Admission: EM | Admit: 2014-09-27 | Discharge: 2014-09-27 | Disposition: A | Discharge status: Home / Self Care | Payer: Medicaid Other | Attending: Emergency Medicine | Admitting: Emergency Medicine

## 2014-09-27 ENCOUNTER — Emergency Department (HOSPITAL_COMMUNITY): Payer: Medicaid Other

## 2014-09-27 ENCOUNTER — Encounter (HOSPITAL_COMMUNITY): Payer: Self-pay | Admitting: Physical Medicine and Rehabilitation

## 2014-09-27 DIAGNOSIS — Z720 Tobacco use: Secondary | ICD-10-CM | POA: Insufficient documentation

## 2014-09-27 DIAGNOSIS — Z79899 Other long term (current) drug therapy: Secondary | ICD-10-CM | POA: Insufficient documentation

## 2014-09-27 DIAGNOSIS — J45909 Unspecified asthma, uncomplicated: Secondary | ICD-10-CM | POA: Insufficient documentation

## 2014-09-27 DIAGNOSIS — Z792 Long term (current) use of antibiotics: Secondary | ICD-10-CM | POA: Diagnosis not present

## 2014-09-27 DIAGNOSIS — N8300 Follicular cyst of ovary, unspecified side: Secondary | ICD-10-CM

## 2014-09-27 DIAGNOSIS — R1030 Lower abdominal pain, unspecified: Secondary | ICD-10-CM

## 2014-09-27 DIAGNOSIS — Z9889 Other specified postprocedural states: Secondary | ICD-10-CM | POA: Diagnosis not present

## 2014-09-27 DIAGNOSIS — R109 Unspecified abdominal pain: Secondary | ICD-10-CM | POA: Diagnosis present

## 2014-09-27 LAB — COMPREHENSIVE METABOLIC PANEL
ALBUMIN: 3.7 g/dL (ref 3.5–5.2)
ALT: 6 U/L (ref 0–35)
ANION GAP: 4 — AB (ref 5–15)
AST: 18 U/L (ref 0–37)
Alkaline Phosphatase: 31 U/L — ABNORMAL LOW (ref 39–117)
BILIRUBIN TOTAL: 0.7 mg/dL (ref 0.3–1.2)
BUN: 8 mg/dL (ref 6–23)
CO2: 26 mmol/L (ref 19–32)
Calcium: 8.5 mg/dL (ref 8.4–10.5)
Chloride: 106 mmol/L (ref 96–112)
Creatinine, Ser: 0.77 mg/dL (ref 0.50–1.10)
GFR calc non Af Amer: >90 mL/min (ref >90)
GLUCOSE: 94 mg/dL (ref 70–99)
POTASSIUM: 3.7 mmol/L (ref 3.5–5.1)
Sodium: 136 mmol/L (ref 135–145)
TOTAL PROTEIN: 6.9 g/dL (ref 6.0–8.3)

## 2014-09-27 LAB — URINALYSIS, ROUTINE W REFLEX MICROSCOPIC
Glucose, UA: NEGATIVE mg/dL
KETONES UR: 40 mg/dL — AB
NITRITE: POSITIVE — AB
PROTEIN: 100 mg/dL — AB
Specific Gravity, Urine: 1.026 (ref 1.005–1.030)
Urobilinogen, UA: 1 mg/dL (ref 0.0–1.0)
pH: 5.5 (ref 5.0–8.0)

## 2014-09-27 LAB — CBC WITH DIFFERENTIAL/PLATELET
BASOS ABS: 0 10*3/uL (ref 0.0–0.1)
Basophils Relative: 1 % (ref 0–1)
EOS ABS: 0.1 10*3/uL (ref 0.0–0.7)
EOS PCT: 4 % (ref 0–5)
HCT: 34.4 % — ABNORMAL LOW (ref 36.0–46.0)
HEMOGLOBIN: 11.1 g/dL — AB (ref 12.0–15.0)
LYMPHS ABS: 1.2 10*3/uL (ref 0.7–4.0)
LYMPHS PCT: 42 % (ref 12–46)
MCH: 25.9 pg — AB (ref 26.0–34.0)
MCHC: 32.3 g/dL (ref 30.0–36.0)
MCV: 80.2 fL (ref 78.0–100.0)
Monocytes Absolute: 0.2 10*3/uL (ref 0.1–1.0)
Monocytes Relative: 8 % (ref 3–12)
NEUTROS PCT: 45 % (ref 43–77)
Neutro Abs: 1.2 10*3/uL — ABNORMAL LOW (ref 1.7–7.7)
PLATELETS: 190 10*3/uL (ref 150–400)
RBC: 4.29 MIL/uL (ref 3.87–5.11)
RDW: 12.8 % (ref 11.5–15.5)
WBC: 2.8 10*3/uL — ABNORMAL LOW (ref 4.0–10.5)

## 2014-09-27 LAB — URINE MICROSCOPIC-ADD ON

## 2014-09-27 LAB — POC URINE PREG, ED: Preg Test, Ur: NEGATIVE

## 2014-09-27 MED ORDER — SODIUM CHLORIDE 0.9 % IV BOLUS (SEPSIS)
1000.0000 mL | Freq: Once | INTRAVENOUS | Status: AC
  Administered 2014-09-27: 1000 mL via INTRAVENOUS

## 2014-09-27 MED ORDER — ONDANSETRON HCL 4 MG/2ML IJ SOLN
4.0000 mg | Freq: Once | INTRAMUSCULAR | Status: AC
  Administered 2014-09-27: 4 mg via INTRAVENOUS
  Filled 2014-09-27: qty 2

## 2014-09-27 MED ORDER — HYDROCODONE-ACETAMINOPHEN 5-325 MG PO TABS: 1.0000 | ORAL_TABLET | Freq: Four times a day (QID) | ORAL | Status: DC | PRN

## 2014-09-27 MED ORDER — FERROUS SULFATE 325 (65 FE) MG PO TABS: 325.0000 mg | ORAL_TABLET | Freq: Every day | ORAL | Status: DC

## 2014-09-27 MED ORDER — IBUPROFEN 800 MG PO TABS: 800.0000 mg | ORAL_TABLET | Freq: Three times a day (TID) | ORAL | Status: DC | PRN

## 2014-09-27 MED ORDER — MORPHINE SULFATE 4 MG/ML IJ SOLN
4.0000 mg | Freq: Once | INTRAMUSCULAR | Status: AC
  Administered 2014-09-27: 4 mg via INTRAVENOUS
  Filled 2014-09-27: qty 1

## 2014-09-27 NOTE — ED Provider Notes (Signed)
CSN: [id number]     Arrival date & time 09/27/14  [id number] History   First MD Initiated Contact with Patient 09/27/14 0725     Chief Complaint  Patient presents with  . Abdominal Pain  . Near Syncope     (Consider location/radiation/quality/duration/timing/severity/associated sxs/prior Treatment) HPI   The patient is a 34 y/o female, 2 months s/p TAB (January 2016), who presents today with abdominal pain and a syncopal episode. She states that she began menstruating last night and had mild cramping relieved by ibuprofen. This is her first menstrual cycle since the TAB and she denies prior spotting. The cramping increased in intensity this morning as she got ready for work. Her pain is in the RLQ and does not radiate. It is intermittent, 8/10 severity at its worst, and never completely goes away. She cannot identify any relieving or aggravating factors. As she was leaving for work, she had an episode of severe pain, dizziness, felt "hot," and collapsed onto the couch. She did not hit her head. She reports LOC, she was easily roused by her mother. She denies confusion and memory loss. She endorses nausea without vomiting. She denies fevers, chills, dysuria. She feels generally weak without focal complaints.   She had an infection after the TAB in January 2016 for which she was treated with abx and pain medication at the [facility]. At that time she had a negative w/u for retained products. She states that this pelvic pain has resolved. She has been noted to be anemic in the past, for which she takes an iron supplement, but ran out 2 months ago. She took a home pregnancy test earlier this week that was negative.  Past Medical History  Diagnosis Date  . Asthma   . Abnormal Pap smear   . Other acne     ectopic pregnancy.    Past Surgical History  Procedure Laterality Date  . Finger surgery    . Rotator cuff repair    . Cesarean section  2005  . Colposcopy  2013  . Leep  2013  . Ectopic  pregnancy surgery     Family History  Problem Relation Age of Onset  . Diabetes Mother   . Hypertension Sister    History  Substance Use Topics  . Smoking status: Current Every Day Smoker -- 0.50 packs/day for 12 years    Types: E-cigarettes  . Smokeless tobacco: Never Used     Comment: 1-800- ouit # given  . Alcohol Use: 0.0 oz/week     Comment: socially   OB History    Gravida Para Term Preterm AB TAB SAB Ectopic Multiple Living   5 1 1  3 2  0 1  1     Review of Systems   All other systems negative except as documented in the HPI. All pertinent positives and negatives as reviewed in the HPI.  Allergies  Darvon; Percocet; and Sulfa drugs cross reactors  Home Medications   Prior to Admission medications   Medication Sig Start Date End Date Taking? Authorizing Provider  benzonatate (TESSALON) 100 MG capsule Take 1 capsule (100 mg total) by mouth 2 (two) times daily as needed for cough. Patient not taking: Reported on 08/25/2014 08/01/14   Charm RingsErin J Honig, MD  ciprofloxacin (CIPRO) 500 MG tablet Take 500 mg by mouth 2 (two) times daily.    Historical Provider, MD  doxycycline (VIBRAMYCIN) 100 MG capsule Take 1 capsule (100 mg total) by mouth 2 (two) times daily. 08/27/14  Tereso Newcomer, MD  ferrous sulfate 325 (65 FE) MG tablet Take 325 mg by mouth 3 (three) times daily as needed. Pt only takes when her cycle is going on    Historical Provider, MD  ibuprofen (ADVIL,MOTRIN) 800 MG tablet Take 1 tablet (800 mg total) by mouth every 8 (eight) hours as needed. 08/27/14   Tereso Newcomer, MD   BP 106/86 mmHg  Pulse 70  Temp(Src) 98.4 F (36.9 C) (Oral)  Resp 18  Ht 5\' 3"  (1.6 m)  SpO2 100%  LMP 07/04/2014 Physical Exam  Constitutional: She is oriented to person, place, and time. She appears well-developed and well-nourished.  HENT:  Head: Normocephalic and atraumatic.  Mouth/Throat: Oropharynx is clear and moist.  Eyes: Pupils are equal, round, and reactive to light.  Neck:  Normal range of motion. Neck supple.  Cardiovascular: Normal rate, regular rhythm and normal heart sounds.  Exam reveals no gallop and no friction rub.   No murmur heard. Pulmonary/Chest: Breath sounds normal. No respiratory distress. She has no wheezes.  Abdominal: Soft. Normal appearance and bowel sounds are normal. She exhibits no distension and no mass. There is tenderness in the right lower quadrant. There is no rebound and no guarding.    Genitourinary: There is bleeding in the vagina. No vaginal discharge found.  Lymphadenopathy:    She has no cervical adenopathy.  Neurological: She is alert and oriented to person, place, and time. She exhibits normal muscle tone. Coordination normal.  Skin: Skin is warm and dry. No rash noted. No erythema.  Nursing note and vitals reviewed.   ED Course  Procedures (including critical care time) Labs Review Labs Reviewed  CBC WITH DIFFERENTIAL/PLATELET - Abnormal; Notable for the following:    WBC 2.8 (*)    Hemoglobin 11.1 (*)    HCT 34.4 (*)    MCH 25.9 (*)    Neutro Abs 1.2 (*)    All other components within normal limits  COMPREHENSIVE METABOLIC PANEL - Abnormal; Notable for the following:    Alkaline Phosphatase 31 (*)    Anion gap 4 (*)    All other components within normal limits  URINALYSIS, ROUTINE W REFLEX MICROSCOPIC - Abnormal; Notable for the following:    Color, Urine RED (*)    APPearance CLOUDY (*)    Hgb urine dipstick LARGE (*)    Bilirubin Urine LARGE (*)    Ketones, ur 40 (*)    Protein, ur 100 (*)    Nitrite POSITIVE (*)    Leukocytes, UA MODERATE (*)    All other components within normal limits  URINE MICROSCOPIC-ADD ON  POC URINE PREG, ED  POC URINE PREG, ED    Imaging Review US Transvaginal Non-ob  09/27/2014   CLINICAL DATA:  Lower abdominal pain, right-sided time initial encounter.  EXAM: TRANSABDOMINAL AND TRANSVAGINAL ULTRASOUND OF PELVIS  DOPPLER ULTRASOUND OF OVARIES  TECHNIQUE: Both transabdominal  and transvaginal ultrasound examinations of the pelvis were performed. Transabdominal technique was performed for global imaging of the pelvis including uterus, ovaries, adnexal regions, and pelvic cul-de-sac.  It was necessary to proceed with endovaginal exam following the transabdominal exam to visualize the uterus, endometrium, ovaries and adnexal regions. Color and duplex Doppler ultrasound was utilized to evaluate blood flow to the ovaries.  COMPARISON:  08/25/2014.  FINDINGS: Uterus  Measurements: 8.2 x 4.5 x 4.5 cm. Parenchymal echogenicity is uniform. No fibroids are mass.  Endometrium  Thickness: 7 mm, within normal limits. No focal abnormality visualized.  Right ovary  Measurements: 4.4 x 3.6 x 2.1 cm. Normal appearance/no adnexal mass. There may be a small 1.7 cm hemorrhagic follicle contained within, of questionable clinical significance with respect to the patient's history of right-sided lower abdominal pain.  Left ovary  Measurements: 2.9 x 1.8 x 2.2 cm. Normal appearance/no adnexal mass.  Pulsed Doppler evaluation of both ovaries demonstrates normal low-resistance arterial and venous waveforms.  Other findings  No free fluid.  IMPRESSION: No evidence of ovarian torsion.   Electronically Signed   By: Leanna Battles M.D.   On: 09/27/2014 10:10   US Pelvis Complete  09/27/2014   CLINICAL DATA:  Lower abdominal pain, right-sided time initial encounter.  EXAM: TRANSABDOMINAL AND TRANSVAGINAL ULTRASOUND OF PELVIS  DOPPLER ULTRASOUND OF OVARIES  TECHNIQUE: Both transabdominal and transvaginal ultrasound examinations of the pelvis were performed. Transabdominal technique was performed for global imaging of the pelvis including uterus, ovaries, adnexal regions, and pelvic cul-de-sac.  It was necessary to proceed with endovaginal exam following the transabdominal exam to visualize the uterus, endometrium, ovaries and adnexal regions. Color and duplex Doppler ultrasound was utilized to evaluate blood flow to  the ovaries.  COMPARISON:  08/25/2014.  FINDINGS: Uterus  Measurements: 8.2 x 4.5 x 4.5 cm. Parenchymal echogenicity is uniform. No fibroids are mass.  Endometrium  Thickness: 7 mm, within normal limits. No focal abnormality visualized.  Right ovary  Measurements: 4.4 x 3.6 x 2.1 cm. Normal appearance/no adnexal mass. There may be a small 1.7 cm hemorrhagic follicle contained within, of questionable clinical significance with respect to the patient's history of right-sided lower abdominal pain.  Left ovary  Measurements: 2.9 x 1.8 x 2.2 cm. Normal appearance/no adnexal mass.  Pulsed Doppler evaluation of both ovaries demonstrates normal low-resistance arterial and venous waveforms.  Other findings  No free fluid.  IMPRESSION: No evidence of ovarian torsion.   Electronically Signed   By: Leanna Battles M.D.   On: 09/27/2014 10:10   [place] Art/ven Flow Abd Pelv Doppler  09/27/2014   CLINICAL DATA:  Lower abdominal pain, right-sided time initial encounter.  EXAM: TRANSABDOMINAL AND TRANSVAGINAL ULTRASOUND OF PELVIS  DOPPLER ULTRASOUND OF OVARIES  TECHNIQUE: Both transabdominal and transvaginal ultrasound examinations of the pelvis were performed. Transabdominal technique was performed for global imaging of the pelvis including uterus, ovaries, adnexal regions, and pelvic cul-de-sac.  It was necessary to proceed with endovaginal exam following the transabdominal exam to visualize the uterus, endometrium, ovaries and adnexal regions. Color and duplex Doppler ultrasound was utilized to evaluate blood flow to the ovaries.  COMPARISON:  08/25/2014.  FINDINGS: Uterus  Measurements: 8.2 x 4.5 x 4.5 cm. Parenchymal echogenicity is uniform. No fibroids are mass.  Endometrium  Thickness: 7 mm, within normal limits. No focal abnormality visualized.  Right ovary  Measurements: 4.4 x 3.6 x 2.1 cm. Normal appearance/no adnexal mass. There may be a small 1.7 cm hemorrhagic follicle contained within, of questionable clinical  significance with respect to the patient's history of right-sided lower abdominal pain.  Left ovary  Measurements: 2.9 x 1.8 x 2.2 cm. Normal appearance/no adnexal mass.  Pulsed Doppler evaluation of both ovaries demonstrates normal low-resistance arterial and venous waveforms.  Other findings  No free fluid.  IMPRESSION: No evidence of ovarian torsion.   Electronically Signed   By: Leanna Battles M.D.   On: 09/27/2014 10:10      MDM   Final diagnoses:  None    Patient is referred back to the [facility] hospital clinic.  She  has an atypical presentation for appendicitis.  She does have pain in the right lower pelvic region.  It seems to correlate with the hemorrhagic follicle ultrasound.  Patient is currently on her menstrual cycle  Charlestine Night, PA-C 09/28/14 1557  Charlestine Night, PA-C 09/28/14 1557  Richardean Canal, MD 09/28/14 [phone]

## 2014-09-27 NOTE — ED Notes (Signed)
Pt presents to department for evaluation of lower abdominal pain and near syncope. States she was getting ready for work this morning when she began having lower abdominal cramps, reports she is on period. States she almost passed out due to the pain. Pt is alert and oriented x4.

## 2014-09-27 NOTE — ED Notes (Addendum)
Per pt, pt reports increase in abd pain this morning with dizziness and syncopal episode. Pt reports experiencing a miscarriage in January, this is the first period she has had since and it is worse than usual. Pt reports increased fatigue over the past few days. Pt took ibuprofen last night.

## 2014-09-27 NOTE — Discharge Instructions (Signed)
Return here as needed.  Follow-up with the [facility] clinic. °

## 2014-10-07 ENCOUNTER — Encounter: Payer: Self-pay | Admitting: Family Medicine

## 2014-10-07 ENCOUNTER — Ambulatory Visit (INDEPENDENT_AMBULATORY_CARE_PROVIDER_SITE_OTHER): Payer: Medicaid Other | Admitting: Family Medicine

## 2014-10-07 VITALS — BP 119/81 | HR 74 | Temp 98.0°F | Ht 63.0 in | Wt 155.2 lb

## 2014-10-07 DIAGNOSIS — N8320 Unspecified ovarian cysts: Secondary | ICD-10-CM

## 2014-10-07 DIAGNOSIS — N83209 Unspecified ovarian cyst, unspecified side: Secondary | ICD-10-CM

## 2014-10-07 NOTE — Progress Notes (Signed)
   Subjective:    Patient ID: Julia DowdySophia L Bautista, female    DOB: February 13, 1981, 34 y.o.   MRN: [id number]  HPI Patient seen for abdominal pain. She is a G5 P1 041 who underwent miscarriage couple months ago. The patient was seen the beginning of March for right sided pelvic pain and was diagnosed with a 2 and half centimeter right hemorrhagic ovarian cyst. Patient was referred to our clinic for follow-up. Patient gets this pain every month has been seen in the emergency department several times. Pain is severe noise on the right side. The pain last for several days and then resolves. Patient is currently pain-free. She has normal menses. She is currently attempting to become pregnant.  Review of Systems  Constitutional: Negative for fever, chills and fatigue.  Gastrointestinal: Negative for nausea, vomiting, abdominal pain, diarrhea, constipation and rectal pain.  Genitourinary: Negative for dysuria, hematuria, decreased urine volume, vaginal bleeding, vaginal discharge, vaginal pain, menstrual problem and dyspareunia.       Objective:   Physical Exam  Constitutional: She appears well-developed and well-nourished.  Pulmonary/Chest: Effort normal and breath sounds normal. No respiratory distress. She has no wheezes. She has no rales. She exhibits no tenderness.  Abdominal: Soft. Bowel sounds are normal. She exhibits no distension and no mass. There is no tenderness. There is no rebound and no guarding.  Skin: Skin is warm and dry.  Psychiatric: She has a normal mood and affect. Her behavior is normal. Judgment and thought content normal.     Assessment & Plan:   Problem List Items Addressed This Visit    None    Visit Diagnoses    Hemorrhagic cyst of ovary    -  Primary    Relevant Orders    US Pelvis Complete      Discussed options. Will follow-up with pelvic ultrasound to assure resolution of hemorrhagic cyst. Discussed treatment with oral contraception although if the patient is  attempting to become pregnant, this would not be beneficial to her family planning. Follow-up after ultrasound if needed.

## 2014-11-03 ENCOUNTER — Other Ambulatory Visit: Payer: Self-pay | Admitting: Family Medicine

## 2014-11-03 ENCOUNTER — Ambulatory Visit (HOSPITAL_COMMUNITY)
Admission: RE | Admit: 2014-11-03 | Discharge: 2014-11-03 | Disposition: A | Discharge status: Home / Self Care | Payer: Medicaid Other | Source: Ambulatory Visit | Attending: Family Medicine | Admitting: Family Medicine

## 2014-11-03 DIAGNOSIS — N83209 Unspecified ovarian cyst, unspecified side: Secondary | ICD-10-CM

## 2014-11-03 DIAGNOSIS — N8320 Unspecified ovarian cysts: Secondary | ICD-10-CM | POA: Insufficient documentation

## 2015-01-19 ENCOUNTER — Ambulatory Visit (INDEPENDENT_AMBULATORY_CARE_PROVIDER_SITE_OTHER): Payer: Medicaid Other | Admitting: Obstetrics & Gynecology

## 2015-01-19 ENCOUNTER — Encounter: Payer: Self-pay | Admitting: Obstetrics & Gynecology

## 2015-01-19 ENCOUNTER — Other Ambulatory Visit (HOSPITAL_COMMUNITY)
Admission: RE | Admit: 2015-01-19 | Discharge: 2015-01-19 | Disposition: A | Discharge status: Home / Self Care | Payer: Medicaid Other | Source: Ambulatory Visit | Attending: Obstetrics & Gynecology | Admitting: Obstetrics & Gynecology

## 2015-01-19 VITALS — BP 124/62 | HR 84 | Temp 98.1°F | Resp 20 | Ht 62.0 in | Wt 166.1 lb

## 2015-01-19 DIAGNOSIS — Z01411 Encounter for gynecological examination (general) (routine) with abnormal findings: Secondary | ICD-10-CM | POA: Insufficient documentation

## 2015-01-19 DIAGNOSIS — Z01419 Encounter for gynecological examination (general) (routine) without abnormal findings: Secondary | ICD-10-CM

## 2015-01-19 DIAGNOSIS — Z113 Encounter for screening for infections with a predominantly sexual mode of transmission: Secondary | ICD-10-CM | POA: Diagnosis present

## 2015-01-19 DIAGNOSIS — Z1151 Encounter for screening for human papillomavirus (HPV): Secondary | ICD-10-CM | POA: Diagnosis present

## 2015-01-19 DIAGNOSIS — N644 Mastodynia: Secondary | ICD-10-CM

## 2015-01-19 DIAGNOSIS — R87810 Cervical high risk human papillomavirus (HPV) DNA test positive: Secondary | ICD-10-CM | POA: Insufficient documentation

## 2015-01-19 NOTE — Patient Instructions (Signed)

## 2015-01-19 NOTE — Progress Notes (Signed)
Patient ID: Julia Bautista, female   DOB: [birth year], 34 y.o.   MRN: [id number]  Chief Complaint  Patient presents with  . Gynecologic Exam  cc:Pt reports sharp bilateral breast pain x5 days. She also desires refill Rx of Ferrous sulfate. She desires testing for all STI's today.   HPI Julia DowdySophia L Bautista is a 34 y.o. female.  [id number] Patient's last menstrual period was 01/11/2015 (exact date). [id number] For well woman exam, noted sharp bilateral breast pains after last period. Nl menses, does not want Rx BCM.  HPI  Past Medical History  Diagnosis Date  . Asthma   . Abnormal Pap smear   . Other acne     ectopic pregnancy.     Past Surgical History  Procedure Laterality Date  . Finger surgery    . Rotator cuff repair    . Cesarean section  2005  . Colposcopy  2013  . Leep  2013  . Ectopic pregnancy surgery      Family History  Problem Relation Age of Onset  . Diabetes Mother   . Hypertension Sister     Social History History  Substance Use Topics  . Smoking status: Current Every Day Smoker -- 0.50 packs/day for 12 years    Types: E-cigarettes  . Smokeless tobacco: Never Used     Comment: 1-800- ouit # given  . Alcohol Use: 0.0 oz/week     Comment: socially    Allergies  Allergen Reactions  . Darvon [Propoxyphene] Itching  . Percocet [Oxycodone-Acetaminophen] Hives  . Sulfa Drugs Cross Reactors Hives    Current Outpatient Prescriptions  Medication Sig Dispense Refill  . ferrous sulfate 325 (65 FE) MG tablet Take 1 tablet (325 mg total) by mouth daily. 30 tablet 0   No current facility-administered medications for this visit.    Review of Systems Review of Systems  Constitutional:       Breast pain  Respiratory: Negative.   Cardiovascular: Negative for chest pain.  Gastrointestinal: Negative.   Genitourinary: Negative.   Musculoskeletal: Negative.     Blood pressure 124/62, pulse 84, temperature 98.1 F (36.7 C), temperature source Oral, resp. rate 20,  height 5\' 2"  (1.575 m), weight 166 lb 1.6 oz (75.342 kg), last menstrual period 01/11/2015.  Physical Exam Physical Exam  Constitutional: She is oriented to person, place, and time. She appears well-developed. No distress.  Cardiovascular: Normal rate.   Pulmonary/Chest: Effort normal. No respiratory distress.  Breasts not tender not mass, nl axilla, skin nl  Abdominal: Soft. She exhibits no distension. There is no tenderness.  Genitourinary: Vagina normal and uterus normal. No vaginal discharge found.  No mass, pap done.  Neurological: She is alert and oriented to person, place, and time.  Skin: Skin is warm and dry.  Psychiatric: She has a normal mood and affect. Her behavior is normal.  Vitals reviewed.   Data Reviewed Pap 2013, offic enote 09/2014  Assessment    Nl well woman exam Breast exam nl with breast pain reported Does not want contraception, F/U pap after LSIL and LEEP nl     Plan    If nl repeat pap 1 year STD test per her request today incl HIV    Report if breast sx persist    Julia Bautista 01/19/2015, 2:01 PM

## 2015-01-19 NOTE — Progress Notes (Signed)
Pt reports sharp bilateral breast pain x5 days. She also desires refill Rx of Ferrous sulfate. She desires testing for all STI's today.

## 2015-01-20 LAB — HIV ANTIBODY (ROUTINE TESTING W REFLEX): HIV 1&2 Ab, 4th Generation: NONREACTIVE

## 2015-01-20 LAB — RPR

## 2015-01-20 LAB — HEPATITIS B SURFACE ANTIGEN: HEP B S AG: NEGATIVE

## 2015-01-20 LAB — CYTOLOGY - PAP

## 2015-11-01 IMAGING — US US OB TRANSVAGINAL
1 series · 14 of 28 positions shown · non-contrast
Comparison: 08/02/2014

CLINICAL DATA: Pregnant, pelvic pain

EXAM:
TRANSVAGINAL OB ULTRASOUND
TECHNIQUE: Transvaginal ultrasound was performed for complete evaluation of the
gestation as well as the maternal uterus, adnexal regions, and
pelvic cul-de-sac.

[Series 1: us ob transvaginal · 0.11mm/px · 14 of 66 slices shown]
[im 3/66]
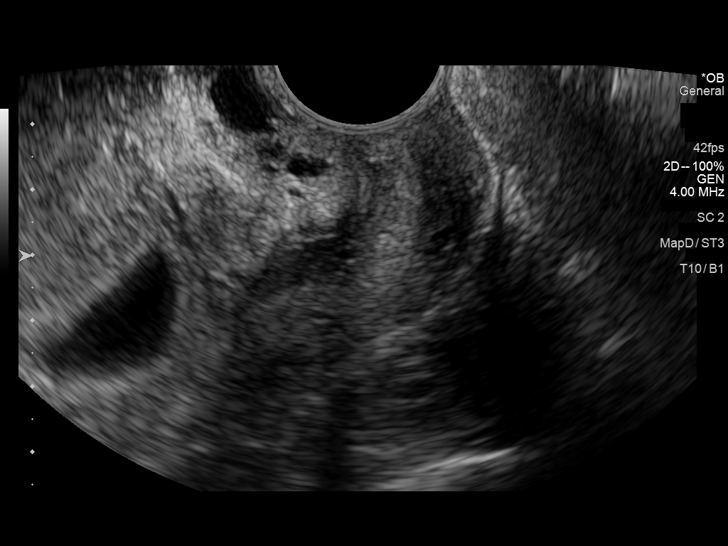
[im 8/66]
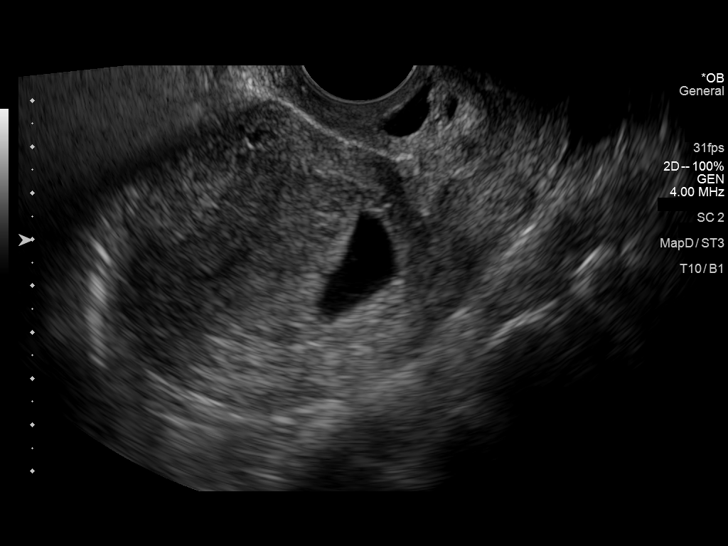
[im 13/66]
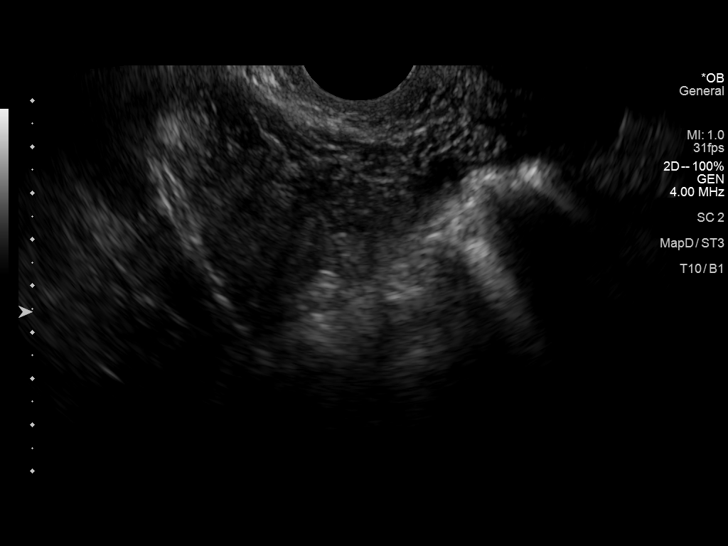
[im 17/66]
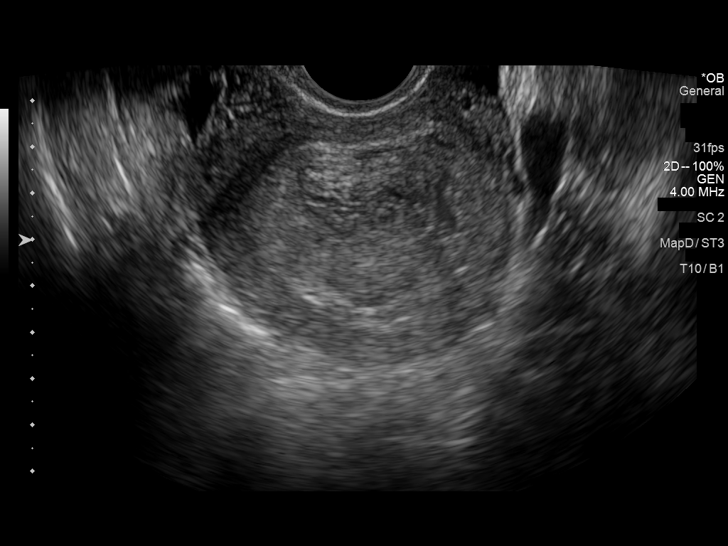
[im 22/66]
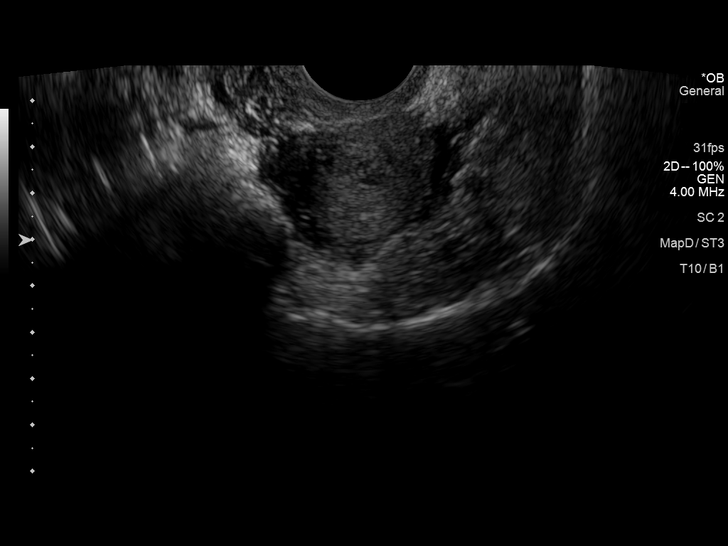
[im 27/66]
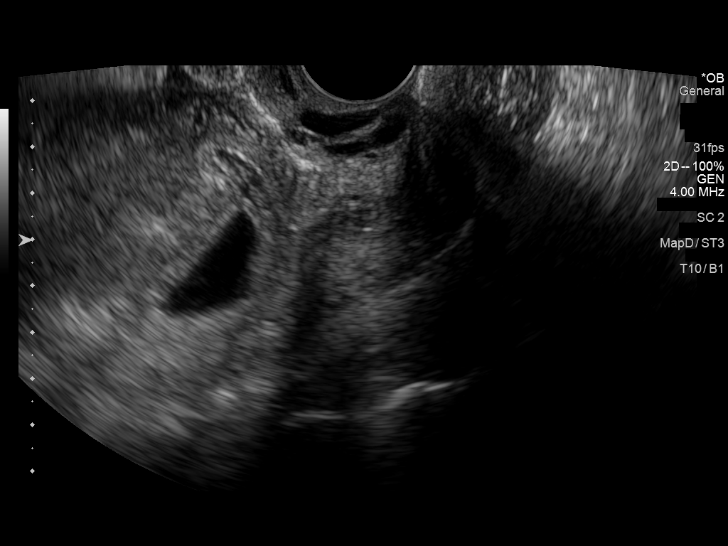
[im 32/66]
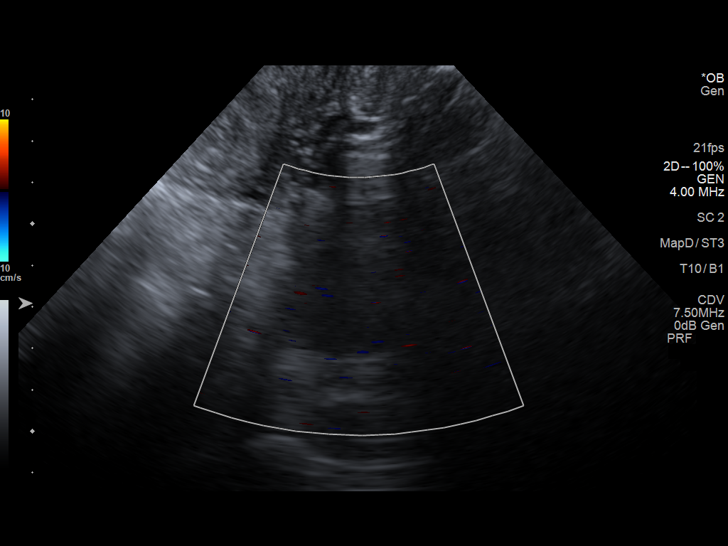
[im 37/66]
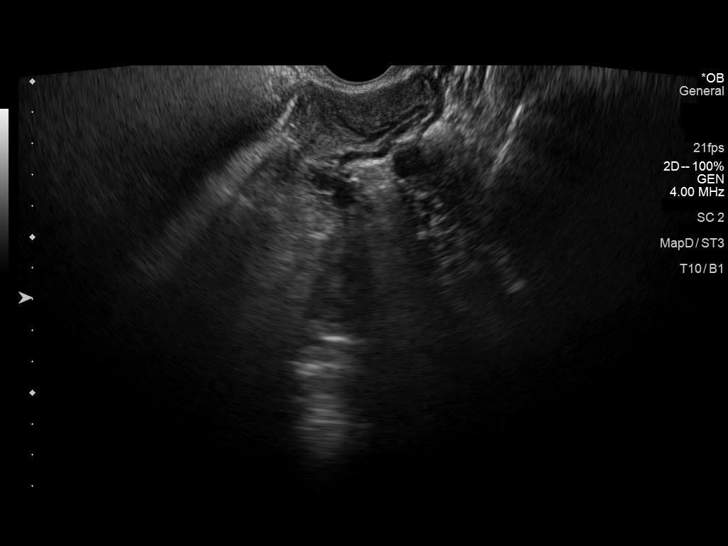
[im 41/66]
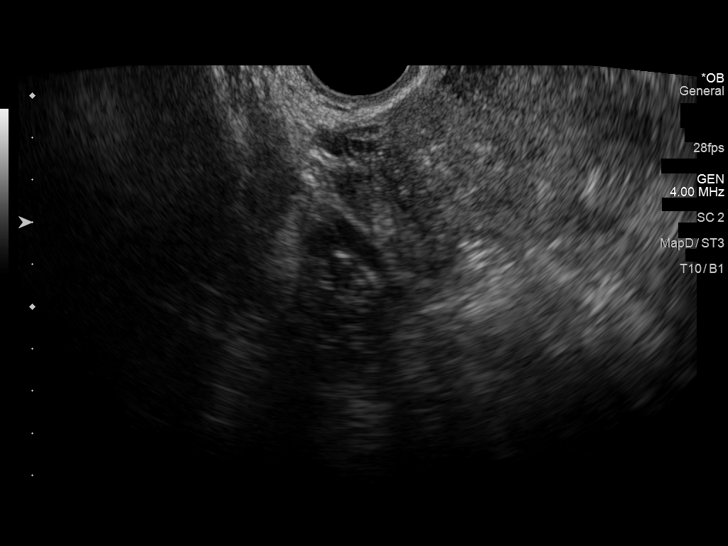
[im 46/66]
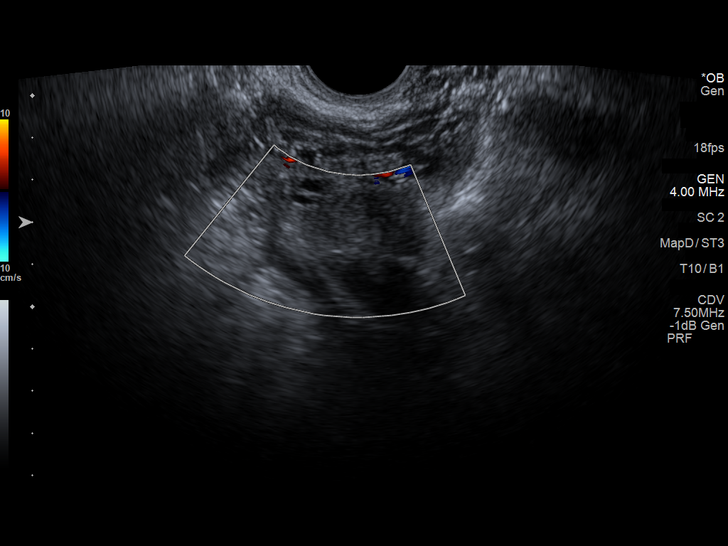
[im 51/66]
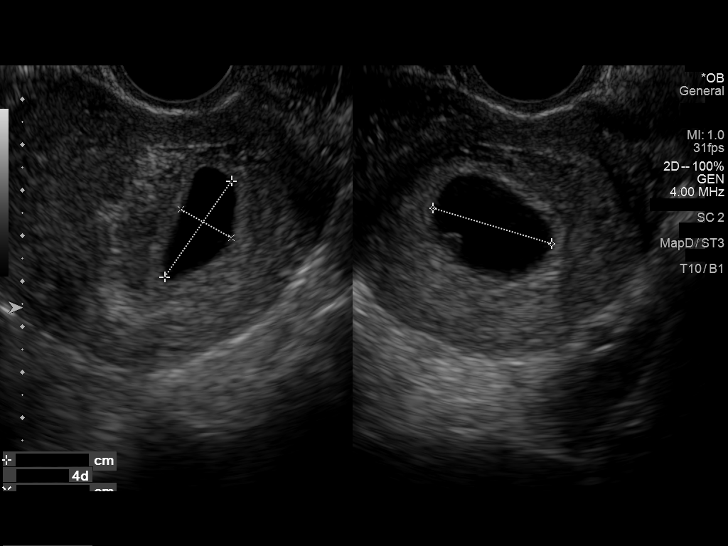
[im 56/66]
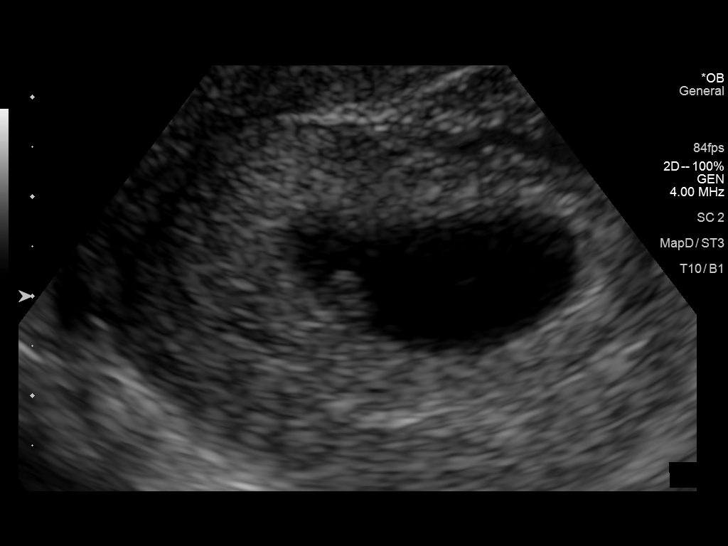
[im 61/66]
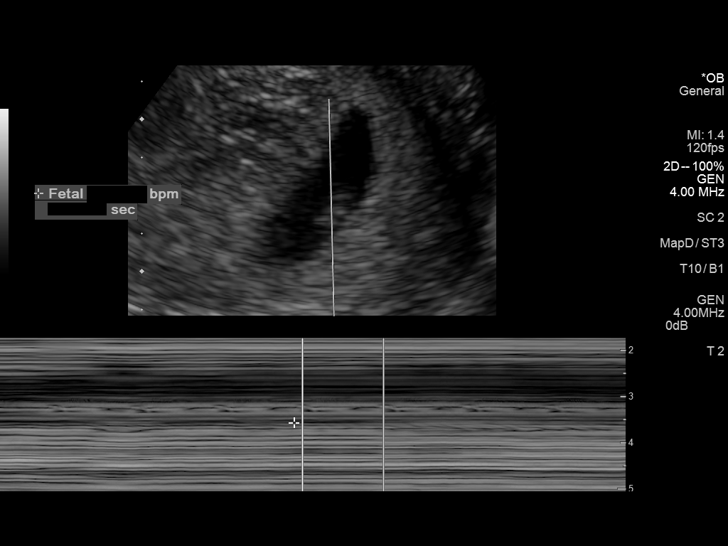
[im 66/66]
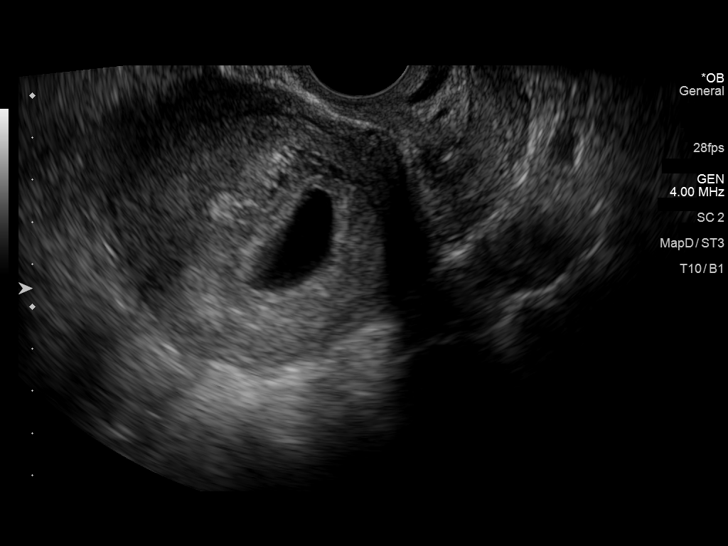

[14 of 28 positions shown; findings below may reference images not displayed]

FINDINGS: Intrauterine gestational sac: Visualized/normal in shape.

Yolk sac:  Present

Embryo:  Present

Cardiac Activity: Present

Heart Rate: 126 bpm

CRL:   6.2  mm   6 w 3 d                  US EDC: 04/09/2015

Maternal uterus/adnexae: Small subchorionic hemorrhage.

Bilateral ovaries are within normal limits.

Trace pelvic fluid.
IMPRESSION: Single live intrauterine gestation with estimated gestational age 6
weeks 3 days by crown-rump length.

## 2015-11-23 ENCOUNTER — Inpatient Hospital Stay (HOSPITAL_COMMUNITY): Payer: Medicaid Other

## 2015-11-23 ENCOUNTER — Inpatient Hospital Stay (HOSPITAL_COMMUNITY)
Admission: AD | Admit: 2015-11-23 | Discharge: 2015-11-23 | Disposition: A | Discharge status: Home / Self Care | Payer: Medicaid Other | Source: Ambulatory Visit | Attending: Obstetrics and Gynecology | Admitting: Obstetrics and Gynecology

## 2015-11-23 DIAGNOSIS — Z885 Allergy status to narcotic agent status: Secondary | ICD-10-CM | POA: Diagnosis not present

## 2015-11-23 DIAGNOSIS — Z882 Allergy status to sulfonamides status: Secondary | ICD-10-CM | POA: Insufficient documentation

## 2015-11-23 DIAGNOSIS — J45909 Unspecified asthma, uncomplicated: Secondary | ICD-10-CM | POA: Insufficient documentation

## 2015-11-23 DIAGNOSIS — R102 Pelvic and perineal pain: Secondary | ICD-10-CM

## 2015-11-23 DIAGNOSIS — F17210 Nicotine dependence, cigarettes, uncomplicated: Secondary | ICD-10-CM | POA: Diagnosis not present

## 2015-11-23 DIAGNOSIS — Z791 Long term (current) use of non-steroidal anti-inflammatories (NSAID): Secondary | ICD-10-CM | POA: Insufficient documentation

## 2015-11-23 DIAGNOSIS — N83201 Unspecified ovarian cyst, right side: Secondary | ICD-10-CM | POA: Diagnosis not present

## 2015-11-23 DIAGNOSIS — Z888 Allergy status to other drugs, medicaments and biological substances status: Secondary | ICD-10-CM | POA: Insufficient documentation

## 2015-11-23 DIAGNOSIS — R1032 Left lower quadrant pain: Secondary | ICD-10-CM | POA: Diagnosis present

## 2015-11-23 DIAGNOSIS — R1031 Right lower quadrant pain: Secondary | ICD-10-CM | POA: Insufficient documentation

## 2015-11-23 DIAGNOSIS — Z79899 Other long term (current) drug therapy: Secondary | ICD-10-CM | POA: Insufficient documentation

## 2015-11-23 LAB — CBC WITH DIFFERENTIAL/PLATELET
Basophils Absolute: 0 10*3/uL (ref 0.0–0.1)
Basophils Relative: 1 %
EOS ABS: 0 10*3/uL (ref 0.0–0.7)
Eosinophils Relative: 1 %
HEMATOCRIT: 33.5 % — AB (ref 36.0–46.0)
HEMOGLOBIN: 10.9 g/dL — AB (ref 12.0–15.0)
LYMPHS ABS: 1.2 10*3/uL (ref 0.7–4.0)
LYMPHS PCT: 33 %
MCH: 25.2 pg — AB (ref 26.0–34.0)
MCHC: 32.5 g/dL (ref 30.0–36.0)
MCV: 77.5 fL — ABNORMAL LOW (ref 78.0–100.0)
MONOS PCT: 7 %
Monocytes Absolute: 0.2 10*3/uL (ref 0.1–1.0)
NEUTROS PCT: 60 %
Neutro Abs: 2.1 10*3/uL (ref 1.7–7.7)
Platelets: 198 10*3/uL (ref 150–400)
RBC: 4.32 MIL/uL (ref 3.87–5.11)
RDW: 13 % (ref 11.5–15.5)
WBC: 3.5 10*3/uL — ABNORMAL LOW (ref 4.0–10.5)

## 2015-11-23 LAB — COMPREHENSIVE METABOLIC PANEL
ALK PHOS: 35 U/L — AB (ref 38–126)
ALT: 8 U/L — ABNORMAL LOW (ref 14–54)
AST: 20 U/L (ref 15–41)
Albumin: 4.3 g/dL (ref 3.5–5.0)
Anion gap: 4 — ABNORMAL LOW (ref 5–15)
BILIRUBIN TOTAL: 0.4 mg/dL (ref 0.3–1.2)
BUN: 10 mg/dL (ref 6–20)
CALCIUM: 8.8 mg/dL — AB (ref 8.9–10.3)
CO2: 24 mmol/L (ref 22–32)
Chloride: 106 mmol/L (ref 101–111)
Creatinine, Ser: 0.77 mg/dL (ref 0.44–1.00)
GFR calc non Af Amer: >60 mL/min (ref >60)
Glucose, Bld: 97 mg/dL (ref 65–99)
Potassium: 4.2 mmol/L (ref 3.5–5.1)
SODIUM: 134 mmol/L — AB (ref 135–145)
TOTAL PROTEIN: 7.7 g/dL (ref 6.5–8.1)

## 2015-11-23 LAB — URINALYSIS, ROUTINE W REFLEX MICROSCOPIC
Bilirubin Urine: NEGATIVE
Glucose, UA: NEGATIVE mg/dL
Hgb urine dipstick: NEGATIVE
Ketones, ur: NEGATIVE mg/dL
NITRITE: NEGATIVE
PH: 8 (ref 5.0–8.0)
Protein, ur: NEGATIVE mg/dL
Specific Gravity, Urine: 1.02 (ref 1.005–1.030)

## 2015-11-23 LAB — POCT PREGNANCY, URINE: Preg Test, Ur: NEGATIVE

## 2015-11-23 LAB — WET PREP, GENITAL
Sperm: NONE SEEN
TRICH WET PREP: NONE SEEN
Yeast Wet Prep HPF POC: NONE SEEN

## 2015-11-23 LAB — URINE MICROSCOPIC-ADD ON: RBC / HPF: NONE SEEN RBC/hpf (ref 0–5)

## 2015-11-23 MED ORDER — KETOROLAC TROMETHAMINE 60 MG/2ML IM SOLN
60.0000 mg | Freq: Once | INTRAMUSCULAR | Status: AC
  Administered 2015-11-23: 60 mg via INTRAMUSCULAR
  Filled 2015-11-23: qty 2

## 2015-11-23 MED ORDER — NAPROXEN 500 MG PO TABS: 500.0000 mg | ORAL_TABLET | Freq: Two times a day (BID) | ORAL | Status: DC

## 2015-11-23 NOTE — MAU Note (Signed)
Pt states she has been having sharp abd pain since last Thurs.  Pt states it's so bad that it caused her to double over.  Denies vaginal bleeding or abnormal discharge.  Noted some burning with urination on Sat but none since.  Home UPT was negative.

## 2015-11-23 NOTE — Discharge Instructions (Signed)
Call the [facility] Patient [facility] Clinic here at [facility] to schedule follow up for 6 weeks. They may want to repeat the ultrasound at that time to be sure the cyst resolved. Return sooner if symptoms worsen.

## 2015-11-23 NOTE — MAU Provider Note (Signed)
CSN: [id number]     Arrival date & time 11/23/15  1401 History   None    Chief Complaint  Patient presents with  . Abdominal Pain     (Consider location/radiation/quality/duration/timing/severity/associated sxs/prior Treatment) Abdominal Pain This is a new problem. The current episode started in the past 7 days. The onset quality is gradual. The problem occurs constantly. The problem has been gradually worsening. The pain is located in the LLQ and RLQ. The pain is at a severity of 4/10. The pain is mild. The quality of the pain is sharp. The abdominal pain does not radiate. Associated symptoms include constipation, dysuria, flatus and nausea. Pertinent negatives include no anorexia, diarrhea, fever, frequency, headaches, hematuria or vomiting. The pain is relieved by nothing. She has tried nothing for the symptoms.   Julia Bautista is a 35 y.o. [phone] who presents to the MAU with lower abdominal pain that started a week ago and has gotten worse. LMP 11/04/15, last pap 1 year ago and was normal. Current sex partner x 4 years. No hx of STI, no birth control.  Past Medical History  Diagnosis Date  . Asthma   . Abnormal Pap smear   . Other acne     ectopic pregnancy.    Past Surgical History  Procedure Laterality Date  . Finger surgery    . Rotator cuff repair    . Cesarean section  2005  . Colposcopy  2013  . Leep  2013  . Ectopic pregnancy surgery     Family History  Problem Relation Age of Onset  . Diabetes Mother   . Hypertension Sister    Social History  Substance Use Topics  . Smoking status: Current Every Day Smoker -- 0.50 packs/day for 12 years    Types: E-cigarettes  . Smokeless tobacco: Never Used     Comment: 1-800- ouit # given  . Alcohol Use: 0.0 oz/week     Comment: socially   OB History    Gravida Para Term Preterm AB TAB SAB Ectopic Multiple Living   Review of Systems  Constitutional: Negative for fever.  Gastrointestinal:  Positive for nausea, abdominal pain, constipation and flatus. Negative for vomiting, diarrhea and anorexia.  Genitourinary: Positive for dysuria. Negative for frequency and hematuria.  Neurological: Negative for headaches.      Allergies  Darvon; Percocet; and Sulfa drugs cross reactors  Home Medications   Prior to Admission medications   Medication Sig Start Date End Date Taking? Authorizing Provider  albuterol (PROVENTIL HFA;VENTOLIN HFA) 108 (90 Base) MCG/ACT inhaler Inhale 2 puffs into the lungs every 6 (six) hours as needed for wheezing or shortness of breath.   Yes Historical Provider, MD  ferrous sulfate 325 (65 FE) MG tablet Take 1 tablet (325 mg total) by mouth daily. 09/27/14  Yes Christopher Lawyer, PA-C  ibuprofen (ADVIL,MOTRIN) 200 MG tablet Take 400 mg by mouth every 6 (six) hours as needed.   Yes Historical Provider, MD  naproxen (NAPROSYN) 500 MG tablet Take 1 tablet (500 mg total) by mouth 2 (two) times daily. 11/23/15   Hope Orlene Och, NP   BP 116/79 mmHg  Pulse 76  Temp(Src) 98.5 F (36.9 C) (Oral)  Resp 18  Ht 5' 2.5" (1.588 m)  Wt 159 lb 6.4 oz (72.303 kg)  BMI 28.67 kg/m2  SpO2 100%  LMP 11/04/2015 Physical Exam  Constitutional: She is oriented to person, place, and time.  She appears well-developed and well-nourished.  HENT:  Head: Normocephalic and atraumatic.  Eyes: EOM are normal.  Neck: Neck supple.  Cardiovascular: Normal rate.   Pulmonary/Chest: Effort normal.  Abdominal: Soft. There is tenderness in the right lower quadrant. There is no rigidity, no rebound, no guarding and no CVA tenderness.  Genitourinary:  External genitalia without lesions. White d/c vaginal vault. Positive CMT, right adnexal tenderness and fullness. Uterus without palpable enlargement.   Musculoskeletal: Normal range of motion.  Neurological: She is alert and oriented to person, place, and time. No cranial nerve deficit.  Skin: Skin is warm and dry.  Psychiatric: She has a normal  mood and affect. Her behavior is normal.  Nursing note and vitals reviewed.   ED Course  Procedures (including critical care time) Labs Review Labs Reviewed  WET PREP, GENITAL - Abnormal; Notable for the following:    Clue Cells Wet Prep HPF POC PRESENT (*)    WBC, Wet Prep HPF POC MODERATE (*)    All other components within normal limits  URINALYSIS, ROUTINE W REFLEX MICROSCOPIC (NOT AT [facility]) - Abnormal; Notable for the following:    APPearance HAZY (*)    Leukocytes, UA TRACE (*)    All other components within normal limits  CBC WITH DIFFERENTIAL/PLATELET - Abnormal; Notable for the following:    WBC 3.5 (*)    Hemoglobin 10.9 (*)    HCT 33.5 (*)    MCV 77.5 (*)    MCH 25.2 (*)    All other components within normal limits  COMPREHENSIVE METABOLIC PANEL - Abnormal; Notable for the following:    Sodium 134 (*)    Calcium 8.8 (*)    ALT 8 (*)    Alkaline Phosphatase 35 (*)    Anion gap 4 (*)    All other components within normal limits  URINE MICROSCOPIC-ADD ON - Abnormal; Notable for the following:    Squamous Epithelial / LPF 6-30 (*)    Bacteria, UA MANY (*)    All other components within normal limits  HIV ANTIBODY (ROUTINE TESTING)  POCT PREGNANCY, URINE  GC/CHLAMYDIA PROBE AMP ([place]) NOT AT [facility]    Imaging Review US Transvaginal Non-ob  11/23/2015  CLINICAL DATA:  Pelvic pain, prior C-section EXAM: TRANSABDOMINAL AND TRANSVAGINAL ULTRASOUND OF PELVIS TECHNIQUE: Both transabdominal and transvaginal ultrasound examinations of the pelvis were performed. Transabdominal technique was performed for global imaging of the pelvis including uterus, ovaries, adnexal regions, and pelvic cul-de-sac. It was necessary to proceed with endovaginal exam following the transabdominal exam to visualize the endometrium. COMPARISON:  11/03/2014 FINDINGS: Uterus Measurements: 8.1 x 4.7 x 4.6 cm. No fibroids or other mass visualized. Endometrium Thickness: 8 mm.  No focal abnormality  visualized. Right ovary Measurements: 4.9 x 4.5 x 4.5 cm. 3.3 x 3.9 x 3.7 cm hemorrhagic cyst. Left ovary Measurements: 2.7 x 1.4 x 2.7 cm. Normal appearance/no adnexal mass. Other findings Small pelvic ascites, likely physiologic. IMPRESSION: 3.9 cm right ovarian hemorrhagic cyst, likely physiologic. Clinically warranted, follow-up pelvic ultrasound can be performed in 6-12 weeks. Electronically Signed   By: Charline Bills M.D.   On: 11/23/2015 16:03   US Pelvis Complete  11/23/2015  CLINICAL DATA:  Pelvic pain, prior C-section EXAM: TRANSABDOMINAL AND TRANSVAGINAL ULTRASOUND OF PELVIS TECHNIQUE: Both transabdominal and transvaginal ultrasound examinations of the pelvis were performed. Transabdominal technique was performed for global imaging of the pelvis including uterus, ovaries, adnexal regions, and pelvic cul-de-sac. It was necessary to proceed with endovaginal exam following  the transabdominal exam to visualize the endometrium. COMPARISON:  11/03/2014 FINDINGS: Uterus Measurements: 8.1 x 4.7 x 4.6 cm. No fibroids or other mass visualized. Endometrium Thickness: 8 mm.  No focal abnormality visualized. Right ovary Measurements: 4.9 x 4.5 x 4.5 cm. 3.3 x 3.9 x 3.7 cm hemorrhagic cyst. Left ovary Measurements: 2.7 x 1.4 x 2.7 cm. Normal appearance/no adnexal mass. Other findings Small pelvic ascites, likely physiologic. IMPRESSION: 3.9 cm right ovarian hemorrhagic cyst, likely physiologic. Clinically warranted, follow-up pelvic ultrasound can be performed in 6-12 weeks. Electronically Signed   By: Charline BillsSriyesh  Krishnan M.D.   On: 11/23/2015 16:03   I have personally reviewed and evaluated the lab results as part of my medical decision-making.   MDM  35 y.o. female with lower abdominal pain x 1 week stable for d/c with pain relieved with Toradol 60 mg IM. No acute abdomen. Will treat with Rx for Naprosyn and she will call the GYN Clinic for f/u for 6 weeks. She will return here sooner for any problems.    Final diagnoses:  Ovarian cyst, right

## 2015-11-24 LAB — GC/CHLAMYDIA PROBE AMP (~~LOC~~) NOT AT ARMC
CHLAMYDIA, DNA PROBE: NEGATIVE
Neisseria Gonorrhea: NEGATIVE

## 2015-11-24 LAB — HIV ANTIBODY (ROUTINE TESTING W REFLEX): HIV SCREEN 4TH GENERATION: NONREACTIVE

## 2016-01-04 ENCOUNTER — Ambulatory Visit: Payer: Medicaid Other | Admitting: Obstetrics and Gynecology

## 2016-01-25 ENCOUNTER — Ambulatory Visit (INDEPENDENT_AMBULATORY_CARE_PROVIDER_SITE_OTHER): Payer: Medicaid Other | Admitting: Obstetrics and Gynecology

## 2016-01-25 ENCOUNTER — Other Ambulatory Visit (HOSPITAL_COMMUNITY)
Admission: RE | Admit: 2016-01-25 | Discharge: 2016-01-25 | Disposition: A | Discharge status: Home / Self Care | Payer: Medicaid Other | Source: Ambulatory Visit | Attending: Obstetrics and Gynecology | Admitting: Obstetrics and Gynecology

## 2016-01-25 ENCOUNTER — Encounter: Payer: Self-pay | Admitting: Obstetrics and Gynecology

## 2016-01-25 VITALS — BP 108/71 | HR 66 | Resp 20

## 2016-01-25 DIAGNOSIS — N83209 Unspecified ovarian cyst, unspecified side: Secondary | ICD-10-CM | POA: Insufficient documentation

## 2016-01-25 DIAGNOSIS — N83201 Unspecified ovarian cyst, right side: Secondary | ICD-10-CM

## 2016-01-25 DIAGNOSIS — Z1151 Encounter for screening for human papillomavirus (HPV): Secondary | ICD-10-CM | POA: Insufficient documentation

## 2016-01-25 DIAGNOSIS — Z8742 Personal history of other diseases of the female genital tract: Secondary | ICD-10-CM | POA: Insufficient documentation

## 2016-01-25 DIAGNOSIS — Z01411 Encounter for gynecological examination (general) (routine) with abnormal findings: Secondary | ICD-10-CM | POA: Insufficient documentation

## 2016-01-25 NOTE — Progress Notes (Addendum)
Obstetrics and Gynecology Visit MAU follow up visit Evaluation  Appointment Date: 01/25/2016  Chief Complaint:  Chief Complaint  Patient presents with  . Follow-up    had MAU visit on 5/3    History of Present Illness: Julia Bautista is a 35 y.o. African-American [phone] (Patient's last menstrual period was 01/22/2016.), seen for the above chief complaint. Her past medical history is significant for h/o CIN 3, LEEP  Patient seen in MAU on 5/3 for RLQ pain and diagnosed with 3.9cm likely right hemorrhagic ovarian cyst with 6-12wk follow up advised. GC/CT, wet prep and UPT in early May 2017 were negative.   She has a qmonth, regular period that lasts for a few days and is heavy and painful in the 1st day or two. She is trying to get pregnant since March. She still occasionally has the pain and it worsens during her period.  No fevers, chills, dysuria, hematuria, change in BMs or blood in BMs  Review of Systems: as per HPI   Past Medical History:  Past Medical History  Diagnosis Date  . Asthma   . Abnormal Pap smear   . Other acne     ectopic pregnancy.     Past Surgical History:  Past Surgical History  Procedure Laterality Date  . Finger surgery    . Rotator cuff repair    . Cesarean section  2005  . Colposcopy  2013  . Leep  2013  . Ectopic pregnancy surgery    . Tooth extraction      Past Obstetrical History:  OB History    Gravida Para Term Preterm AB TAB SAB Ectopic Multiple Living   5 1 1  4 2 1 1  1       SVD x 1  Past Gynecological History: As per HPI.  Social History:  Social History   Social History  . Marital Status: Single    Spouse Name: N/A  . Number of Children: N/A  . Years of Education: N/A   Occupational History  . Not on file.   Social History Main Topics  . Smoking status: Current Every Day Smoker -- 0.00 packs/day for 12 years    Types: E-cigarettes  . Smokeless tobacco: Never Used     Comment: 1-800- ouit # given  . Alcohol Use:  0.0 oz/week     Comment: socially  . Drug Use: No  . Sexual Activity:    Partners: Male    Birth Control/ Protection: None   Other Topics Concern  . Not on file   Social History Narrative    Family History:  Family History  Problem Relation Age of Onset  . Diabetes Mother   . Hypertension Sister     Medications Ms. Jimmey Ralpharker does not currently have medications on file. Current Outpatient Prescriptions  Medication Sig Dispense Refill  . albuterol (PROVENTIL HFA;VENTOLIN HFA) 108 (90 Base) MCG/ACT inhaler Inhale 2 puffs into the lungs every 6 (six) hours as needed for wheezing or shortness of breath. Reported on 01/25/2016    . ferrous sulfate 325 (65 FE) MG tablet Take 1 tablet (325 mg total) by mouth daily. 30 tablet 0  . naproxen (NAPROSYN) 500 MG tablet Take 1 tablet (500 mg total) by mouth 2 (two) times daily. 30 tablet 0   No current facility-administered medications for this visit.    Allergies Darvon; Percocet; and Sulfa drugs cross reactors   Physical Exam:  BP 108/71 mmHg  Pulse 66  Resp 20  LMP  01/22/2016 There is no weight on file to calculate BMI. General appearance: Well nourished, well developed female in no acute distress.  Neck:  Supple, normal appearance, and no thyromegaly  Respiratory:   Normal respiratory effort Abdomen: positive bowel sounds and no masses, hernias; diffusely non tender to palpation, non distended Neuro/Psych:  Normal mood and affect.  Skin:  Warm and dry.  Lymphatic:  No inguinal lymphadenopathy.   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 Will repeat pap smear today and pt told likely will still be + and will need colpo  Pt told that cysts are normal with a cycle but usually aren't symptomatic but some patients are prone to recurrent cyst formation that are symptomatic. Since she is trying to get pregnant, that complicates because there's no real options available to prevent them from occuring in the future b/c it's part of a normal cycle. Instructions on timed intercourse given and will get an early cycle u/s sometime in the next few days to follow up the one in may  Orders Placed This Encounter  Procedures  . US Transvaginal Non-OB    RTC PRN  Cornelia Copaharlie Bryann Gentz, Jr MD Attending Center for [facility](Faculty Practice)

## 2016-01-27 LAB — CYTOLOGY - PAP

## 2016-01-31 ENCOUNTER — Ambulatory Visit (HOSPITAL_COMMUNITY): Payer: Medicaid Other

## 2016-02-06 ENCOUNTER — Ambulatory Visit (HOSPITAL_COMMUNITY)
Admission: RE | Admit: 2016-02-06 | Discharge: 2016-02-06 | Disposition: A | Discharge status: Home / Self Care | Payer: Medicaid Other | Source: Ambulatory Visit | Attending: Obstetrics and Gynecology | Admitting: Obstetrics and Gynecology

## 2016-02-06 DIAGNOSIS — N83201 Unspecified ovarian cyst, right side: Secondary | ICD-10-CM | POA: Insufficient documentation

## 2016-02-16 ENCOUNTER — Telehealth: Payer: Self-pay | Admitting: General Practice

## 2016-02-16 NOTE — Telephone Encounter (Signed)
Per Dr Vergie Living, patient's ultrasound was normal but patient needs colposcopy. Called patient and informed her of results & recommendations. Patient verbalized understanding & asked if it could still be done if she is pregnant. Told patient yes but there is a specific biopsy we wouldn't do if she was pregnant. Patient verbalized understanding. Told patient someone from the front office will call her to set that up. Patient verbalized understanding & had no questions

## 2016-03-11 ENCOUNTER — Encounter: Payer: Self-pay | Admitting: Family Medicine

## 2016-03-23 ENCOUNTER — Inpatient Hospital Stay (HOSPITAL_COMMUNITY)
Admission: AD | Admit: 2016-03-23 | Discharge: 2016-03-23 | Disposition: A | Discharge status: Home / Self Care | Payer: Medicaid Other | Source: Ambulatory Visit | Attending: Obstetrics & Gynecology | Admitting: Obstetrics & Gynecology

## 2016-03-23 ENCOUNTER — Inpatient Hospital Stay (HOSPITAL_COMMUNITY): Payer: Medicaid Other

## 2016-03-23 ENCOUNTER — Encounter (HOSPITAL_COMMUNITY): Payer: Self-pay

## 2016-03-23 DIAGNOSIS — R102 Pelvic and perineal pain: Secondary | ICD-10-CM | POA: Diagnosis not present

## 2016-03-23 DIAGNOSIS — J45909 Unspecified asthma, uncomplicated: Secondary | ICD-10-CM | POA: Insufficient documentation

## 2016-03-23 DIAGNOSIS — O99512 Diseases of the respiratory system complicating pregnancy, second trimester: Secondary | ICD-10-CM | POA: Diagnosis not present

## 2016-03-23 DIAGNOSIS — X58XXXA Exposure to other specified factors, initial encounter: Secondary | ICD-10-CM | POA: Insufficient documentation

## 2016-03-23 DIAGNOSIS — O4691 Antepartum hemorrhage, unspecified, first trimester: Secondary | ICD-10-CM | POA: Diagnosis not present

## 2016-03-23 DIAGNOSIS — O3680X0 Pregnancy with inconclusive fetal viability, not applicable or unspecified: Secondary | ICD-10-CM

## 2016-03-23 DIAGNOSIS — O26891 Other specified pregnancy related conditions, first trimester: Secondary | ICD-10-CM

## 2016-03-23 DIAGNOSIS — O26892 Other specified pregnancy related conditions, second trimester: Secondary | ICD-10-CM | POA: Diagnosis not present

## 2016-03-23 DIAGNOSIS — O209 Hemorrhage in early pregnancy, unspecified: Secondary | ICD-10-CM | POA: Insufficient documentation

## 2016-03-23 DIAGNOSIS — Z888 Allergy status to other drugs, medicaments and biological substances status: Secondary | ICD-10-CM | POA: Diagnosis not present

## 2016-03-23 DIAGNOSIS — Z9889 Other specified postprocedural states: Secondary | ICD-10-CM | POA: Insufficient documentation

## 2016-03-23 DIAGNOSIS — Z79899 Other long term (current) drug therapy: Secondary | ICD-10-CM | POA: Diagnosis not present

## 2016-03-23 LAB — WET PREP, GENITAL
SPERM: NONE SEEN
TRICH WET PREP: NONE SEEN
YEAST WET PREP: NONE SEEN

## 2016-03-23 LAB — URINALYSIS, ROUTINE W REFLEX MICROSCOPIC
Bilirubin Urine: NEGATIVE
Glucose, UA: NEGATIVE mg/dL
Ketones, ur: NEGATIVE mg/dL
LEUKOCYTES UA: NEGATIVE
NITRITE: NEGATIVE
PROTEIN: NEGATIVE mg/dL
SPECIFIC GRAVITY, URINE: 1.025 (ref 1.005–1.030)
pH: 6 (ref 5.0–8.0)

## 2016-03-23 LAB — CBC
HEMATOCRIT: 30.3 % — AB (ref 36.0–46.0)
HEMOGLOBIN: 10.3 g/dL — AB (ref 12.0–15.0)
MCH: 26.3 pg (ref 26.0–34.0)
MCHC: 34 g/dL (ref 30.0–36.0)
MCV: 77.3 fL — ABNORMAL LOW (ref 78.0–100.0)
Platelets: 204 10*3/uL (ref 150–400)
RBC: 3.92 MIL/uL (ref 3.87–5.11)
RDW: 13 % (ref 11.5–15.5)
WBC: 3.5 10*3/uL — AB (ref 4.0–10.5)

## 2016-03-23 LAB — HIV ANTIBODY (ROUTINE TESTING W REFLEX): HIV Screen 4th Generation wRfx: NONREACTIVE

## 2016-03-23 LAB — URINE MICROSCOPIC-ADD ON: Bacteria, UA: NONE SEEN

## 2016-03-23 LAB — POCT PREGNANCY, URINE: PREG TEST UR: POSITIVE — AB

## 2016-03-23 LAB — HCG, QUANTITATIVE, PREGNANCY: HCG, BETA CHAIN, QUANT, S: 43 m[IU]/mL — AB (ref <5)

## 2016-03-23 MED ORDER — HYDROMORPHONE HCL 1 MG/ML IJ SOLN
1.0000 mg | Freq: Once | INTRAMUSCULAR | Status: AC
  Administered 2016-03-23: 1 mg via INTRAMUSCULAR

## 2016-03-23 MED ORDER — HYDROMORPHONE HCL 1 MG/ML IJ SOLN
1.0000 mg | Freq: Once | INTRAMUSCULAR | Status: DC
  Filled 2016-03-23: qty 1

## 2016-03-23 NOTE — Discharge Instructions (Signed)

## 2016-03-23 NOTE — MAU Provider Note (Signed)
Chief Complaint: Abdominal Pain and Vaginal Bleeding   First Provider Initiated Contact with Patient 03/23/16 0849      SUBJECTIVE HPI: Julia Bautista is a 35 y.o. [id number] at [redacted]w[redacted]d by LMP who presents to maternity admissions reporting onset of RLQ abdominal pain yesterday, worsening today and onset of bright red vaginal bleeding today after arriving in MAU. She reports the pain had a sudden onset and is constant, burning and sharp, and worsening since onset. She has not taken anything for pain. It is not worsened or improved by movement/position change.  Her bleeding is constant and unchanged since onset.  It is not improved or worsened by anything. She denies vaginal itching/burning, urinary symptoms, h/a, dizziness, n/v, or fever/chills.     HPI  Past Medical History:  Diagnosis Date  . Abnormal Pap smear   . Asthma   . Other acne    ectopic pregnancy.    Past Surgical History:  Procedure Laterality Date  . CESAREAN SECTION  2005  . COLPOSCOPY  2013  . ECTOPIC PREGNANCY SURGERY    . FINGER SURGERY    . LEEP  2013  . ROTATOR CUFF REPAIR    . TOOTH EXTRACTION     Social History   Social History  . Marital status: Single    Spouse name: N/A  . Number of children: N/A  . Years of education: N/A   Occupational History  . Not on file.   Social History Main Topics  . Smoking status: Current Every Day Smoker    Packs/day: 0.00    Years: 12.00    Types: E-cigarettes  . Smokeless tobacco: Never Used     Comment: 1-800- ouit # given  . Alcohol use 0.0 oz/week     Comment: socially  . Drug use: No  . Sexual activity: Yes    Partners: Male    Birth control/ protection: None   Other Topics Concern  . Not on file   Social History Narrative  . No narrative on file   No current facility-administered medications on file prior to encounter.    Current Outpatient Prescriptions on File Prior to Encounter  Medication Sig Dispense Refill  . albuterol (PROVENTIL HFA;VENTOLIN  HFA) 108 (90 Base) MCG/ACT inhaler Inhale 2 puffs into the lungs every 6 (six) hours as needed for wheezing or shortness of breath. Reported on 01/25/2016    . naproxen (NAPROSYN) 500 MG tablet Take 1 tablet (500 mg total) by mouth 2 (two) times daily. (Patient not taking: Reported on 03/23/2016) 30 tablet 0   Allergies  Allergen Reactions  . Darvon [Propoxyphene] Itching  . Percocet [Oxycodone-Acetaminophen] Hives  . Sulfa Drugs Cross Reactors Hives    ROS:  Review of Systems  Constitutional: Negative for chills, fatigue and fever.  Respiratory: Negative for shortness of breath.   Cardiovascular: Negative for chest pain.  Genitourinary: Positive for pelvic pain and vaginal bleeding. Negative for difficulty urinating, dysuria, flank pain, vaginal discharge and vaginal pain.  Neurological: Negative for dizziness and headaches.  Psychiatric/Behavioral: Negative.      I have reviewed patient's Past Medical Hx, Surgical Hx, Family Hx, Social Hx, medications and allergies.   Physical Exam   Patient Vitals for the past 24 hrs:  BP Temp Pulse Resp Height Weight  03/23/16 0808 122/78 98.2 F (36.8 C) 84 18 - -  03/23/16 0804 - - - - 5\' 3"  (1.6 m) 156 lb (70.8 kg)   Constitutional: Well-developed, well-nourished female in no acute distress.  Cardiovascular: normal rate Respiratory: normal effort GI: Abd soft, mild tenderness in right lower pelvis/adnexal area, no rebound tenderness or guarding. Pos BS x 4 MS: Extremities nontender, no edema, normal ROM Neurologic: Alert and oriented x 4.  GU: Neg CVAT.  PELVIC EXAM: Cervix pink, visually closed, without lesion, small amount dark red bleeding, vaginal walls and external genitalia normal Bimanual exam: Cervix 0/long/high, firm, anterior, neg CMT, uterus nontender, nonenlarged, right adnexal tenderness, no  enlargement or mass bilaterally   LAB RESULTS Results for orders placed or performed during the hospital encounter of 03/23/16 (from  the past 24 hour(s))  Urinalysis, Routine w reflex microscopic (not at [facility])     Status: Abnormal   Collection Time: 03/23/16  8:00 AM  Result Value Ref Range   Color, Urine YELLOW YELLOW   APPearance CLEAR CLEAR   Specific Gravity, Urine 1.025 1.005 - 1.030   pH 6.0 5.0 - 8.0   Glucose, UA NEGATIVE NEGATIVE mg/dL   Hgb urine dipstick LARGE (A) NEGATIVE   Bilirubin Urine NEGATIVE NEGATIVE   Ketones, ur NEGATIVE NEGATIVE mg/dL   Protein, ur NEGATIVE NEGATIVE mg/dL   Nitrite NEGATIVE NEGATIVE   Leukocytes, UA NEGATIVE NEGATIVE  Urine microscopic-add on     Status: Abnormal   Collection Time: 03/23/16  8:00 AM  Result Value Ref Range   Squamous Epithelial / LPF 0-5 (A) NONE SEEN   WBC, UA 0-5 0 - 5 WBC/hpf   RBC / HPF 6-30 0 - 5 RBC/hpf   Bacteria, UA NONE SEEN NONE SEEN  Pregnancy, urine POC     Status: Abnormal   Collection Time: 03/23/16  8:05 AM  Result Value Ref Range   Preg Test, Ur POSITIVE (A) NEGATIVE  CBC     Status: Abnormal   Collection Time: 03/23/16  8:30 AM  Result Value Ref Range   WBC 3.5 (L) 4.0 - 10.5 K/uL   RBC 3.92 3.87 - 5.11 MIL/uL   Hemoglobin 10.3 (L) 12.0 - 15.0 g/dL   HCT 16.1 (L) 09.6 - 04.5 %   MCV 77.3 (L) 78.0 - 100.0 fL   MCH 26.3 26.0 - 34.0 pg   MCHC 34.0 30.0 - 36.0 g/dL   RDW 40.9 81.1 - 91.4 %   Platelets 204 150 - 400 K/uL  hCG, quantitative, pregnancy     Status: Abnormal   Collection Time: 03/23/16  8:30 AM  Result Value Ref Range   hCG, Beta Chain, Quant, S 43 (H) <5 mIU/mL  Wet prep, genital     Status: Abnormal   Collection Time: 03/23/16  8:40 AM  Result Value Ref Range   Yeast Wet Prep HPF POC NONE SEEN NONE SEEN   Trich, Wet Prep NONE SEEN NONE SEEN   Clue Cells Wet Prep HPF POC PRESENT (A) NONE SEEN   WBC, Wet Prep HPF POC FEW (A) NONE SEEN   Sperm NONE SEEN        IMAGING US Ob Comp Less 14 Wks  Result Date: 03/23/2016 CLINICAL DATA:  Right-sided pelvic pain and vaginal bleeding for 1 day. Gestational age by LMP of 4  weeks 4 days. EXAM: OBSTETRIC <14 WK [place] AND TRANSVAGINAL OB US TECHNIQUE: Both transabdominal and transvaginal ultrasound examinations were performed for complete evaluation of the gestation as well as the maternal uterus, adnexal regions, and pelvic cul-de-sac. Transvaginal technique was performed to assess early pregnancy. COMPARISON:  None. FINDINGS: No intrauterine gestational sac is visualized. Endometrial thickness measures 11 mm. No fibroids identified. Probable  small right ovarian corpus luteum noted, however no definite adnexal mass identified. No abnormal free-fluid. Left ovary not directly visualized, however no left adnexal mass identified. IMPRESSION: No intrauterine gestational sac or adnexal mass identified, consistent with pregnancy of unknown location. Differential diagnosis includes recent spontaneous abortion, IUP too early to visualize, and non-visualized ectopic pregnancy. Recommend close follow up of quantitative B-HCG levels, and follow up US as clinically warranted. Electronically Signed   By: Myles RosenthalJohn  Stahl M.D.   On: 03/23/2016 11:28   [place] Ob Transvaginal  Result Date: 03/23/2016 CLINICAL DATA:  Right-sided pelvic pain and vaginal bleeding for 1 day. Gestational age by LMP of 4 weeks 4 days. EXAM: OBSTETRIC <14 WK US AND TRANSVAGINAL OB US TECHNIQUE: Both transabdominal and transvaginal ultrasound examinations were performed for complete evaluation of the gestation as well as the maternal uterus, adnexal regions, and pelvic cul-de-sac. Transvaginal technique was performed to assess early pregnancy. COMPARISON:  None. FINDINGS: No intrauterine gestational sac is visualized. Endometrial thickness measures 11 mm. No fibroids identified. Probable small right ovarian corpus luteum noted, however no definite adnexal mass identified. No abnormal free-fluid. Left ovary not directly visualized, however no left adnexal mass identified. IMPRESSION: No intrauterine gestational sac or adnexal mass  identified, consistent with pregnancy of unknown location. Differential diagnosis includes recent spontaneous abortion, IUP too early to visualize, and non-visualized ectopic pregnancy. Recommend close follow up of quantitative B-HCG levels, and follow up US as clinically warranted. Electronically Signed   By: Myles RosenthalJohn  Stahl M.D.   On: 03/23/2016 11:28    MAU Management/MDM: Ordered labs and US and reviewed results.  Consult Dr Erin FullingHarraway-Smith to review images.  Findings today could represent a normal early pregnancy, spontaneous abortion or ectopic pregnancy which can be life-threatening.  Ectopic precautions were given to the patient with plan to return in 48 hours for repeat quant hcg to evaluate pregnancy development.  Treatments in MAU included Dilaudid 1 mg IM which significantly reduced pt pain. Pt stable at time of discharge.  ASSESSMENT 1. Pregnancy of unknown anatomic location   2. Pelvic pain affecting pregnancy in first trimester, antepartum   3. Vaginal bleeding in pregnancy, first trimester     PLAN Discharge home with ectopic and bleeding precautions Return to MAU on 9/3 for repeat hcg or sooner for emergencies    Medication List    STOP taking these medications   naproxen 500 MG tablet Commonly known as:  NAPROSYN     TAKE these medications   albuterol 108 (90 Base) MCG/ACT inhaler Commonly known as:  PROVENTIL HFA;VENTOLIN HFA Inhale 2 puffs into the lungs every 6 (six) hours as needed for wheezing or shortness of breath. Reported on 01/25/2016   prenatal multivitamin Tabs tablet Take 1 tablet by mouth daily at 12 noon.      Follow-up Information    [facility] .   Why:  Return to MAU on Sunday, 03/25/16, for repeat labwork. Return sooner as needed for emergencies. Contact information: [address] [place] [phone]          Sharen CounterLisa Leftwich-Kirby Certified Nurse-Midwife 03/23/2016  12:25 PM

## 2016-03-23 NOTE — MAU Note (Signed)
Onset of right lower abdominal pain since last night, just started bleeding.

## 2016-03-25 ENCOUNTER — Inpatient Hospital Stay (HOSPITAL_COMMUNITY)
Admission: AD | Admit: 2016-03-25 | Discharge: 2016-03-25 | Disposition: A | Discharge status: Home / Self Care | Payer: Medicaid Other | Source: Ambulatory Visit | Attending: Obstetrics & Gynecology | Admitting: Obstetrics & Gynecology

## 2016-03-25 DIAGNOSIS — O0281 Inappropriate change in quantitative human chorionic gonadotropin (hCG) in early pregnancy: Secondary | ICD-10-CM | POA: Diagnosis not present

## 2016-03-25 DIAGNOSIS — Z3A01 Less than 8 weeks gestation of pregnancy: Secondary | ICD-10-CM | POA: Diagnosis not present

## 2016-03-25 DIAGNOSIS — O209 Hemorrhage in early pregnancy, unspecified: Secondary | ICD-10-CM | POA: Insufficient documentation

## 2016-03-25 DIAGNOSIS — O3680X0 Pregnancy with inconclusive fetal viability, not applicable or unspecified: Secondary | ICD-10-CM

## 2016-03-25 LAB — HCG, QUANTITATIVE, PREGNANCY: HCG, BETA CHAIN, QUANT, S: 21 m[IU]/mL — AB (ref <5)

## 2016-03-25 NOTE — MAU Note (Signed)
Here for follow up blood work.  Stat is still having pain in RLQ,mild- comes and goes.  Bleeding has slowed down.

## 2016-03-25 NOTE — MAU Provider Note (Signed)
History   Chief Complaint:  Follow-up   Julia Bautista is  35 y.o. [id number] Patient's last menstrual period was 02/20/2016.Marland Kitchen Patient is here for follow up of quantitative HCG and ongoing surveillance of pregnancy status.   She is [redacted]w[redacted]d weeks gestation  by LMP.    Since her last visit, the patient is without new complaint.   The patient reports bleeding as  similar to period had has passed several clots.   General ROS:  negative for abdominal pain  She continues to have some vaginal bleeding but otherwise has no complaints.  Her previous Quantitative HCG values are: 43 on 03-23-16     Physical Exam   Blood pressure 132/84, pulse 74, temperature 98 F (36.7 C), temperature source Oral, resp. rate 16, last menstrual period 02/20/2016. GENERAL: Well-developed, well-nourished female in no acute distress.  HEENT: Normocephalic, atraumatic.  LUNGS: Effort normal  HEART: Regular rate  SKIN: Warm, dry and without erythema  PSYCH: Normal mood and affect   Labs: Results for orders placed or performed during the hospital encounter of 03/25/16 (from the past 24 hour(s))  hCG, quantitative, pregnancy   Collection Time: 03/25/16 11:22 AM  Result Value Ref Range   hCG, Beta Chain, Quant, S 21 (H) <5 mIU/mL    Ultrasound Studies:   US Ob Comp Less 14 Wks  Result Date: 03/23/2016 CLINICAL DATA:  Right-sided pelvic pain and vaginal bleeding for 1 day. Gestational age by LMP of 4 weeks 4 days. EXAM: OBSTETRIC <14 WK [place] AND TRANSVAGINAL OB US TECHNIQUE: Both transabdominal and transvaginal ultrasound examinations were performed for complete evaluation of the gestation as well as the maternal uterus, adnexal regions, and pelvic cul-de-sac. Transvaginal technique was performed to assess early pregnancy. COMPARISON:  None. FINDINGS: No intrauterine gestational sac is visualized. Endometrial thickness measures 11 mm. No fibroids identified. Probable small right ovarian corpus luteum noted, however no  definite adnexal mass identified. No abnormal free-fluid. Left ovary not directly visualized, however no left adnexal mass identified. IMPRESSION: No intrauterine gestational sac or adnexal mass identified, consistent with pregnancy of unknown location. Differential diagnosis includes recent spontaneous abortion, IUP too early to visualize, and non-visualized ectopic pregnancy. Recommend close follow up of quantitative B-HCG levels, and follow up US as clinically warranted. Electronically Signed   By: Myles Rosenthal M.D.   On: 03/23/2016 11:28   US Ob Transvaginal  Result Date: 03/23/2016 CLINICAL DATA:  Right-sided pelvic pain and vaginal bleeding for 1 day. Gestational age by LMP of 4 weeks 4 days. EXAM: OBSTETRIC <14 WK [place] AND TRANSVAGINAL OB US TECHNIQUE: Both transabdominal and transvaginal ultrasound examinations were performed for complete evaluation of the gestation as well as the maternal uterus, adnexal regions, and pelvic cul-de-sac. Transvaginal technique was performed to assess early pregnancy. COMPARISON:  None. FINDINGS: No intrauterine gestational sac is visualized. Endometrial thickness measures 11 mm. No fibroids identified. Probable small right ovarian corpus luteum noted, however no definite adnexal mass identified. No abnormal free-fluid. Left ovary not directly visualized, however no left adnexal mass identified. IMPRESSION: No intrauterine gestational sac or adnexal mass identified, consistent with pregnancy of unknown location. Differential diagnosis includes recent spontaneous abortion, IUP too early to visualize, and non-visualized ectopic pregnancy. Recommend close follow up of quantitative B-HCG levels, and follow up US as clinically warranted. Electronically Signed   By: Myles Rosenthal M.D.   On: 03/23/2016 11:28    Assessment:  [redacted]w[redacted]d weeks gestation , Pregnancy of unknown location with vaginal bleeding and falling quant level,  possible SAB   Plan: The patient is instructed to follow  up on Tuesday at 11 am in the clinic on the ground floor for repeat quant. Client is tearful and agrees to the plan of care.  Caelan Atchley L Shonika Kolasinski 03/25/2016, 1:27 PM

## 2016-03-27 ENCOUNTER — Other Ambulatory Visit: Payer: Medicaid Other

## 2016-03-27 ENCOUNTER — Other Ambulatory Visit: Payer: Self-pay | Admitting: Advanced Practice Midwife

## 2016-03-27 DIAGNOSIS — O3680X0 Pregnancy with inconclusive fetal viability, not applicable or unspecified: Secondary | ICD-10-CM

## 2016-03-27 DIAGNOSIS — O039 Complete or unspecified spontaneous abortion without complication: Secondary | ICD-10-CM | POA: Insufficient documentation

## 2016-03-27 LAB — GC/CHLAMYDIA PROBE AMP (~~LOC~~) NOT AT ARMC
CHLAMYDIA, DNA PROBE: NEGATIVE
NEISSERIA GONORRHEA: NEGATIVE

## 2016-03-27 LAB — HCG, QUANTITATIVE, PREGNANCY: hCG, Beta Chain, Quant, S: 8 m[IU]/mL — ABNORMAL HIGH (ref <5)

## 2016-03-27 MED ORDER — TRAMADOL HCL 50 MG PO TABS: 50.0000 mg | ORAL_TABLET | Freq: Four times a day (QID) | ORAL | 0 refills | Status: DC | PRN

## 2016-03-27 MED ORDER — IBUPROFEN 600 MG PO TABS: 600.0000 mg | ORAL_TABLET | Freq: Four times a day (QID) | ORAL | 1 refills | Status: DC | PRN

## 2016-03-27 NOTE — Progress Notes (Signed)
Ms. Julia DowdySophia L Bautista  is a 35 y.o. [id number] at 5750w1d who [facility] today for follow-up quant hCG after 48 hours.   The patient was seen in MAU on 03/23/16  quant hCG of 43  US showed nothing in uterus.   Repeat HCG was 21 on 03/25/16   She Endorses moderately severe cramping pain Endorses vaginal bleeding  Denies fever    OB History  Gravida Para Term Preterm AB Living  6 1 1   4 1   SAB TAB Ectopic Multiple Live Births  1 2 1         # Outcome Date GA Lbr Len/2nd Weight Sex Delivery Anes PTL Lv  6 Current           5 Ectopic           4 SAB           3 Term           2 TAB           1 TAB               Past Medical History:  Diagnosis Date  . Abnormal Pap smear   . Asthma   . Other acne    ectopic pregnancy.      LMP 02/20/2016   CONSTITUTIONAL: Well-developed, well-nourished female in no acute distress.  MUSCULOSKELETAL: Normal range of motion.  CARDIOVASCULAR: Regular heart rate RESPIRATORY: Normal effort NEUROLOGICAL: Alert and oriented to person, place, and time.  SKIN: Skin is warm and dry. No rash noted. Not diaphoretic. No erythema. No pallor. PSYCH: Normal mood and affect. Normal behavior. Normal judgment and thought content.  Results for orders placed or performed in visit on 03/27/16 (from the past 24 hour(s))  hCG, quantitative, pregnancy     Status: Abnormal   Collection Time: 03/27/16 11:02 AM  Result Value Ref Range   hCG, Beta Chain, Quant, S 8 (H) <5 mIU/mL  03/23/16 = 43 03/25/16 = 21  A:  Drop in quant hCG after 48 hours  P: Discharge home First trimester/ectopic precautions discussed Patient will return for follow-up HCG 1 week.  Rx Ibuprofen and Tramadol given. Warned about possible cross  Reaction since she gets hives with Oxycodone (tolerated Dilaudid in MAU) Patient may return to MAU as needed or if her condition were to change or worsen   Aviva SignsMarie L Diamon Reddinger, CNM 03/27/2016 1:06 PM

## 2016-03-27 NOTE — Progress Notes (Signed)
Patient here for stat bhcg today. Patient reports pain increasing since last night, currently between 8 -9. Patient states bleeding started back this morning but only when she wipes. Per Julia Bautista Patient results show she is having a miscarriage. She has been given tramadol for pain and will follow up in 1 week to make sure her levels are back to normal. Patient verbalizes understanding at this time.

## 2016-04-03 ENCOUNTER — Other Ambulatory Visit: Payer: Medicaid Other

## 2016-04-03 DIAGNOSIS — O3680X0 Pregnancy with inconclusive fetal viability, not applicable or unspecified: Secondary | ICD-10-CM

## 2016-04-03 NOTE — Progress Notes (Signed)
Patient presented to office today for a repeat beta. Patient stated she is feeling well at this time. I advised patient we will call her with test results.

## 2016-04-04 LAB — HCG, QUANTITATIVE, PREGNANCY

## 2016-04-09 ENCOUNTER — Emergency Department (HOSPITAL_COMMUNITY)
Admission: EM | Admit: 2016-04-09 | Discharge: 2016-04-09 | Disposition: A | Discharge status: Home / Self Care | Payer: Medicaid Other | Attending: Emergency Medicine | Admitting: Emergency Medicine

## 2016-04-09 ENCOUNTER — Encounter (HOSPITAL_COMMUNITY): Payer: Self-pay | Admitting: Emergency Medicine

## 2016-04-09 ENCOUNTER — Emergency Department (HOSPITAL_COMMUNITY): Payer: Medicaid Other

## 2016-04-09 DIAGNOSIS — Z79899 Other long term (current) drug therapy: Secondary | ICD-10-CM | POA: Insufficient documentation

## 2016-04-09 DIAGNOSIS — J45909 Unspecified asthma, uncomplicated: Secondary | ICD-10-CM | POA: Diagnosis not present

## 2016-04-09 DIAGNOSIS — Z791 Long term (current) use of non-steroidal anti-inflammatories (NSAID): Secondary | ICD-10-CM | POA: Diagnosis not present

## 2016-04-09 DIAGNOSIS — J40 Bronchitis, not specified as acute or chronic: Secondary | ICD-10-CM

## 2016-04-09 DIAGNOSIS — F17210 Nicotine dependence, cigarettes, uncomplicated: Secondary | ICD-10-CM | POA: Insufficient documentation

## 2016-04-09 DIAGNOSIS — R0981 Nasal congestion: Secondary | ICD-10-CM | POA: Diagnosis present

## 2016-04-09 DIAGNOSIS — J069 Acute upper respiratory infection, unspecified: Secondary | ICD-10-CM | POA: Insufficient documentation

## 2016-04-09 DIAGNOSIS — J209 Acute bronchitis, unspecified: Secondary | ICD-10-CM | POA: Diagnosis not present

## 2016-04-09 MED ORDER — PREDNISONE 20 MG PO TABS: 60.0000 mg | ORAL_TABLET | Freq: Every day | ORAL | 0 refills | Status: AC

## 2016-04-09 MED ORDER — PREDNISONE 20 MG PO TABS: 20.0000 mg | ORAL_TABLET | Freq: Every day | ORAL | 0 refills | Status: DC

## 2016-04-09 MED ORDER — BENZONATATE 100 MG PO CAPS: 100.0000 mg | ORAL_CAPSULE | Freq: Three times a day (TID) | ORAL | 0 refills | Status: DC | PRN

## 2016-04-09 MED ORDER — AZITHROMYCIN 250 MG PO TABS: 250.0000 mg | ORAL_TABLET | Freq: Every day | ORAL | 0 refills | Status: DC

## 2016-04-09 MED ORDER — ALBUTEROL SULFATE HFA 108 (90 BASE) MCG/ACT IN AERS
6.0000 | INHALATION_SPRAY | Freq: Once | RESPIRATORY_TRACT | Status: AC
  Administered 2016-04-09: 6 via RESPIRATORY_TRACT
  Filled 2016-04-09: qty 6.7

## 2016-04-09 MED ORDER — PREDNISONE 20 MG PO TABS
60.0000 mg | ORAL_TABLET | Freq: Once | ORAL | Status: AC
  Administered 2016-04-09: 60 mg via ORAL
  Filled 2016-04-09: qty 3

## 2016-04-09 NOTE — ED Triage Notes (Signed)
Pt c/o generalized body aches, productive cough - unable to describe color - and nasal congestion since Friday. Pt denies fevers, N/V/D. Denies chest pain, SOB. C/o difficulty breathing when nose is stopped up. A&Ox4 and ambulatory.

## 2016-04-09 NOTE — ED Notes (Signed)
Pt given ginger ale and graham crackers.

## 2016-04-09 NOTE — ED Provider Notes (Signed)
[facility]-EMERGENCY DEPT Provider Note   CSN: [id number] Arrival date & time: 04/09/16  4098     History   Chief Complaint Chief Complaint  Patient presents with  . Generalized Body Aches  . Cough  . Nasal Congestion    HPI Julia Bautista is a 35 y.o. female.  The history is provided by the patient and medical records.  URI   This is a new problem. The current episode started more than 2 days ago. The problem has not changed since onset.There has been no fever. The fever has been present for less than 1 day. Associated symptoms include congestion, rhinorrhea, sneezing, cough and wheezing. Pertinent negatives include no chest pain, no abdominal pain, no diarrhea, no nausea, no vomiting, no dysuria, no headaches, no sore throat, no neck pain and no rash. She has tried increased fluids and other medications for the symptoms. The treatment provided no relief.    Past Medical History:  Diagnosis Date  . Abnormal Pap smear   . Asthma   . Other acne    ectopic pregnancy.     Patient Active Problem List   Diagnosis Date Noted  . SAB (spontaneous abortion) 03/27/2016  . Hemorrhagic cyst of ovary 01/25/2016  . History of abnormal cervical Pap smear 01/25/2016    Past Surgical History:  Procedure Laterality Date  . CESAREAN SECTION  2005  . COLPOSCOPY  2013  . ECTOPIC PREGNANCY SURGERY    . FINGER SURGERY    . LEEP  2013  . ROTATOR CUFF REPAIR    . TOOTH EXTRACTION      OB History    Gravida Para Term Preterm AB Living   6 1 1   4 1    SAB TAB Ectopic Multiple Live Births   1 2 1            Home Medications    Prior to Admission medications   Medication Sig Start Date End Date Taking? Authorizing Provider  albuterol (PROVENTIL HFA;VENTOLIN HFA) 108 (90 Base) MCG/ACT inhaler Inhale 2 puffs into the lungs every 6 (six) hours as needed for wheezing or shortness of breath. Reported on 01/25/2016   Yes Historical Provider, MD  Prenatal Vit-Fe Fumarate-FA (PRENATAL  MULTIVITAMIN) TABS tablet Take 1 tablet by mouth daily at 12 noon.   Yes Historical Provider, MD  valACYclovir (VALTREX) 500 MG tablet Take 1,000 mg by mouth 2 (two) times daily. 04/02/16  Yes Historical Provider, MD  azithromycin (ZITHROMAX) 250 MG tablet Take 1 tablet (250 mg total) by mouth daily. Take first 2 tablets together, then 1 every day until finished. 04/09/16   Shaune Pollack, MD  benzonatate (TESSALON) 100 MG capsule Take 1 capsule (100 mg total) by mouth 3 (three) times daily as needed for cough. 04/09/16   Shaune Pollack, MD  ibuprofen (ADVIL,MOTRIN) 600 MG tablet Take 1 tablet (600 mg total) by mouth every 6 (six) hours as needed. Patient taking differently: Take 600 mg by mouth every 6 (six) hours as needed (Pain).  03/27/16   Aviva Signs, CNM  metroNIDAZOLE (METROGEL) 0.75 % vaginal gel Place 1 application vaginally 2 (two) times a week. 03/27/16   Historical Provider, MD  predniSONE (DELTASONE) 20 MG tablet Take 3 tablets (60 mg total) by mouth daily. 04/09/16 04/14/16  Shaune Pollack, MD  traMADol (ULTRAM) 50 MG tablet Take 1 tablet (50 mg total) by mouth every 6 (six) hours as needed for moderate pain. 03/27/16   Aviva Signs, CNM    Family  History Family History  Problem Relation Age of Onset  . Diabetes Mother   . Hypertension Sister     Social History Social History  Substance Use Topics  . Smoking status: Current Every Day Smoker    Packs/day: 0.00    Years: 12.00    Types: E-cigarettes  . Smokeless tobacco: Never Used     Comment: 1-800- ouit # given  . Alcohol use 0.0 oz/week     Comment: socially     Allergies   Darvon [propoxyphene]; Percocet [oxycodone-acetaminophen]; and Sulfa drugs cross reactors   Review of Systems Review of Systems  Constitutional: Positive for fatigue. Negative for chills and fever.  HENT: Positive for congestion, rhinorrhea and sneezing. Negative for sore throat.   Eyes: Negative for visual disturbance.  Respiratory:  Positive for cough and wheezing. Negative for shortness of breath.   Cardiovascular: Negative for chest pain and leg swelling.  Gastrointestinal: Negative for abdominal pain, diarrhea, nausea and vomiting.  Genitourinary: Negative for dysuria, flank pain, vaginal bleeding and vaginal discharge.  Musculoskeletal: Negative for neck pain.  Skin: Negative for rash.  Allergic/Immunologic: Negative for immunocompromised state.  Neurological: Negative for syncope and headaches.  Hematological: Does not bruise/bleed easily.  All other systems reviewed and are negative.    Physical Exam Updated Vital Signs BP 110/78 (BP Location: Right Arm)   Pulse 96   Temp 98.2 F (36.8 C) (Oral)   Resp 17   LMP  (LMP Unknown)   SpO2 100%   Breastfeeding? Unknown Comment: miscarriage on august 15th  Physical Exam  Constitutional: She is oriented to person, place, and time. She appears well-developed and well-nourished. No distress.  HENT:  Head: Normocephalic and atraumatic.  Right Ear: Tympanic membrane normal.  Left Ear: Tympanic membrane normal.  Nose: Mucosal edema and rhinorrhea present.  Mouth/Throat: Mucous membranes are normal. Posterior oropharyngeal erythema present. No posterior oropharyngeal edema. Tonsils are 1+ on the right. Tonsils are 1+ on the left. No tonsillar exudate.  Eyes: Conjunctivae are normal.  Neck: Neck supple.  Cardiovascular: Normal rate, regular rhythm and normal heart sounds.  Exam reveals no friction rub.   No murmur heard. Pulmonary/Chest: Effort normal. No accessory muscle usage. No tachypnea. No respiratory distress. She has no decreased breath sounds. She has wheezes (mild, scattered, end-expiratory only). She has no rhonchi. She has no rales.  Abdominal: She exhibits no distension.  Musculoskeletal: She exhibits no edema.  Neurological: She is alert and oriented to person, place, and time. She exhibits normal muscle tone.  Skin: Skin is warm. Capillary refill  takes less than 2 seconds.  Psychiatric: She has a normal mood and affect.  Nursing note and vitals reviewed.    ED Treatments / Results  Labs (all labs ordered are listed, but only abnormal results are displayed) Labs Reviewed - No data to display  EKG  EKG Interpretation None       Radiology Dg Chest 2 View  Result Date: 04/09/2016 CLINICAL DATA:  Diffuse body aches, productive cough, nasal congestion, some shortness of breath, history of smoking EXAM: CHEST  2 VIEW COMPARISON:  Chest x-ray of 08/05/2006 FINDINGS: No parenchymal infiltrate or pleural effusion is seen. There is some peribronchial thickening skin December bronchitis. Mediastinal and hilar contours are unremarkable. The heart is within normal limits in size. No bony abnormality is seen. IMPRESSION: No pneumonia or effusion.  Cannot exclude bronchitis. Electronically Signed   By: Dwyane Dee M.D.   On: 04/09/2016 10:56    Procedures Procedures (  including critical care time)  Medications Ordered in ED Medications  albuterol (PROVENTIL HFA;VENTOLIN HFA) 108 (90 Base) MCG/ACT inhaler 6 puff (6 puffs Inhalation Given 04/09/16 1151)  predniSONE (DELTASONE) tablet 60 mg (60 mg Oral Given 04/09/16 1150)     Initial Impression / Assessment and Plan / ED Course  I have reviewed the triage vital signs and the nursing notes.  Pertinent labs & imaging results that were available during my care of the patient were reviewed by me and considered in my medical decision making (see chart for details).  Clinical Course   35 yo F with remote h/o asthma who p/w cough, congestion, rhinorrhea, and wheezing. Known sick contacts. On arrival, VSS and WNL. Pt is overall well appearing with normal WOB. Suspect URI/bronchitis with RAD component. She did recently undergo ectopic pregnancy surgery but no LE edema, no tachycardia, no hypoxia or signs of PE. Will treat with steroids, albuterol, Z-pack for sputum production, ? History of asthma,  and good return precautions. Encouraged fluids. WOB o/w normal, no hypoxia, no signs of significant resp distress.   Final Clinical Impressions(s) / ED Diagnoses   Final diagnoses:  URI (upper respiratory infection)  Bronchitis    New Prescriptions Discharge Medication List as of 04/09/2016 12:23 PM       Shaune Pollackameron Breah Joa, MD 04/10/16 [id number]

## 2016-05-03 ENCOUNTER — Other Ambulatory Visit (HOSPITAL_COMMUNITY)
Admission: RE | Admit: 2016-05-03 | Discharge: 2016-05-03 | Disposition: A | Discharge status: Home / Self Care | Payer: Medicaid Other | Source: Ambulatory Visit | Attending: Family Medicine | Admitting: Family Medicine

## 2016-05-03 ENCOUNTER — Ambulatory Visit (INDEPENDENT_AMBULATORY_CARE_PROVIDER_SITE_OTHER): Payer: Medicaid Other | Admitting: Family Medicine

## 2016-05-03 ENCOUNTER — Encounter: Payer: Self-pay | Admitting: Family Medicine

## 2016-05-03 VITALS — BP 113/90 | HR 68 | Wt 153.8 lb

## 2016-05-03 DIAGNOSIS — Z87898 Personal history of other specified conditions: Secondary | ICD-10-CM | POA: Diagnosis not present

## 2016-05-03 DIAGNOSIS — R87810 Cervical high risk human papillomavirus (HPV) DNA test positive: Secondary | ICD-10-CM | POA: Insufficient documentation

## 2016-05-03 DIAGNOSIS — B977 Papillomavirus as the cause of diseases classified elsewhere: Secondary | ICD-10-CM

## 2016-05-03 LAB — POCT PREGNANCY, URINE: PREG TEST UR: NEGATIVE

## 2016-05-03 NOTE — Patient Instructions (Signed)

## 2016-05-03 NOTE — Progress Notes (Signed)
GYNECOLOGY CLINIC COLPOSCOPY VISIT AND PROCEDURE NOTE  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 The patient reports the following prior treatments to the vulva/vagina/cervix: LEEP 2013. The patient is a cigarette smoker. The patient is not immunosuppressed. The patient is not pregnant. The patient is not taking anticoagulants and has no allergy to iodine.  Patient given informed consent, signed copy in the chart, time out was performed. Urine pregnancy test performed and confirmed to be negative.  Placed in lithotomy position.  Gross findings:   Vagina: Normal mucosa  Vulva: Normal external genitalia  Cervix: 11 o'clock abnormal vessels on cervix  Visualization after:  Acetic acid: 11 o'clock with vessels noted; 3 o'clock punctation  Upper one-third of vagina examined, revealing: Normal mucosa  Biopsies obtained: 3 o'clock and 11 o'clock  ECC specimen obtained: YES  Colposcopy adequate? Yes  All specimens were labelled and sent to pathology.   Patient was given post procedure instructions.  Will follow up pathology and manage accordingly.      Jen MowElizabeth Kalina Morabito, DO OB/GYN Fellow

## 2016-06-11 ENCOUNTER — Encounter: Payer: Self-pay | Admitting: Obstetrics and Gynecology

## 2016-06-11 ENCOUNTER — Ambulatory Visit (INDEPENDENT_AMBULATORY_CARE_PROVIDER_SITE_OTHER): Payer: Medicaid Other | Admitting: Obstetrics and Gynecology

## 2016-06-11 ENCOUNTER — Other Ambulatory Visit (HOSPITAL_COMMUNITY)
Admission: RE | Admit: 2016-06-11 | Discharge: 2016-06-11 | Disposition: A | Discharge status: Home / Self Care | Payer: Medicaid Other | Source: Ambulatory Visit | Attending: Obstetrics and Gynecology | Admitting: Obstetrics and Gynecology

## 2016-06-11 VITALS — BP 109/68 | HR 76 | Wt 159.6 lb

## 2016-06-11 DIAGNOSIS — Z98891 History of uterine scar from previous surgery: Secondary | ICD-10-CM | POA: Insufficient documentation

## 2016-06-11 DIAGNOSIS — Z3490 Encounter for supervision of normal pregnancy, unspecified, unspecified trimester: Secondary | ICD-10-CM | POA: Insufficient documentation

## 2016-06-11 DIAGNOSIS — B009 Herpesviral infection, unspecified: Secondary | ICD-10-CM | POA: Insufficient documentation

## 2016-06-11 DIAGNOSIS — O09293 Supervision of pregnancy with other poor reproductive or obstetric history, third trimester: Secondary | ICD-10-CM

## 2016-06-11 DIAGNOSIS — Z8759 Personal history of other complications of pregnancy, childbirth and the puerperium: Secondary | ICD-10-CM | POA: Insufficient documentation

## 2016-06-11 DIAGNOSIS — Z113 Encounter for screening for infections with a predominantly sexual mode of transmission: Secondary | ICD-10-CM | POA: Insufficient documentation

## 2016-06-11 DIAGNOSIS — Z9889 Other specified postprocedural states: Secondary | ICD-10-CM

## 2016-06-11 DIAGNOSIS — Z8742 Personal history of other diseases of the female genital tract: Secondary | ICD-10-CM

## 2016-06-11 LAB — POCT URINALYSIS DIP (DEVICE)
BILIRUBIN URINE: NEGATIVE
Glucose, UA: NEGATIVE mg/dL
Hgb urine dipstick: NEGATIVE
Ketones, ur: NEGATIVE mg/dL
NITRITE: NEGATIVE
PH: 7.5 (ref 5.0–8.0)
Protein, ur: NEGATIVE mg/dL
Specific Gravity, Urine: 1.015 (ref 1.005–1.030)
UROBILINOGEN UA: 1 mg/dL (ref 0.0–1.0)

## 2016-06-11 LAB — OB RESULTS CONSOLE GC/CHLAMYDIA: Gonorrhea: NEGATIVE

## 2016-06-11 LAB — POCT PREGNANCY, URINE: PREG TEST UR: POSITIVE — AB

## 2016-06-11 NOTE — Progress Notes (Signed)
Here for first prenatal visit. Had miscarriage 03/23/16 , then LMP 04/21/16.  States has had spotting size of nickle twice a week. Declines flu shot.

## 2016-06-11 NOTE — Progress Notes (Signed)
New OB Note  06/11/2016   [facility]  Chief Complaint: +home UPT  Transfer of Care Patient: no  History of Present Illness: Julia Bautista is a 35 y.o. [id number] @ 7/2 weeks (EDC 7/7, based on Patient's last menstrual period was 04/21/2016 (exact date).), with the above CC. Patient had negative UPT on 10/12 colpo visit but she uses a phone app and is sure of her LMP. Preg complicated by has History of abnormal cervical Pap smear; Supervision of normal pregnancy; History of miscarriage; history of Herpes; and History of loop electrical excision procedure (LEEP) on her problem list.   Her periods were: regular, qmonth She was using no method when she conceived.  She has mild signs or symptoms of nausea/vomiting of pregnancy; slight nausea w/o vomiting She has Positive signs or symptoms of miscarriage or preterm labor; she had some spotting yesterday On any different medications around the time she conceived/early pregnancy: No   ROS: A 12-point review of systems was performed and negative, except as stated in the above HPI.  OBGYN History: As per HPI. OB History  Gravida Para Term Preterm AB Living  7 1 1   5 1   SAB TAB Ectopic Multiple Live Births  2 2 1   1     # Outcome Date GA Lbr Len/2nd Weight Sex Delivery Anes PTL Lv  7 Current           6 SAB 03/23/16 5163w4d         5 Ectopic 2007          4 Term 2005   6 lb 9 oz (2.977 kg) F CS-Unspec EPI  LIV     [facility], no other complications  3 SAB           2 TAB           1 TAB             Obstetric Comments  EAB x 3 (all D&Cs), 2015 pLTCS @ 6cm per patient, SAB x 2    Any issues with any prior pregnancies: no Any prior children are healthy, doing well, without any problems or issues: yes History of pap smears: Yes.  History of STIs: No   Past Medical History: Past Medical History:  Diagnosis Date  . Abnormal Pap smear   . Asthma   . Herpes   . Other acne    ectopic  pregnancy.     Past Surgical History: Past Surgical History:  Procedure Laterality Date  . CESAREAN SECTION  2005  . COLPOSCOPY  2013  . FINGER SURGERY    . LEEP  2013  . ROTATOR CUFF REPAIR    . TOOTH EXTRACTION    . UNILATERAL SALPINGECTOMY Right 2008   via mini-lap    Family History:  Family History  Problem Relation Age of Onset  . Diabetes Mother   . Hypertension Sister    She denies any female cancers, bleeding or blood clotting disorders.  She denies any history of mental retardation, birth defects or genetic disorders in her or the FOB's history  Social History:  Social History   Social History  . Marital status: Single    Spouse name: N/A  . Number of children: N/A  . Years of education: N/A   Occupational History  . Not on file.   Social History Main Topics  . Smoking status: Former Smoker    Packs/day: 0.00  Years: 12.00    Types: E-cigarettes    Quit date: 06/04/2016  . Smokeless tobacco: Never Used     Comment: 1-800- ouit # given  . Alcohol use 0.0 oz/week     Comment: socially before pregancy  . Drug use: No  . Sexual activity: Yes    Partners: Male    Birth control/ protection: None   Other Topics Concern  . Not on file   Social History Narrative  . No narrative on file    Allergy: Allergies  Allergen Reactions  . Darvon [Propoxyphene] Itching  . Percocet [Oxycodone-Acetaminophen] Hives  . Sulfa Drugs Cross Reactors Hives    Health Maintenance:  Mammogram Up to Date: not applicable  Current Outpatient Medications: PNV  Physical Exam: BP 109/68   Pulse 76   Wt 159 lb 9.6 oz (72.4 kg)   LMP 04/21/2016 (Exact Date)   BMI 28.27 kg/m  Body mass index is 28.27 kg/m.  General appearance: Well nourished, well developed female in no acute distress.  Neck:  Supple, normal appearance, and no thyromegaly  Cardiovascular: S1, S2 normal, no murmur, rub or gallop, regular rate and rhythm Respiratory:  Clear to auscultation  bilateral. Normal respiratory effort Abdomen: positive bowel sounds and no masses, hernias; diffusely non tender to palpation, non distended Neuro/Psych:  Normal mood and affect.  Skin:  Warm and dry.   Laboratory: +UPT  Imaging:  none  Assessment: pt stable  Plan: 1. Encounter for supervision of normal pregnancy, antepartum, unspecified gravidity Routine care. Will get a beta hcg today and then call patient to schedule viability scan. NOB labs and breast exam not done given her risk of ectopic and SAB and tentatively see her back in 3wks for ROB and she can have routine labs and breast exam then  - GC/Chlamydia probe amp ([place])[facility] at [facility] - B-HCG Quant - Urine Culture  2. history of Herpes ppx at 35-36wks  3. History of miscarriage, ectopic See above. ER precautions given  4. History of abnormal cervical Pap smear colpo done last month and just HPV changes on bx with negative ECC. Repeat pap/hpv testing in one year  5. H/o LEEP Screening CL at 14-16wks  6. H/o c-section D/w pt later in pregnancy re: tolac.   Problem list reviewed and updated.  Follow up in 3 weeks.  >50% of 20 min visit spent on counseling and coordination of care.     Cornelia Copaharlie Amyria Komar, Jr. MD Attending Center [facility](Faculty Practice)

## 2016-06-12 LAB — GC/CHLAMYDIA PROBE AMP (~~LOC~~) NOT AT ARMC
CHLAMYDIA, DNA PROBE: NEGATIVE
NEISSERIA GONORRHEA: NEGATIVE

## 2016-06-12 LAB — HCG, QUANTITATIVE, PREGNANCY: hCG, Beta Chain, Quant, S: 125145.1 m[IU]/mL — ABNORMAL HIGH

## 2016-06-18 ENCOUNTER — Telehealth: Payer: Self-pay | Admitting: *Deleted

## 2016-06-18 DIAGNOSIS — Z3490 Encounter for supervision of normal pregnancy, unspecified, unspecified trimester: Secondary | ICD-10-CM

## 2016-06-18 NOTE — Telephone Encounter (Signed)
Patient called for test results of her last visit. I informed her that all results were wnl. However Dr Julia Bautista wants a f/u u/s in 1-2 weeks. This is scheduled for Dec 1 @ 1100. Patient is aware.

## 2016-06-22 ENCOUNTER — Other Ambulatory Visit: Payer: Self-pay | Admitting: Obstetrics and Gynecology

## 2016-06-22 ENCOUNTER — Ambulatory Visit (HOSPITAL_COMMUNITY)
Admission: RE | Admit: 2016-06-22 | Discharge: 2016-06-22 | Disposition: A | Discharge status: Home / Self Care | Payer: Medicaid Other | Source: Ambulatory Visit | Attending: Obstetrics and Gynecology | Admitting: Obstetrics and Gynecology

## 2016-06-22 DIAGNOSIS — O3680X0 Pregnancy with inconclusive fetal viability, not applicable or unspecified: Secondary | ICD-10-CM | POA: Insufficient documentation

## 2016-06-22 DIAGNOSIS — Z3490 Encounter for supervision of normal pregnancy, unspecified, unspecified trimester: Secondary | ICD-10-CM

## 2016-06-22 DIAGNOSIS — Z3687 Encounter for antenatal screening for uncertain dates: Secondary | ICD-10-CM | POA: Diagnosis not present

## 2016-06-22 DIAGNOSIS — Z3A09 9 weeks gestation of pregnancy: Secondary | ICD-10-CM | POA: Insufficient documentation

## 2016-06-23 ENCOUNTER — Inpatient Hospital Stay (HOSPITAL_COMMUNITY)
Admission: AD | Admit: 2016-06-23 | Discharge: 2016-06-24 | Disposition: A | Discharge status: Home / Self Care | Payer: Medicaid Other | Source: Ambulatory Visit | Attending: Family Medicine | Admitting: Family Medicine

## 2016-06-23 DIAGNOSIS — Z882 Allergy status to sulfonamides status: Secondary | ICD-10-CM | POA: Insufficient documentation

## 2016-06-23 DIAGNOSIS — G43009 Migraine without aura, not intractable, without status migrainosus: Secondary | ICD-10-CM

## 2016-06-23 DIAGNOSIS — Z8249 Family history of ischemic heart disease and other diseases of the circulatory system: Secondary | ICD-10-CM | POA: Insufficient documentation

## 2016-06-23 DIAGNOSIS — Z833 Family history of diabetes mellitus: Secondary | ICD-10-CM | POA: Insufficient documentation

## 2016-06-23 DIAGNOSIS — Z87891 Personal history of nicotine dependence: Secondary | ICD-10-CM | POA: Insufficient documentation

## 2016-06-23 DIAGNOSIS — O99351 Diseases of the nervous system complicating pregnancy, first trimester: Secondary | ICD-10-CM | POA: Insufficient documentation

## 2016-06-23 DIAGNOSIS — Z3A09 9 weeks gestation of pregnancy: Secondary | ICD-10-CM | POA: Insufficient documentation

## 2016-06-23 DIAGNOSIS — Z885 Allergy status to narcotic agent status: Secondary | ICD-10-CM | POA: Insufficient documentation

## 2016-06-23 DIAGNOSIS — G43909 Migraine, unspecified, not intractable, without status migrainosus: Secondary | ICD-10-CM | POA: Insufficient documentation

## 2016-06-24 ENCOUNTER — Encounter (HOSPITAL_COMMUNITY): Payer: Self-pay | Admitting: *Deleted

## 2016-06-24 DIAGNOSIS — G43009 Migraine without aura, not intractable, without status migrainosus: Secondary | ICD-10-CM

## 2016-06-24 DIAGNOSIS — Z3A09 9 weeks gestation of pregnancy: Secondary | ICD-10-CM | POA: Diagnosis not present

## 2016-06-24 DIAGNOSIS — Z885 Allergy status to narcotic agent status: Secondary | ICD-10-CM | POA: Diagnosis not present

## 2016-06-24 DIAGNOSIS — Z882 Allergy status to sulfonamides status: Secondary | ICD-10-CM | POA: Diagnosis not present

## 2016-06-24 DIAGNOSIS — G43909 Migraine, unspecified, not intractable, without status migrainosus: Secondary | ICD-10-CM | POA: Diagnosis not present

## 2016-06-24 DIAGNOSIS — Z833 Family history of diabetes mellitus: Secondary | ICD-10-CM | POA: Diagnosis not present

## 2016-06-24 DIAGNOSIS — R51 Headache: Secondary | ICD-10-CM | POA: Diagnosis present

## 2016-06-24 DIAGNOSIS — O99351 Diseases of the nervous system complicating pregnancy, first trimester: Secondary | ICD-10-CM | POA: Diagnosis not present

## 2016-06-24 DIAGNOSIS — Z8249 Family history of ischemic heart disease and other diseases of the circulatory system: Secondary | ICD-10-CM | POA: Diagnosis not present

## 2016-06-24 DIAGNOSIS — Z87891 Personal history of nicotine dependence: Secondary | ICD-10-CM | POA: Diagnosis not present

## 2016-06-24 LAB — URINALYSIS, ROUTINE W REFLEX MICROSCOPIC
Bilirubin Urine: NEGATIVE
Glucose, UA: NEGATIVE mg/dL
Hgb urine dipstick: NEGATIVE
KETONES UR: NEGATIVE mg/dL
LEUKOCYTES UA: NEGATIVE
Nitrite: NEGATIVE
PH: 6.5 (ref 5.0–8.0)
Protein, ur: NEGATIVE mg/dL
Specific Gravity, Urine: 1.015 (ref 1.005–1.030)

## 2016-06-24 MED ORDER — PROMETHAZINE HCL 25 MG PO TABS
25.0000 mg | ORAL_TABLET | Freq: Once | ORAL | Status: AC
  Administered 2016-06-24: 25 mg via ORAL
  Filled 2016-06-24: qty 1

## 2016-06-24 MED ORDER — ACETAMINOPHEN 500 MG PO TABS
1000.0000 mg | ORAL_TABLET | Freq: Once | ORAL | Status: AC
  Administered 2016-06-24: 1000 mg via ORAL
  Filled 2016-06-24: qty 2

## 2016-06-24 MED ORDER — PROMETHAZINE HCL 25 MG PO TABS: 25.0000 mg | ORAL_TABLET | Freq: Four times a day (QID) | ORAL | 0 refills | Status: DC | PRN

## 2016-06-24 NOTE — Discharge Instructions (Signed)

## 2016-06-24 NOTE — MAU Note (Addendum)
Headache since 1300. Tylenol helped alittle. At 2230 awoke from nap and headache worse. Pink/brown spotting off and on since beginning of pregnancy. Loose stool x 2 tonight. Nausea. Hx headaches. H/A mostly in back of head

## 2016-06-24 NOTE — Progress Notes (Signed)
Written and verbal d/c instructions given and understanding voiced. 

## 2016-06-24 NOTE — MAU Provider Note (Signed)
History     CSN: [id number]  Arrival date and time: 06/23/16 2354   First Provider Initiated Contact with Patient 06/24/16 0119      Chief Complaint  Patient presents with  . Headache   HPI  Julia Bautista is a 35 y.o. female with a history of HA's  G7P1051 @ 4731w6d here in MAU with HA.  At 1430 the HA started; she took one tylenol (500 mg) and that took the HA away just a little bit. She laid down to take a nap and when she woke up she felt really nauseated.   OB History    Gravida Para Term Preterm AB Living   7 1 1   5 1    SAB TAB Ectopic Multiple Live Births   2 2 1   1       Obstetric Comments   EAB x 3 (all D&Cs), 2015 pLTCS @ 6cm per patient, SAB x 2      Past Medical History:  Diagnosis Date  . Abnormal Pap smear   . Asthma   . Herpes   . Other acne    ectopic pregnancy.     Past Surgical History:  Procedure Laterality Date  . CESAREAN SECTION  2005  . COLPOSCOPY  2013  . FINGER SURGERY    . LEEP  2013  . ROTATOR CUFF REPAIR    . TOOTH EXTRACTION    . UNILATERAL SALPINGECTOMY Right 2008   via mini-lap    Family History  Problem Relation Age of Onset  . Diabetes Mother   . Hypertension Sister     Social History  Substance Use Topics  . Smoking status: Former Smoker    Packs/day: 0.00    Years: 12.00    Types: E-cigarettes    Quit date: 06/04/2016  . Smokeless tobacco: Never Used     Comment: 1-800- ouit # given  . Alcohol use 0.0 oz/week     Comment: socially before pregancy    Allergies:  Allergies  Allergen Reactions  . Darvon [Propoxyphene] Itching  . Percocet [Oxycodone-Acetaminophen] Hives  . Sulfa Drugs Cross Reactors Hives    Prescriptions Prior to Admission  Medication Sig Dispense Refill Last Dose  . acetaminophen (TYLENOL) 500 MG tablet Take 500 mg by mouth every 6 (six) hours as needed.    at 1330  . Prenatal Vit-Fe Fumarate-FA (PRENATAL MULTIVITAMIN) TABS tablet Take 1 tablet by mouth daily at 12 noon.   06/24/2016 at  Unknown time  . albuterol (PROVENTIL HFA;VENTOLIN HFA) 108 (90 Base) MCG/ACT inhaler Inhale 2 puffs into the lungs every 6 (six) hours as needed for wheezing or shortness of breath. Reported on 01/25/2016   More than a month at Unknown time  . valACYclovir (VALTREX) 500 MG tablet Take 1,000 mg by mouth 2 (two) times daily.  4 Not Taking   Results for orders placed or performed during the hospital encounter of 06/23/16 (from the past 48 hour(s))  Urinalysis, Routine w reflex microscopic ([facility])     Status: None   Collection Time: 06/24/16 12:20 AM  Result Value Ref Range   Color, Urine YELLOW YELLOW   APPearance CLEAR CLEAR   Specific Gravity, Urine 1.015 1.005 - 1.030   pH 6.5 5.0 - 8.0   Glucose, UA NEGATIVE NEGATIVE mg/dL   Hgb urine dipstick NEGATIVE NEGATIVE   Bilirubin Urine NEGATIVE NEGATIVE   Ketones, ur NEGATIVE NEGATIVE mg/dL   Protein, ur NEGATIVE NEGATIVE mg/dL   Nitrite NEGATIVE  NEGATIVE   Leukocytes, UA NEGATIVE NEGATIVE    Comment: MICROSCOPIC NOT DONE ON URINES WITH NEGATIVE PROTEIN, BLOOD, LEUKOCYTES, NITRITE, OR GLUCOSE <1000 mg/dL.    Review of Systems  Eyes: Positive for photophobia.  Gastrointestinal: Positive for diarrhea (Diarrhea X2) and nausea.  Neurological: Positive for headaches.   Physical Exam   Blood pressure 121/78, pulse 74, temperature 98.8 F (37.1 C), resp. rate 20, height 5\' 3"  (1.6 m), weight 161 lb (73 kg), last menstrual period 04/21/2016.  Physical Exam  Constitutional: She is oriented to person, place, and time. She appears well-developed and well-nourished. No distress.  HENT:  Head: Normocephalic.  Respiratory: Effort normal.  Musculoskeletal: Normal range of motion.  Neurological: She is alert and oriented to person, place, and time. She has normal strength. No sensory deficit. GCS eye subscore is 4. GCS verbal subscore is 5. GCS motor subscore is 6.  Skin: Skin is warm. She is not diaphoretic.  Psychiatric: Her behavior is  normal.    MAU Course  Procedures  None  MDM  Phenergan 25 mg PO Tylenol 1 gram PO Patient says she is feeling much better and is ready to go home.   Assessment and Plan   A:  1. Migraine without aura and without status migrainosus, not intractable     P:  Discharge home in stable condition Rx: Phenergan Ok to take tylenol as directed on the bottle Return to MAU if symptoms worsen  Duane LopeJennifer I Rasch, NP 06/24/2016 2:40 AM

## 2016-06-25 ENCOUNTER — Encounter: Payer: Self-pay | Admitting: Obstetrics and Gynecology

## 2016-07-02 ENCOUNTER — Ambulatory Visit (INDEPENDENT_AMBULATORY_CARE_PROVIDER_SITE_OTHER): Payer: Medicaid Other | Admitting: Student

## 2016-07-02 VITALS — BP 126/80 | HR 77 | Wt 164.3 lb

## 2016-07-02 DIAGNOSIS — Z3490 Encounter for supervision of normal pregnancy, unspecified, unspecified trimester: Secondary | ICD-10-CM

## 2016-07-02 DIAGNOSIS — Z3491 Encounter for supervision of normal pregnancy, unspecified, first trimester: Secondary | ICD-10-CM

## 2016-07-02 NOTE — Addendum Note (Signed)
Addended by: Cheree DittoGRAHAM, Temitayo Covalt A on: 07/02/2016 02:06 PM   Modules accepted: Orders

## 2016-07-02 NOTE — Progress Notes (Addendum)
   PRENATAL VISIT NOTE  Subjective:  Julia DowdySophia L Bautista is a 35 y.o. [phone] at 2715w2d being seen today for ongoing prenatal care.  She is currently monitored for the following issues for this low-risk pregnancy and has History of abnormal cervical Pap smear; Supervision of normal pregnancy; history of Herpes; History of loop electrical excision procedure (LEEP); and History of cesarean delivery on her problem list.  Patient reports no complaints.  Contractions: Not present. Vag. Bleeding: Scant.  Movement: Absent. Denies leaking of fluid.   The following portions of the patient's history were reviewed and updated as appropriate: allergies, current medications, past family history, past medical history, past social history, past surgical history and problem list. Problem list updated.  Objective:   Vitals:   07/02/16 1102  BP: 126/80  Pulse: 77  Weight: 164 lb 4.8 oz (74.5 kg)    Fetal Status: Fetal Heart Rate (bpm): 160   Movement: Absent     General:  Alert, oriented and cooperative. Patient is in no acute distress.  Skin: Skin is warm and dry. No rash noted.   Cardiovascular: Normal heart rate noted  Respiratory: Normal respiratory effort, no problems with respiration noted  Abdomen: Soft, gravid, appropriate for gestational age. Pain/Pressure: Present     Pelvic:  Cervical exam deferred        Extremities: Normal range of motion.  Edema: None  Mental Status: Normal mood and affect. Normal behavior. Normal judgment and thought content.   Assessment and Plan:  Pregnancy: G7P1051 at 4115w2d  1. Encounter for supervision of normal pregnancy, antepartum, unspecified gravidity Patient feels well; no complaints.  - US MFM OB COMP + 14 WK; Future (scheduled for February 2018) - Prenatal Profile - Pain Mgmt, Profile 6 Conf w/o mM, U - Hemoglobinopathy Evaluation -Enrolled in babyscripts  -Patient has had bright pink spotting off and on; has not increased in severity in the past week.  Bleeding precautions reviewed.  -Patient to be scheduled for early 2 hour gtt based on family history; [facility] -Patient wants genetic screening (1st trimester); RN will schedule and call  2. Continue taking Valtrex    Please refer to After Visit Summary for other counseling recommendations.  Return in about 10 weeks (around 09/10/2016).   Marylene LandKathryn Lorraine Okie Bogacz, CNM

## 2016-07-02 NOTE — Addendum Note (Signed)
Addended by: Gerome ApleyZEYFANG, LINDA L on: 07/02/2016 01:27 PM   Modules accepted: Orders

## 2016-07-02 NOTE — Progress Notes (Signed)
C/o still has spotting about every other day .

## 2016-07-02 NOTE — Addendum Note (Signed)
Addended by: Chrystie NoseKOOISTRA, Raye Wiens L on: 07/02/2016 02:04 PM   Modules accepted: Orders

## 2016-07-02 NOTE — Patient Instructions (Signed)
We encourage spouses to attend prenatal visits.

## 2016-07-03 ENCOUNTER — Telehealth: Payer: Self-pay | Admitting: Student

## 2016-07-03 LAB — PRENATAL PROFILE (SOLSTAS)
Antibody Screen: NEGATIVE
BASOS PCT: 0 %
Basophils Absolute: 0 cells/uL (ref 0–200)
EOS ABS: 164 {cells}/uL (ref 15–500)
Eosinophils Relative: 4 %
HEMATOCRIT: 33.2 % — AB (ref 35.0–45.0)
HEP B S AG: NEGATIVE
HIV: NONREACTIVE
Hemoglobin: 10.6 g/dL — ABNORMAL LOW (ref 11.7–15.5)
LYMPHS ABS: 738 {cells}/uL — AB (ref 850–3900)
Lymphocytes Relative: 18 %
MCH: 25.4 pg — AB (ref 27.0–33.0)
MCHC: 31.9 g/dL — AB (ref 32.0–36.0)
MCV: 79.6 fL — AB (ref 80.0–100.0)
MONO ABS: 369 {cells}/uL (ref 200–950)
MPV: 10.2 fL (ref 7.5–12.5)
Monocytes Relative: 9 %
Neutro Abs: 2829 cells/uL (ref 1500–7800)
Neutrophils Relative %: 69 %
Platelets: 223 10*3/uL (ref 140–400)
RBC: 4.17 MIL/uL (ref 3.80–5.10)
RDW: 14.8 % (ref 11.0–15.0)
Rh Type: POSITIVE
Rubella: 23.6 Index — ABNORMAL HIGH (ref <0.90)
WBC: 4.1 10*3/uL (ref 3.8–10.8)

## 2016-07-05 LAB — HEMOGLOBINOPATHY EVALUATION
HEMATOCRIT: 33.2 % — AB (ref 35.0–45.0)
HEMOGLOBIN: 10.6 g/dL — AB (ref 11.7–15.5)
Hgb A2 Quant: 2.1 % (ref 1.8–3.5)
Hgb A: 96.9 % (ref >96.0)
MCH: 25.4 pg — ABNORMAL LOW (ref 27.0–33.0)
MCV: 79.6 fL — AB (ref 80.0–100.0)
RBC: 4.17 MIL/uL (ref 3.80–5.10)
RDW: 14.8 % (ref 11.0–15.0)

## 2016-07-09 ENCOUNTER — Encounter (HOSPITAL_COMMUNITY): Payer: Self-pay | Admitting: Student

## 2016-07-18 ENCOUNTER — Other Ambulatory Visit (HOSPITAL_COMMUNITY): Payer: Medicaid Other

## 2016-07-18 ENCOUNTER — Ambulatory Visit (HOSPITAL_COMMUNITY): Payer: Medicaid Other

## 2016-07-24 ENCOUNTER — Encounter (HOSPITAL_COMMUNITY): Payer: Self-pay

## 2016-07-24 ENCOUNTER — Ambulatory Visit (HOSPITAL_COMMUNITY)
Admission: RE | Admit: 2016-07-24 | Discharge: 2016-07-24 | Disposition: A | Discharge status: Home / Self Care | Payer: Medicaid Other | Source: Ambulatory Visit | Attending: Student | Admitting: Student

## 2016-07-24 ENCOUNTER — Inpatient Hospital Stay (HOSPITAL_COMMUNITY)
Admission: AD | Admit: 2016-07-24 | Discharge: 2016-07-24 | Disposition: A | Discharge status: Home / Self Care | Payer: Medicaid Other | Source: Ambulatory Visit | Attending: Obstetrics & Gynecology | Admitting: Obstetrics & Gynecology

## 2016-07-24 ENCOUNTER — Other Ambulatory Visit: Payer: Self-pay | Admitting: Student

## 2016-07-24 DIAGNOSIS — O99611 Diseases of the digestive system complicating pregnancy, first trimester: Secondary | ICD-10-CM | POA: Insufficient documentation

## 2016-07-24 DIAGNOSIS — Z87891 Personal history of nicotine dependence: Secondary | ICD-10-CM | POA: Diagnosis not present

## 2016-07-24 DIAGNOSIS — Z3A13 13 weeks gestation of pregnancy: Secondary | ICD-10-CM | POA: Insufficient documentation

## 2016-07-24 DIAGNOSIS — Z3682 Encounter for antenatal screening for nuchal translucency: Secondary | ICD-10-CM

## 2016-07-24 DIAGNOSIS — O34219 Maternal care for unspecified type scar from previous cesarean delivery: Secondary | ICD-10-CM | POA: Insufficient documentation

## 2016-07-24 DIAGNOSIS — Z885 Allergy status to narcotic agent status: Secondary | ICD-10-CM | POA: Diagnosis not present

## 2016-07-24 DIAGNOSIS — Z882 Allergy status to sulfonamides status: Secondary | ICD-10-CM | POA: Diagnosis not present

## 2016-07-24 DIAGNOSIS — A09 Infectious gastroenteritis and colitis, unspecified: Secondary | ICD-10-CM | POA: Diagnosis not present

## 2016-07-24 DIAGNOSIS — R1110 Vomiting, unspecified: Secondary | ICD-10-CM | POA: Diagnosis present

## 2016-07-24 DIAGNOSIS — K529 Noninfective gastroenteritis and colitis, unspecified: Secondary | ICD-10-CM | POA: Diagnosis not present

## 2016-07-24 DIAGNOSIS — Z3490 Encounter for supervision of normal pregnancy, unspecified, unspecified trimester: Secondary | ICD-10-CM

## 2016-07-24 LAB — URINALYSIS, ROUTINE W REFLEX MICROSCOPIC
Bilirubin Urine: NEGATIVE
GLUCOSE, UA: NEGATIVE mg/dL
HGB URINE DIPSTICK: NEGATIVE
KETONES UR: NEGATIVE mg/dL
NITRITE: NEGATIVE
PROTEIN: NEGATIVE mg/dL
Specific Gravity, Urine: 1.014 (ref 1.005–1.030)
pH: 7 (ref 5.0–8.0)

## 2016-07-24 MED ORDER — PROMETHAZINE HCL 25 MG PO TABS: 25.0000 mg | ORAL_TABLET | Freq: Four times a day (QID) | ORAL | 2 refills | Status: DC | PRN

## 2016-07-24 MED ORDER — PROMETHAZINE HCL 25 MG RE SUPP: 25.0000 mg | Freq: Once | RECTAL | Status: DC

## 2016-07-24 MED ORDER — PROMETHAZINE HCL 25 MG/ML IJ SOLN
25.0000 mg | Freq: Once | INTRAMUSCULAR | Status: AC
  Administered 2016-07-24: 25 mg via INTRAMUSCULAR
  Filled 2016-07-24: qty 1

## 2016-07-24 NOTE — Discharge Instructions (Signed)
Norovirus Infection °A norovirus infection is caused by exposure to a virus in a group of similar viruses (noroviruses). This type of infection causes inflammation in your stomach and intestines (gastroenteritis). Norovirus is the most common cause of gastroenteritis. It also causes food poisoning. °Anyone can get a norovirus infection. It spreads very easily (contagious). You can get it from contaminated food, water, surfaces, or other people. Norovirus is found in the stool or vomit of infected people. You can spread the infection as soon as you feel sick until 2 weeks after you recover.  °Symptoms usually begin within 2 days after you become infected. Most norovirus symptoms affect the digestive system. °CAUSES °Norovirus infection is caused by contact with norovirus. You can catch norovirus if you: °· Eat or drink something contaminated with norovirus. °· Touch surfaces or objects contaminated with norovirus and then put your hand in your mouth. °· Have direct contact with an infected person who has symptoms. °· Share food, drink, or utensils with someone with who is sick with norovirus. °SIGNS AND SYMPTOMS °Symptoms of norovirus may include: °· Nausea. °· Vomiting. °· Diarrhea. °· Stomach cramps. °· Fever. °· Chills. °· Headache. °· Muscle aches. °· Tiredness. °DIAGNOSIS °Your health care provider may suspect norovirus based on your symptoms and physical exam. Your health care provider may also test a sample of your stool or vomit for the virus.  °TREATMENT °There is no specific treatment for norovirus. Most people get better without treatment in about 2 days. °HOME CARE INSTRUCTIONS °· Replace lost fluids by drinking plenty of water or rehydration fluids containing important minerals called electrolytes. This prevents dehydration. Drink enough fluid to keep your urine clear or pale yellow. °· Do not prepare food for others while you are infected. Wait at least 3 days after recovering from the illness to do  that. °PREVENTION  °· Wash your hands often, especially after using the toilet or changing a diaper. °· Wash fruits and vegetables thoroughly before preparing or serving them. °· Throw out any food that a sick person may have touched. °· Disinfect contaminated surfaces immediately after someone in the household has been sick. Use a bleach-based household cleaner. °· Immediately remove and wash soiled clothes or sheets. °SEEK MEDICAL CARE IF: °· Your vomiting, diarrhea, and stomach pain is getting worse. °· Your symptoms of norovirus do not go away after 2-3 days. °SEEK IMMEDIATE MEDICAL CARE IF:  °You develop symptoms of dehydration that do not improve with fluid replacement. This may include: °· Excessive sleepiness. °· Lack of tears. °· Dry mouth. °· Dizziness when standing. °· Weak pulse. °This information is not intended to replace advice given to you by your health care provider. Make sure you discuss any questions you have with your health care provider. °Document Released: 09/29/2002 Document Revised: 07/30/2014 Document Reviewed: 12/17/2013 °Elsevier Interactive Patient Education © 2017 [employer] ° °

## 2016-07-24 NOTE — MAU Provider Note (Signed)
Chief Complaint: Emesis and Diarrhea   First Provider Initiated Contact with Patient 07/24/16 2054        SUBJECTIVE HPI: Julia Bautista is a 36 y.o. [id number] at [redacted]w[redacted]d by LMP who presents to maternity admissions reporting vomiting and diarrhea which started about the same time today about 2 hours ago.  Entire family has been sick. Patient's mother, who drove her here, has been vomiting in lobby.  Has not tried any medicine. Has not kept anything down for 2 hours. . She denies vaginal bleeding, vaginal itching/burning, urinary symptoms, h/a, dizziness, or fever/chills.    Emesis   This is a new problem. The current episode started today. The problem occurs 2 to 4 times per day. The problem has been unchanged. There has been no fever. Associated symptoms include diarrhea. Pertinent negatives include no abdominal pain, chest pain, chills, coughing, dizziness, fever, headaches or myalgias. Risk factors include ill contacts. She has tried nothing for the symptoms.  Diarrhea   This is a new problem. The current episode started today. The problem has been unchanged. Associated symptoms include vomiting. Pertinent negatives include no abdominal pain, chills, coughing, fever, headaches or myalgias. Nothing aggravates the symptoms. Risk factors include ill contacts. She has tried nothing for the symptoms.   RN Note: Pt presents complaining of vomiting and diarrhea that started today about 2 hours ago. Everyone in her home has the same thing. Has not been able to keep anything down since it started. Denies vaginal bleeding or discharge. Also having abdominal pain  Past Medical History:  Diagnosis Date  . Abnormal Pap smear   . Asthma   . Herpes   . Other acne    ectopic pregnancy.    Past Surgical History:  Procedure Laterality Date  . CESAREAN SECTION  2005  . COLPOSCOPY  2013  . FINGER SURGERY    . LEEP  2013  . ROTATOR CUFF REPAIR    . TOOTH EXTRACTION    . UNILATERAL SALPINGECTOMY Right  2008   via mini-lap   Social History   Social History  . Marital status: Single    Spouse name: N/A  . Number of children: N/A  . Years of education: N/A   Occupational History  . Not on file.   Social History Main Topics  . Smoking status: Former Smoker    Packs/day: 0.00    Years: 12.00    Types: E-cigarettes    Quit date: 06/04/2016  . Smokeless tobacco: Never Used     Comment: 1-800- ouit # given  . Alcohol use 0.0 oz/week     Comment: socially before pregancy  . Drug use: No  . Sexual activity: Yes    Partners: Male    Birth control/ protection: None   Other Topics Concern  . Not on file   Social History Narrative  . No narrative on file   No current facility-administered medications on file prior to encounter.    Current Outpatient Prescriptions on File Prior to Encounter  Medication Sig Dispense Refill  . acetaminophen (TYLENOL) 500 MG tablet Take 500 mg by mouth every 6 (six) hours as needed.    Marland Kitchen albuterol (PROVENTIL HFA;VENTOLIN HFA) 108 (90 Base) MCG/ACT inhaler Inhale 2 puffs into the lungs every 6 (six) hours as needed for wheezing or shortness of breath. Reported on 01/25/2016    . Prenatal Vit-Fe Fumarate-FA (PRENATAL MULTIVITAMIN) TABS tablet Take 1 tablet by mouth daily at 12 noon.    . promethazine (PHENERGAN) 25  MG tablet Take 1 tablet (25 mg total) by mouth every 6 (six) hours as needed for nausea or vomiting. (Patient not taking: Reported on 07/24/2016) 30 tablet 0  . valACYclovir (VALTREX) 500 MG tablet Take 1,000 mg by mouth 2 (two) times daily.  4   Allergies  Allergen Reactions  . Darvon [Propoxyphene] Itching  . Percocet [Oxycodone-Acetaminophen] Hives  . Sulfa Drugs Cross Reactors Hives    I have reviewed patient's Past Medical Hx, Surgical Hx, Family Hx, Social Hx, medications and allergies.   ROS:  Review of Systems  Constitutional: Negative for chills and fever.  Respiratory: Negative for cough.   Cardiovascular: Negative for chest  pain.  Gastrointestinal: Positive for diarrhea and vomiting. Negative for abdominal pain.  Musculoskeletal: Negative for myalgias.  Neurological: Negative for dizziness and headaches.   Review of Systems  Other systems negative   Physical Exam  Physical Exam Patient Vitals for the past 24 hrs:  BP Temp Temp src Pulse Resp  07/24/16 2003 140/89 97.8 F (36.6 C) Oral 91 18   Constitutional: Well-developed, well-nourished female in no acute distress.  Cardiovascular: normal rate Respiratory: normal effort GI: Abd soft, non-tender. Pos BS x 4 MS: Extremities nontender, no edema, normal ROM Neurologic: Alert and oriented x 4.  GU: Neg CVAT.  PELVIC EXAM: deferred  FHT 160 by doppler  LAB RESULTS Results for orders placed or performed during the hospital encounter of 07/24/16 (from the past 24 hour(s))  Urinalysis, Routine w reflex microscopic     Status: Abnormal   Collection Time: 07/24/16  8:01 PM  Result Value Ref Range   Color, Urine YELLOW YELLOW   APPearance HAZY (A) CLEAR   Specific Gravity, Urine 1.014 1.005 - 1.030   pH 7.0 5.0 - 8.0   Glucose, UA NEGATIVE NEGATIVE mg/dL   Hgb urine dipstick NEGATIVE NEGATIVE   Bilirubin Urine NEGATIVE NEGATIVE   Ketones, ur NEGATIVE NEGATIVE mg/dL   Protein, ur NEGATIVE NEGATIVE mg/dL   Nitrite NEGATIVE NEGATIVE   Leukocytes, UA MODERATE (A) NEGATIVE   RBC / HPF 0-5 0 - 5 RBC/hpf   WBC, UA 0-5 0 - 5 WBC/hpf   Bacteria, UA RARE (A) NONE SEEN   Squamous Epithelial / LPF 6-30 (A) NONE SEEN   Mucous PRESENT     AB/POS/-- (12/11 1205)  IMAGING No results found.  MAU Management/MDM: Urinalysis showed no evidence of dehydration Given the short history of symptoms, will give a dose of Phenergan IM (per pt request as opposed to suppository) and discharge home.  Does not need IV fluids at this time   ASSESSMENT SIUP at 284w3d Gastroenteritis, likely norovirus  PLAN Discharge home Conservative care  Rx Phenergan for PO or  PV use PRN nausea Advised to stay out of work until 24 hrs after symptoms passed   Pt stable at time of discharge. Encouraged to return here or to other Urgent Care/ED if she develops worsening of symptoms, increase in pain, fever, or other concerning symptoms.    Wynelle BourgeoisMarie Ha Placeres CNM, MSN Certified Nurse-Midwife 07/24/2016  8:55 PM

## 2016-07-24 NOTE — MAU Note (Signed)
Pt presents complaining of vomiting and diarrhea that started today about 2 hours ago. Everyone in her home has the same thing. Has not been able to keep anything down since it started. Denies vaginal bleeding or discharge. Also having abdominal pain.

## 2016-07-30 ENCOUNTER — Other Ambulatory Visit: Payer: Self-pay | Admitting: Student

## 2016-08-15 ENCOUNTER — Encounter (HOSPITAL_COMMUNITY): Payer: Self-pay | Admitting: *Deleted

## 2016-08-15 ENCOUNTER — Inpatient Hospital Stay (HOSPITAL_COMMUNITY): Payer: Medicaid Other

## 2016-08-15 ENCOUNTER — Inpatient Hospital Stay (HOSPITAL_COMMUNITY)
Admission: AD | Admit: 2016-08-15 | Discharge: 2016-08-15 | Disposition: A | Discharge status: Home / Self Care | Payer: Medicaid Other | Source: Ambulatory Visit | Attending: Obstetrics & Gynecology | Admitting: Obstetrics & Gynecology

## 2016-08-15 DIAGNOSIS — R109 Unspecified abdominal pain: Secondary | ICD-10-CM | POA: Diagnosis not present

## 2016-08-15 DIAGNOSIS — Z87891 Personal history of nicotine dependence: Secondary | ICD-10-CM | POA: Diagnosis not present

## 2016-08-15 DIAGNOSIS — R1030 Lower abdominal pain, unspecified: Secondary | ICD-10-CM | POA: Diagnosis present

## 2016-08-15 DIAGNOSIS — O210 Mild hyperemesis gravidarum: Secondary | ICD-10-CM | POA: Diagnosis not present

## 2016-08-15 DIAGNOSIS — O26892 Other specified pregnancy related conditions, second trimester: Secondary | ICD-10-CM

## 2016-08-15 DIAGNOSIS — R102 Pelvic and perineal pain: Secondary | ICD-10-CM | POA: Diagnosis not present

## 2016-08-15 DIAGNOSIS — Z885 Allergy status to narcotic agent status: Secondary | ICD-10-CM | POA: Insufficient documentation

## 2016-08-15 DIAGNOSIS — Z3A16 16 weeks gestation of pregnancy: Secondary | ICD-10-CM | POA: Diagnosis not present

## 2016-08-15 DIAGNOSIS — O219 Vomiting of pregnancy, unspecified: Secondary | ICD-10-CM | POA: Diagnosis not present

## 2016-08-15 DIAGNOSIS — Z3490 Encounter for supervision of normal pregnancy, unspecified, unspecified trimester: Secondary | ICD-10-CM

## 2016-08-15 DIAGNOSIS — O26899 Other specified pregnancy related conditions, unspecified trimester: Secondary | ICD-10-CM

## 2016-08-15 LAB — CBC WITH DIFFERENTIAL/PLATELET
BASOS PCT: 0 %
Basophils Absolute: 0 10*3/uL (ref 0.0–0.1)
EOS ABS: 0 10*3/uL (ref 0.0–0.7)
EOS PCT: 0 %
HCT: 29.4 % — ABNORMAL LOW (ref 36.0–46.0)
Hemoglobin: 9.9 g/dL — ABNORMAL LOW (ref 12.0–15.0)
Lymphocytes Relative: 11 %
Lymphs Abs: 0.6 10*3/uL — ABNORMAL LOW (ref 0.7–4.0)
MCH: 25.4 pg — ABNORMAL LOW (ref 26.0–34.0)
MCHC: 33.7 g/dL (ref 30.0–36.0)
MCV: 75.6 fL — ABNORMAL LOW (ref 78.0–100.0)
MONO ABS: 0.2 10*3/uL (ref 0.1–1.0)
MONOS PCT: 4 %
Neutro Abs: 4.4 10*3/uL (ref 1.7–7.7)
Neutrophils Relative %: 84 %
PLATELETS: 200 10*3/uL (ref 150–400)
RBC: 3.89 MIL/uL (ref 3.87–5.11)
RDW: 12.2 % (ref 11.5–15.5)
WBC: 5.3 10*3/uL (ref 4.0–10.5)

## 2016-08-15 LAB — URINALYSIS, ROUTINE W REFLEX MICROSCOPIC
BILIRUBIN URINE: NEGATIVE
GLUCOSE, UA: NEGATIVE mg/dL
Hgb urine dipstick: NEGATIVE
KETONES UR: NEGATIVE mg/dL
LEUKOCYTES UA: NEGATIVE
NITRITE: NEGATIVE
PH: 9 — AB (ref 5.0–8.0)
Protein, ur: NEGATIVE mg/dL
Specific Gravity, Urine: 1.014 (ref 1.005–1.030)

## 2016-08-15 MED ORDER — HYDROMORPHONE HCL 1 MG/ML IJ SOLN
INTRAMUSCULAR | Status: AC
  Administered 2016-08-15: 1 mg via INTRAMUSCULAR
  Filled 2016-08-15: qty 1

## 2016-08-15 MED ORDER — CYCLOBENZAPRINE HCL 10 MG PO TABS: 10.0000 mg | ORAL_TABLET | Freq: Two times a day (BID) | ORAL | 0 refills | Status: DC | PRN

## 2016-08-15 MED ORDER — HYDROMORPHONE HCL 1 MG/ML IJ SOLN
1.0000 mg | Freq: Once | INTRAMUSCULAR | Status: AC
  Administered 2016-08-15: 1 mg via INTRAMUSCULAR

## 2016-08-15 MED ORDER — HYDROMORPHONE HCL 1 MG/ML IJ SOLN
1.0000 mg | Freq: Once | INTRAMUSCULAR | Status: AC
  Administered 2016-08-15: 1 mg via INTRAMUSCULAR
  Filled 2016-08-15: qty 1

## 2016-08-15 MED ORDER — ONDANSETRON HCL 4 MG PO TABS: 4.0000 mg | ORAL_TABLET | Freq: Four times a day (QID) | ORAL | 0 refills | Status: DC

## 2016-08-15 NOTE — Discharge Instructions (Signed)
Round Ligament Pain Introduction The round ligament is a cord of muscle and tissue that helps to support the uterus. It can become a source of pain during pregnancy if it becomes stretched or twisted as the baby grows. The pain usually begins in the second trimester of pregnancy, and it can come and go until the baby is delivered. It is not a serious problem, and it does not cause harm to the baby. Round ligament pain is usually a short, sharp, and pinching pain, but it can also be a dull, lingering, and aching pain. The pain is felt in the lower side of the abdomen or in the groin. It usually starts deep in the groin and moves up to the outside of the hip area. Pain can occur with:  A sudden change in position.  Rolling over in bed.  Coughing or sneezing.  Physical activity. Follow these instructions at home: Watch your condition for any changes. Take these steps to help with your pain:  When the pain starts, relax. Then try:  Sitting down.  Flexing your knees up to your abdomen.  Lying on your side with one pillow under your abdomen and another pillow between your legs.  Sitting in a warm bath for 15-20 minutes or until the pain goes away.  Take over-the-counter and prescription medicines only as told by your health care provider.  Move slowly when you sit and stand.  Avoid long walks if they cause pain.  Stop or lessen your physical activities if they cause pain. Contact a health care provider if:  Your pain does not go away with treatment.  You feel pain in your back that you did not have before.  Your medicine is not helping. Get help right away if:  You develop a fever or chills.  You develop uterine contractions.  You develop vaginal bleeding.  You develop nausea or vomiting.  You develop diarrhea.  You have pain when you urinate. This information is not intended to replace advice given to you by your health care provider. Make sure you discuss any questions  you have with your health care provider. Document Released: 04/17/2008 Document Revised: 12/15/2015 Document Reviewed: 09/15/2014  2017 Elsevier  

## 2016-08-15 NOTE — MAU Provider Note (Signed)
History     CSN: [id number]  Arrival date and time: 08/15/16 4098   First Provider Initiated Contact with Patient 08/15/16 [phone]      Chief Complaint  Patient presents with  . Abdominal Pain  . Back Pain   HPI [name] is a 36 y.o. [phone] at [redacted]w[redacted]d who presents to MAU today with complaint of sudden onset lower abdominal and low back pain similar to contractions since 5 am today. The patient denies vaginal bleeding, discharge, LOF, fever or UTI symptoms. She has had N/V since onset of pain. She states pain is severe and comes and goes. She has not taken any pain medication.    OB History    Gravida Para Term Preterm AB Living   7 1 1   5 1    SAB TAB Ectopic Multiple Live Births   2 2 1   1       Obstetric Comments   EAB x 3 (all D&Cs), 2015 pLTCS @ 6cm per patient, SAB x 2      Past Medical History:  Diagnosis Date  . Abnormal Pap smear   . Asthma   . Herpes   . Other acne    ectopic pregnancy.     Past Surgical History:  Procedure Laterality Date  . CESAREAN SECTION  2005  . COLPOSCOPY  2013  . FINGER SURGERY    . LEEP  2013  . ROTATOR CUFF REPAIR    . TOOTH EXTRACTION    . UNILATERAL SALPINGECTOMY Right 2008   via mini-lap    Family History  Problem Relation Age of Onset  . Diabetes Mother   . Hypertension Sister     Social History  Substance Use Topics  . Smoking status: Former Smoker    Packs/day: 0.00    Years: 12.00    Types: E-cigarettes    Quit date: 06/04/2016  . Smokeless tobacco: Never Used     Comment: 1-800- ouit # given  . Alcohol use 0.0 oz/week     Comment: socially before pregancy    Allergies:  Allergies  Allergen Reactions  . Darvon [Propoxyphene] Itching  . Percocet [Oxycodone-Acetaminophen] Hives  . Sulfa Drugs Cross Reactors Hives    Prescriptions Prior to Admission  Medication Sig Dispense Refill Last Dose  . acetaminophen (TYLENOL) 500 MG tablet Take 500 mg by mouth every 6 (six) hours as needed.   Past  Week at Unknown time  . Prenatal Vit-Fe Fumarate-FA (PRENATAL MULTIVITAMIN) TABS tablet Take 1 tablet by mouth daily at 12 noon.   08/14/2016 at Unknown time  . albuterol (PROVENTIL HFA;VENTOLIN HFA) 108 (90 Base) MCG/ACT inhaler Inhale 2 puffs into the lungs every 6 (six) hours as needed for wheezing or shortness of breath. Reported on 01/25/2016   rescue  . promethazine (PHENERGAN) 25 MG tablet Take 1 tablet (25 mg total) by mouth every 6 (six) hours as needed for nausea or vomiting (may use vaginally if necessary). (Patient not taking: Reported on 08/15/2016) 30 tablet 2 Not Taking at Unknown time    Review of Systems  Constitutional: Negative for fever.  Gastrointestinal: Positive for abdominal pain, nausea and vomiting. Negative for constipation and diarrhea.  Genitourinary: Negative for dysuria, frequency, urgency, vaginal bleeding and vaginal discharge.   Physical Exam   Blood pressure 122/83, pulse 82, temperature 97.9 F (36.6 C), temperature source Oral, resp. rate 22, last menstrual period 04/21/2016.  Physical Exam  Nursing note and vitals reviewed. Constitutional: She is oriented to person,  place, and time. She appears well-developed and well-nourished. No distress.  HENT:  Head: Normocephalic and atraumatic.  Cardiovascular: Normal rate.   Respiratory: Effort normal.  GI: Soft. She exhibits no distension and no mass. There is tenderness (moderate tenderness to palpation of the lower abdomen bilaterally). There is no rebound and no guarding.  Neurological: She is alert and oriented to person, place, and time.  Skin: Skin is warm and dry. No erythema.  Psychiatric: She has a normal mood and affect.  Dilation: Closed Exam by:: Harlon FlorJ. Michalina Calbert PA  Results for orders placed or performed during the hospital encounter of 08/15/16 (from the past 24 hour(s))  Urinalysis, Routine w reflex microscopic     Status: Abnormal   Collection Time: 08/15/16  9:05 AM  Result Value Ref Range    Color, Urine YELLOW YELLOW   APPearance HAZY (A) CLEAR   Specific Gravity, Urine 1.014 1.005 - 1.030   pH 9.0 (H) 5.0 - 8.0   Glucose, UA NEGATIVE NEGATIVE mg/dL   Hgb urine dipstick NEGATIVE NEGATIVE   Bilirubin Urine NEGATIVE NEGATIVE   Ketones, ur NEGATIVE NEGATIVE mg/dL   Protein, ur NEGATIVE NEGATIVE mg/dL   Nitrite NEGATIVE NEGATIVE   Leukocytes, UA NEGATIVE NEGATIVE  CBC with Differential/Platelet     Status: Abnormal   Collection Time: 08/15/16  9:33 AM  Result Value Ref Range   WBC 5.3 4.0 - 10.5 K/uL   RBC 3.89 3.87 - 5.11 MIL/uL   Hemoglobin 9.9 (L) 12.0 - 15.0 g/dL   HCT 16.129.4 (L) 09.636.0 - 04.546.0 %   MCV 75.6 (L) 78.0 - 100.0 fL   MCH 25.4 (L) 26.0 - 34.0 pg   MCHC 33.7 30.0 - 36.0 g/dL   RDW 40.912.2 81.111.5 - 91.415.5 %   Platelets 200 150 - 400 K/uL   Neutrophils Relative % 84 %   Neutro Abs 4.4 1.7 - 7.7 K/uL   Lymphocytes Relative 11 %   Lymphs Abs 0.6 (L) 0.7 - 4.0 K/uL   Monocytes Relative 4 %   Monocytes Absolute 0.2 0.1 - 1.0 K/uL   Eosinophils Relative 0 %   Eosinophils Absolute 0.0 0.0 - 0.7 K/uL   Basophils Relative 0 %   Basophils Absolute 0.0 0.0 - 0.1 K/uL    MAU Course  Procedures None  MDM FHR - 147 bpm with doppler UA, CBC today  1 mg IM Dilaudid given for pain - patient denies allergic reactions to Dilaudid in the past 1 mg IM Dilaudid given for continued pain. Patient reports some improvement in pain.  US ordered to evaluate cervical length due to continued pain.  Preliminary US shows normal AFI, FHR, placenta and cervical length 3.5 cm Assessment and Plan  A: SIUP at 7429w4d Round ligament pain Nausea and vomiting in pregnancy prior to [redacted] weeks gestation  P: Discharge home Rx for Flexeril and Zofran given to patient  Warning signs for worsening condition discussed Patient advised to follow-up with [facility] as scheduled for routine prenatal care Patient may return to MAU as needed or if her condition were to change or worsen   Marny LowensteinJulie N Damian Hofstra,  PA-C  08/15/2016, 11:48 AM

## 2016-08-15 NOTE — MAU Note (Addendum)
Pt reports sudden onset of severe abdominal pain & back pain @ 0500, states it feels like contractions.  Denies bleeding or LOF.  Also started vomiting around the same time, denies diarrhea or fever.  Has not taken anything for pain.

## 2016-09-03 ENCOUNTER — Ambulatory Visit (HOSPITAL_COMMUNITY)
Admission: RE | Admit: 2016-09-03 | Discharge: 2016-09-03 | Disposition: A | Discharge status: Home / Self Care | Payer: Medicaid Other | Source: Ambulatory Visit | Attending: Student | Admitting: Student

## 2016-09-03 ENCOUNTER — Other Ambulatory Visit: Payer: Self-pay | Admitting: Student

## 2016-09-03 DIAGNOSIS — Z3689 Encounter for other specified antenatal screening: Secondary | ICD-10-CM | POA: Insufficient documentation

## 2016-09-03 DIAGNOSIS — Z3A19 19 weeks gestation of pregnancy: Secondary | ICD-10-CM | POA: Diagnosis not present

## 2016-09-03 DIAGNOSIS — Z3490 Encounter for supervision of normal pregnancy, unspecified, unspecified trimester: Secondary | ICD-10-CM

## 2016-09-03 DIAGNOSIS — O34211 Maternal care for low transverse scar from previous cesarean delivery: Secondary | ICD-10-CM | POA: Diagnosis not present

## 2016-09-10 ENCOUNTER — Ambulatory Visit (INDEPENDENT_AMBULATORY_CARE_PROVIDER_SITE_OTHER): Payer: Medicaid Other | Admitting: Obstetrics & Gynecology

## 2016-09-10 VITALS — BP 117/78 | HR 85 | Wt 180.4 lb

## 2016-09-10 DIAGNOSIS — Z3492 Encounter for supervision of normal pregnancy, unspecified, second trimester: Secondary | ICD-10-CM

## 2016-09-10 DIAGNOSIS — Z87898 Personal history of other specified conditions: Secondary | ICD-10-CM

## 2016-09-10 DIAGNOSIS — O34219 Maternal care for unspecified type scar from previous cesarean delivery: Secondary | ICD-10-CM

## 2016-09-10 DIAGNOSIS — Z833 Family history of diabetes mellitus: Secondary | ICD-10-CM

## 2016-09-10 DIAGNOSIS — Z8742 Personal history of other diseases of the female genital tract: Secondary | ICD-10-CM

## 2016-09-10 DIAGNOSIS — B009 Herpesviral infection, unspecified: Secondary | ICD-10-CM

## 2016-09-10 DIAGNOSIS — Z9889 Other specified postprocedural states: Secondary | ICD-10-CM

## 2016-09-10 DIAGNOSIS — Z98891 History of uterine scar from previous surgery: Secondary | ICD-10-CM

## 2016-09-10 NOTE — Progress Notes (Signed)
   PRENATAL VISIT NOTE  Subjective:  Julia DowdySophia L Ingber is a 36 y.o. [phone] at 149w2d being seen today for ongoing prenatal care.  She is currently monitored for the following issues for this low-risk pregnancy and has History of abnormal cervical Pap smear; Supervision of normal pregnancy; history of Herpes; History of loop electrical excision procedure (LEEP); and History of cesarean delivery on her problem list.  Patient reports no complaints.  Contractions: Not present. Vag. Bleeding: None.  Movement: Present. Denies leaking of fluid.   The following portions of the patient's history were reviewed and updated as appropriate: allergies, current medications, past family history, past medical history, past social history, past surgical history and problem list. Problem list updated.  Objective:   Vitals:   09/10/16 1059  BP: 117/78  Pulse: 85  Weight: 180 lb 6.4 oz (81.8 kg)    Fetal Status: Fetal Heart Rate (bpm): 146   Movement: Present     General:  Alert, oriented and cooperative. Patient is in no acute distress.  Skin: Skin is warm and dry. No rash noted.   Cardiovascular: Normal heart rate noted  Respiratory: Normal respiratory effort, no problems with respiration noted  Abdomen: Soft, gravid, appropriate for gestational age. Pain/Pressure: Present     Pelvic:  Cervical exam deferred        Extremities: Normal range of motion.  Edema: None  Mental Status: Normal mood and affect. Normal behavior. Normal judgment and thought content.   Assessment and Plan:  Pregnancy: [id number] at 6549w2d  1. Encounter for supervision of normal pregnancy in second trimester, unspecified gravidity [employer]--low risk model--COMPLIANT  2. History of loop electrical excision procedure (LEEP) Cx length nml at 20 weeks  3. History of abnormal cervical Pap smear colpo nml in 2017  4. history of Herpes Valtrex at 35 weeks  5. History of cesarean delivery Most likely wants rpt c/s  6. Family  history of diabetes mellitus in mother - AC large (93%--AA, + family history-->pt wants 2 hour GTT) - Glucose Tolerance, 2 Hours w/1 Hour  Preterm labor symptoms and general obstetric precautions including but not limited to vaginal bleeding, contractions, leaking of fluid and fetal movement were reviewed in detail with the patient. Please refer to After Visit Summary for other counseling recommendations.  Return in 8 weeks (on 11/05/2016).   Lesly DukesKelly H Benicia Bergevin, MD

## 2016-09-10 NOTE — Progress Notes (Signed)
Baby scripts blood pressure are written normal range

## 2016-09-10 NOTE — Progress Notes (Signed)
Home Medicaid Form Completed  

## 2016-09-11 ENCOUNTER — Encounter (HOSPITAL_COMMUNITY): Payer: Self-pay | Admitting: *Deleted

## 2016-09-11 ENCOUNTER — Inpatient Hospital Stay (HOSPITAL_COMMUNITY)
Admission: AD | Admit: 2016-09-11 | Discharge: 2016-09-11 | Disposition: A | Discharge status: Home / Self Care | Payer: Medicaid Other | Source: Ambulatory Visit | Attending: Family Medicine | Admitting: Family Medicine

## 2016-09-11 DIAGNOSIS — Z882 Allergy status to sulfonamides status: Secondary | ICD-10-CM | POA: Insufficient documentation

## 2016-09-11 DIAGNOSIS — Z833 Family history of diabetes mellitus: Secondary | ICD-10-CM | POA: Diagnosis not present

## 2016-09-11 DIAGNOSIS — B9789 Other viral agents as the cause of diseases classified elsewhere: Secondary | ICD-10-CM

## 2016-09-11 DIAGNOSIS — Z3A20 20 weeks gestation of pregnancy: Secondary | ICD-10-CM | POA: Diagnosis not present

## 2016-09-11 DIAGNOSIS — Z885 Allergy status to narcotic agent status: Secondary | ICD-10-CM | POA: Insufficient documentation

## 2016-09-11 DIAGNOSIS — J329 Chronic sinusitis, unspecified: Secondary | ICD-10-CM | POA: Diagnosis not present

## 2016-09-11 DIAGNOSIS — Z3492 Encounter for supervision of normal pregnancy, unspecified, second trimester: Secondary | ICD-10-CM

## 2016-09-11 DIAGNOSIS — O99512 Diseases of the respiratory system complicating pregnancy, second trimester: Secondary | ICD-10-CM | POA: Diagnosis not present

## 2016-09-11 DIAGNOSIS — Z8249 Family history of ischemic heart disease and other diseases of the circulatory system: Secondary | ICD-10-CM | POA: Insufficient documentation

## 2016-09-11 DIAGNOSIS — R0981 Nasal congestion: Secondary | ICD-10-CM | POA: Diagnosis present

## 2016-09-11 DIAGNOSIS — Z87891 Personal history of nicotine dependence: Secondary | ICD-10-CM | POA: Diagnosis not present

## 2016-09-11 DIAGNOSIS — J069 Acute upper respiratory infection, unspecified: Secondary | ICD-10-CM | POA: Insufficient documentation

## 2016-09-11 LAB — URINALYSIS, ROUTINE W REFLEX MICROSCOPIC
Bilirubin Urine: NEGATIVE
Glucose, UA: NEGATIVE mg/dL
HGB URINE DIPSTICK: NEGATIVE
KETONES UR: NEGATIVE mg/dL
Leukocytes, UA: NEGATIVE
Nitrite: NEGATIVE
Protein, ur: NEGATIVE mg/dL
Specific Gravity, Urine: 1.011 (ref 1.005–1.030)
pH: 7 (ref 5.0–8.0)

## 2016-09-11 LAB — GLUCOSE, CAPILLARY: Glucose-Capillary: 99 mg/dL (ref 65–99)

## 2016-09-11 NOTE — Discharge Instructions (Signed)

## 2016-09-11 NOTE — MAU Provider Note (Signed)
History     CSN: [id number]  Arrival date and time: 09/11/16 1112   First Provider Initiated Contact with Patient 09/11/16 1146      Chief Complaint  Patient presents with  . Sinusitis  . Cough  . nasal drainage  . Abdominal Pain  . Fatigue   HPI [name] is a 36 y.o. [phone] at 4957w3d who presents to MAU today with complaint of nasal congestion, non-productive cough and pulling in her abdomen today x 3 days. She denies fever, ear pain, sick contact, N/V, vaginal bleeding, LOF or contractions.   OB History    Gravida Para Term Preterm AB Living   7 1 1   5 1    SAB TAB Ectopic Multiple Live Births   2 2 1   1       Obstetric Comments   EAB x 3 (all D&Cs), 2015 pLTCS @ 6cm per patient, SAB x 2      Past Medical History:  Diagnosis Date  . Abnormal Pap smear   . Asthma   . Herpes   . Other acne    ectopic pregnancy.     Past Surgical History:  Procedure Laterality Date  . CESAREAN SECTION  2005  . COLPOSCOPY  2013  . FINGER SURGERY    . LEEP  2013  . ROTATOR CUFF REPAIR    . TOOTH EXTRACTION    . UNILATERAL SALPINGECTOMY Right 2008   via mini-lap    Family History  Problem Relation Age of Onset  . Diabetes Mother   . Hypertension Sister     Social History  Substance Use Topics  . Smoking status: Former Smoker    Packs/day: 0.00    Years: 12.00    Types: E-cigarettes    Quit date: 06/04/2016  . Smokeless tobacco: Never Used     Comment: 1-800- ouit # given  . Alcohol use 0.0 oz/week     Comment: socially before pregancy    Allergies:  Allergies  Allergen Reactions  . Darvon [Propoxyphene] Itching  . Percocet [Oxycodone-Acetaminophen] Hives  . Sulfa Drugs Cross Reactors Hives    Prescriptions Prior to Admission  Medication Sig Dispense Refill Last Dose  . acetaminophen (TYLENOL) 500 MG tablet Take 500 mg by mouth every 6 (six) hours as needed for moderate pain.    Past Week at Unknown time  . albuterol (PROVENTIL HFA;VENTOLIN  HFA) 108 (90 Base) MCG/ACT inhaler Inhale 2 puffs into the lungs every 6 (six) hours as needed for wheezing or shortness of breath. Reported on 01/25/2016   year at Unknown time  . cyclobenzaprine (FLEXERIL) 10 MG tablet Take 1 tablet (10 mg total) by mouth 2 (two) times daily as needed for muscle spasms. 20 tablet 0 Past Week at Unknown time  . Prenatal Vit-Fe Fumarate-FA (PRENATAL MULTIVITAMIN) TABS tablet Take 1 tablet by mouth daily at 12 noon.   09/10/2016 at Unknown time  . ondansetron (ZOFRAN) 4 MG tablet Take 1 tablet (4 mg total) by mouth every 6 (six) hours. (Patient not taking: Reported on 09/10/2016) 12 tablet 0 Not Taking  . promethazine (PHENERGAN) 25 MG tablet Take 1 tablet (25 mg total) by mouth every 6 (six) hours as needed for nausea or vomiting (may use vaginally if necessary). (Patient not taking: Reported on 08/15/2016) 30 tablet 2 Not Taking    Review of Systems  Constitutional: Negative for fever.  HENT: Positive for congestion, rhinorrhea, sinus pain, sinus pressure and sore throat. Negative for ear pain.  Respiratory: Positive for cough. Negative for shortness of breath.   Cardiovascular: Negative for chest pain.  Gastrointestinal: Positive for abdominal pain. Negative for constipation, diarrhea, nausea and vomiting.  Genitourinary: Negative for vaginal bleeding and vaginal discharge.   Physical Exam   Blood pressure 112/73, pulse 83, temperature 98.5 F (36.9 C), temperature source Oral, resp. rate 16, weight 182 lb (82.6 kg), last menstrual period 04/21/2016, SpO2 100 %.  Physical Exam  Nursing note and vitals reviewed. Constitutional: She is oriented to person, place, and time. She appears well-developed and well-nourished. No distress.  HENT:  Head: Normocephalic and atraumatic.  Right Ear: Tympanic membrane, external ear and ear canal normal.  Left Ear: Tympanic membrane, external ear and ear canal normal.  Nose: Rhinorrhea present. No mucosal edema. Right sinus  exhibits frontal sinus tenderness. Right sinus exhibits no maxillary sinus tenderness. Left sinus exhibits frontal sinus tenderness. Left sinus exhibits no maxillary sinus tenderness.  Mouth/Throat: Mucous membranes are normal. Posterior oropharyngeal erythema (very mild) present. No oropharyngeal exudate, posterior oropharyngeal edema or tonsillar abscesses.  Cardiovascular: Normal rate, regular rhythm and normal heart sounds.   Respiratory: Effort normal and breath sounds normal. No respiratory distress. She has no wheezes. She has no rales.  GI: Soft. She exhibits no distension and no mass. There is no tenderness. There is no rebound and no guarding.  Lymphadenopathy:       Head (right side): No submental, no submandibular and no tonsillar adenopathy present.       Head (left side): No submental, no submandibular and no tonsillar adenopathy present.    She has no cervical adenopathy.  Neurological: She is alert and oriented to person, place, and time.  Skin: Skin is warm and dry. No erythema.  Psychiatric: She has a normal mood and affect.    Results for orders placed or performed during the hospital encounter of 09/11/16 (from the past 24 hour(s))  Urinalysis, Routine w reflex microscopic     Status: None   Collection Time: 09/11/16 11:23 AM  Result Value Ref Range   Color, Urine YELLOW YELLOW   APPearance CLEAR CLEAR   Specific Gravity, Urine 1.011 1.005 - 1.030   pH 7.0 5.0 - 8.0   Glucose, UA NEGATIVE NEGATIVE mg/dL   Hgb urine dipstick NEGATIVE NEGATIVE   Bilirubin Urine NEGATIVE NEGATIVE   Ketones, ur NEGATIVE NEGATIVE mg/dL   Protein, ur NEGATIVE NEGATIVE mg/dL   Nitrite NEGATIVE NEGATIVE   Leukocytes, UA NEGATIVE NEGATIVE    MAU Course  Procedures None  MDM FHR - 131 bpm UA today  Patient is afebrile and symptoms present x 3 days only, presumed viral at this time. No need for antibiotics, discussed this with the patient.  Assessment and Plan  A: SIUP at  [redacted]w[redacted]d Viral URI   P: Discharge home List of OTC medications safe in pregnancy given with recommendations for symptomatic relief Warning signs for worsening condition discussed Patient advised to follow-up with [facility] as scheduled for routine prenatal care or sooner PRN Patient may return to MAU as needed or if her condition were to change or worsen   Marny Lowenstein, PA-C  09/11/2016, 12:13 PM

## 2016-09-11 NOTE — MAU Note (Signed)
Head aches (frontal), coughing, nose is draining back into throat .  Pulling sensation in abd, really feels tired

## 2016-09-12 ENCOUNTER — Telehealth: Payer: Self-pay | Admitting: *Deleted

## 2016-09-12 NOTE — Telephone Encounter (Signed)
Pt left message stating that she is [redacted] wks pregnant and was seen @ MAU yesterday.  She was given a list of medications she could take for a cold and wants to ask a question. I called pt back and answered her questions regarding Tylenol and compound Tylenol products to her satisfaction.

## 2016-09-14 ENCOUNTER — Other Ambulatory Visit: Payer: Medicaid Other

## 2016-09-14 DIAGNOSIS — O0992 Supervision of high risk pregnancy, unspecified, second trimester: Secondary | ICD-10-CM

## 2016-09-15 LAB — GLUCOSE TOLERANCE, 2 HOURS W/ 1HR
GLUCOSE, 1 HOUR: 124 mg/dL (ref 65–179)
GLUCOSE, 2 HOUR: 119 mg/dL (ref 65–152)
Glucose, Fasting: 77 mg/dL (ref 65–91)

## 2016-09-26 ENCOUNTER — Telehealth: Payer: Self-pay | Admitting: *Deleted

## 2016-09-26 LAB — GLUCOSE TOLERANCE, 2 HOURS: GLUCOSE FASTING GTT: 77 mg/dL (ref 65–99)

## 2016-09-26 LAB — AFP, SERUM, OPEN SPINA BIFIDA

## 2016-09-26 NOTE — Telephone Encounter (Signed)
Per Steward DroneBrenda with [facility] patients AFP/Quad was not done, the sample was accidentally dumped before performing the test. The patient will not be charged. Per [facility] test must be done between 15-23 weeks. Attempted to call patient to inform her and was unable to leave message.

## 2016-09-26 NOTE — Telephone Encounter (Signed)
Spoke with patient. She will come tomorrow at 1000 to have AFP drawn.

## 2016-09-27 ENCOUNTER — Other Ambulatory Visit: Payer: Medicaid Other

## 2016-09-27 DIAGNOSIS — Z3492 Encounter for supervision of normal pregnancy, unspecified, second trimester: Secondary | ICD-10-CM

## 2016-09-27 DIAGNOSIS — O0992 Supervision of high risk pregnancy, unspecified, second trimester: Secondary | ICD-10-CM

## 2016-10-07 LAB — AFP, QUAD SCREEN
DIA VALUE (EIA): 203.52 pg/mL
DSR (By Age)    1 IN: 405
GESTATIONAL AGE AFP: 22.7 wk
MATERNAL AGE AT EDD: 33.5 a
MSAFP MOM: 0.81
MSAFP: 63.6 ng/mL
MSHCG: 15949 m[IU]/mL
OSB RISK: 10000
T18 (By Age): 1:1576 {titer}
WEIGHT: 180 [lb_av]
uE3 Value: 2.84 ng/mL

## 2016-10-15 ENCOUNTER — Telehealth: Payer: Self-pay | Admitting: *Deleted

## 2016-10-15 NOTE — Telephone Encounter (Signed)
Call reports from [employer]:  Patient had elevated BP email reading over the week end: 128/90 with headache - second check 112/79

## 2016-10-16 ENCOUNTER — Telehealth: Payer: Self-pay | Admitting: *Deleted

## 2016-10-16 NOTE — Telephone Encounter (Signed)
Received message from [employer] yesterday that pt had 1 elevated BP (128/90) on 3/24, repeat value 30 minutes later was 112/79. I called pt to discuss her BP readings from 3/24. She stated that she was not feeling well at the time and had just woken up. She denies having a headache at the time BP reading was 128/90. The [employer] question she answered yes to was 'Have you been experiencing headaches ?' She stated that she does have headaches from time to time but again was not having one at the time of BP reading. Pt has been feeling well since 3/24. I advised pt to continue checking BP weekly as instructed from [employer]. She should be sure to sit for 5 minutes prior to taking the BP. Reasons to come to hospital reviewed and pt voiced understanding of all information and instructions given.

## 2016-11-01 ENCOUNTER — Telehealth: Payer: Self-pay

## 2016-11-01 NOTE — Telephone Encounter (Signed)
Patient is on [employer] called her in regards to her not checking her blood pressure. Patient stated she has been busy but will try and do a better job of checking. I have voice the importance of patient taking her blood pressure at once per day.She has voice understanding at this time.

## 2016-11-09 ENCOUNTER — Ambulatory Visit (INDEPENDENT_AMBULATORY_CARE_PROVIDER_SITE_OTHER): Payer: Medicaid Other | Admitting: Obstetrics & Gynecology

## 2016-11-09 VITALS — BP 127/73 | HR 90 | Wt 197.7 lb

## 2016-11-09 DIAGNOSIS — Z3480 Encounter for supervision of other normal pregnancy, unspecified trimester: Secondary | ICD-10-CM

## 2016-11-09 NOTE — Patient Instructions (Signed)
Third Trimester of Pregnancy The third trimester is from week 28 through week 40 (months 7 through 9). The third trimester is a time when the unborn baby (fetus) is growing rapidly. At the end of the ninth month, the fetus is about 20 inches in length and weighs 6-10 pounds. Body changes during your third trimester Your body will continue to go through many changes during pregnancy. The changes vary from woman to woman. During the third trimester:  Your weight will continue to increase. You can expect to gain 25-35 pounds (11-16 kg) by the end of the pregnancy.  You may begin to get stretch marks on your hips, abdomen, and breasts.  You may urinate more often because the fetus is moving lower into your pelvis and pressing on your bladder.  You may develop or continue to have heartburn. This is caused by increased hormones that slow down muscles in the digestive tract.  You may develop or continue to have constipation because increased hormones slow digestion and cause the muscles that push waste through your intestines to relax.  You may develop hemorrhoids. These are swollen veins (varicose veins) in the rectum that can itch or be painful.  You may develop swollen, bulging veins (varicose veins) in your legs.  You may have increased body aches in the pelvis, back, or thighs. This is due to weight gain and increased hormones that are relaxing your joints.  You may have changes in your hair. These can include thickening of your hair, rapid growth, and changes in texture. Some women also have hair loss during or after pregnancy, or hair that feels dry or thin. Your hair will most likely return to normal after your baby is born.  Your breasts will continue to grow and they will continue to become tender. A yellow fluid (colostrum) may leak from your breasts. This is the first milk you are producing for your baby.  Your belly button may stick out.  You may notice more swelling in your hands,  face, or ankles.  You may have increased tingling or numbness in your hands, arms, and legs. The skin on your belly may also feel numb.  You may feel short of breath because of your expanding uterus.  You may have more problems sleeping. This can be caused by the size of your belly, increased need to urinate, and an increase in your body's metabolism.  You may notice the fetus "dropping," or moving lower in your abdomen (lightening).  You may have increased vaginal discharge.  You may notice your joints feel loose and you may have pain around your pelvic bone.  What to expect at prenatal visits You will have prenatal exams every 2 weeks until week 36. Then you will have weekly prenatal exams. During a routine prenatal visit:  You will be weighed to make sure you and the baby are growing normally.  Your blood pressure will be taken.  Your abdomen will be measured to track your baby's growth.  The fetal heartbeat will be listened to.  Any test results from the previous visit will be discussed.  You may have a cervical check near your due date to see if your cervix has softened or thinned (effaced).  You will be tested for Group B streptococcus. This happens between 35 and 37 weeks.  Your health care provider may ask you:  What your birth plan is.  How you are feeling.  If you are feeling the baby move.  If you have had   any abnormal symptoms, such as leaking fluid, bleeding, severe headaches, or abdominal cramping.  If you are using any tobacco products, including cigarettes, chewing tobacco, and electronic cigarettes.  If you have any questions.  Other tests or screenings that may be performed during your third trimester include:  Blood tests that check for low iron levels (anemia).  Fetal testing to check the health, activity level, and growth of the fetus. Testing is done if you have certain medical conditions or if there are problems during the  pregnancy.  Nonstress test (NST). This test checks the health of your baby to make sure there are no signs of problems, such as the baby not getting enough oxygen. During this test, a belt is placed around your belly. The baby is made to move, and its heart rate is monitored during movement.  What is false labor? False labor is a condition in which you feel small, irregular tightenings of the muscles in the womb (contractions) that usually go away with rest, changing position, or drinking water. These are called Braxton Hicks contractions. Contractions may last for hours, days, or even weeks before true labor sets in. If contractions come at regular intervals, become more frequent, increase in intensity, or become painful, you should see your health care provider. What are the signs of labor?  Abdominal cramps.  Regular contractions that start at 10 minutes apart and become stronger and more frequent with time.  Contractions that start on the top of the uterus and spread down to the lower abdomen and back.  Increased pelvic pressure and dull back pain.  A watery or bloody mucus discharge that comes from the vagina.  Leaking of amniotic fluid. This is also known as your "water breaking." It could be a slow trickle or a gush. Let your health care provider know if it has a color or strange odor. If you have any of these signs, call your health care provider right away, even if it is before your due date. Follow these instructions at home: Medicines  Follow your health care provider's instructions regarding medicine use. Specific medicines may be either safe or unsafe to take during pregnancy.  Take a prenatal vitamin that contains at least 600 micrograms (mcg) of folic acid.  If you develop constipation, try taking a stool softener if your health care provider approves. Eating and drinking  Eat a balanced diet that includes fresh fruits and vegetables, whole grains, good sources of protein  such as meat, eggs, or tofu, and low-fat dairy. Your health care provider will help you determine the amount of weight gain that is right for you.  Avoid raw meat and uncooked cheese. These carry germs that can cause birth defects in the baby.  If you have low calcium intake from food, talk to your health care provider about whether you should take a daily calcium supplement.  Eat four or five small meals rather than three large meals a day.  Limit foods that are high in fat and processed sugars, such as fried and sweet foods.  To prevent constipation: ? Drink enough fluid to keep your urine clear or pale yellow. ? Eat foods that are high in fiber, such as fresh fruits and vegetables, whole grains, and beans. Activity  Exercise only as directed by your health care provider. Most women can continue their usual exercise routine during pregnancy. Try to exercise for 30 minutes at least 5 days a week. Stop exercising if you experience uterine contractions.  Avoid heavy   lifting.  Do not exercise in extreme heat or humidity, or at high altitudes.  Wear low-heel, comfortable shoes.  Practice good posture.  You may continue to have sex unless your health care provider tells you otherwise. Relieving pain and discomfort  Take frequent breaks and rest with your legs elevated if you have leg cramps or low back pain.  Take warm sitz baths to soothe any pain or discomfort caused by hemorrhoids. Use hemorrhoid cream if your health care provider approves.  Wear a good support bra to prevent discomfort from breast tenderness.  If you develop varicose veins: ? Wear support pantyhose or compression stockings as told by your healthcare provider. ? Elevate your feet for 15 minutes, 3-4 times a day. Prenatal care  Write down your questions. Take them to your prenatal visits.  Keep all your prenatal visits as told by your health care provider. This is important. Safety  Wear your seat belt at  all times when driving.  Make a list of emergency phone numbers, including numbers for family, friends, the hospital, and police and fire departments. General instructions  Avoid cat litter boxes and soil used by cats. These carry germs that can cause birth defects in the baby. If you have a cat, ask someone to clean the litter box for you.  Do not travel far distances unless it is absolutely necessary and only with the approval of your health care provider.  Do not use hot tubs, steam rooms, or saunas.  Do not drink alcohol.  Do not use any products that contain nicotine or tobacco, such as cigarettes and e-cigarettes. If you need help quitting, ask your health care provider.  Do not use any medicinal herbs or unprescribed drugs. These chemicals affect the formation and growth of the baby.  Do not douche or use tampons or scented sanitary pads.  Do not cross your legs for long periods of time.  To prepare for the arrival of your baby: ? Take prenatal classes to understand, practice, and ask questions about labor and delivery. ? Make a trial run to the hospital. ? Visit the hospital and tour the maternity area. ? Arrange for maternity or paternity leave through employers. ? Arrange for family and friends to take care of pets while you are in the hospital. ? Purchase a rear-facing car seat and make sure you know how to install it in your car. ? Pack your hospital bag. ? Prepare the baby's nursery. Make sure to remove all pillows and stuffed animals from the baby's crib to prevent suffocation.  Visit your dentist if you have not gone during your pregnancy. Use a soft toothbrush to brush your teeth and be gentle when you floss. Contact a health care provider if:  You are unsure if you are in labor or if your water has broken.  You become dizzy.  You have mild pelvic cramps, pelvic pressure, or nagging pain in your abdominal area.  You have lower back pain.  You have persistent  nausea, vomiting, or diarrhea.  You have an unusual or bad smelling vaginal discharge.  You have pain when you urinate. Get help right away if:  Your water breaks before 37 weeks.  You have regular contractions less than 5 minutes apart before 37 weeks.  You have a fever.  You are leaking fluid from your vagina.  You have spotting or bleeding from your vagina.  You have severe abdominal pain or cramping.  You have rapid weight loss or weight gain.    You have shortness of breath with chest pain.  You notice sudden or extreme swelling of your face, hands, ankles, feet, or legs.  Your baby makes fewer than 10 movements in 2 hours.  You have severe headaches that do not go away when you take medicine.  You have vision changes. Summary  The third trimester is from week 28 through week 40, months 7 through 9. The third trimester is a time when the unborn baby (fetus) is growing rapidly.  During the third trimester, your discomfort may increase as you and your baby continue to gain weight. You may have abdominal, leg, and back pain, sleeping problems, and an increased need to urinate.  During the third trimester your breasts will keep growing and they will continue to become tender. A yellow fluid (colostrum) may leak from your breasts. This is the first milk you are producing for your baby.  False labor is a condition in which you feel small, irregular tightenings of the muscles in the womb (contractions) that eventually go away. These are called Braxton Hicks contractions. Contractions may last for hours, days, or even weeks before true labor sets in.  Signs of labor can include: abdominal cramps; regular contractions that start at 10 minutes apart and become stronger and more frequent with time; watery or bloody mucus discharge that comes from the vagina; increased pelvic pressure and dull back pain; and leaking of amniotic fluid. This information is not intended to replace advice  given to you by your health care provider. Make sure you discuss any questions you have with your health care provider. Document Released: 07/03/2001 Document Revised: 12/15/2015 Document Reviewed: 09/09/2012 Elsevier Interactive Patient Education  2017 [employer]  

## 2016-11-09 NOTE — Progress Notes (Signed)
Wants to be taken out of [facility]. c/o contractions everyday- at most 3 per day.

## 2016-11-09 NOTE — Progress Notes (Signed)
   PRENATAL VISIT NOTE  Subjective:  Julia Bautista is a 36 y.o. G7P1051 at [redacted]w[redacted]d being seen today for ongoing prenatal care.  She is currently monitored for the following issues for this low-risk pregnancy and has History of abnormal cervical Pap smear; Supervision of normal pregnancy; history of Herpes; History of loop electrical excision procedure (LEEP); and History of cesarean delivery on her problem list.  Patient reports occasional contractions.  Contractions: Irregular. Vag. Bleeding: None.  Movement: Present. Denies leaking of fluid.   The following portions of the patient's history were reviewed and updated as appropriate: allergies, current medications, past family history, past medical history, past social history, past surgical history and problem list. Problem list updated.  Objective:   Vitals:   11/09/16 0949  BP: 127/73  Pulse: 90  Weight: 197 lb 11.2 oz (89.7 kg)    Fetal Status: Fetal Heart Rate (bpm): 128   Movement: Present     General:  Alert, oriented and cooperative. Patient is in no acute distress.  Skin: Skin is warm and dry. No rash noted.   Cardiovascular: Normal heart rate noted  Respiratory: Normal respiratory effort, no problems with respiration noted  Abdomen: Soft, gravid, appropriate for gestational age. Pain/Pressure: Absent     Pelvic:  Cervical exam deferred        Extremities: Normal range of motion.  Edema: Trace  Mental Status: Normal mood and affect. Normal behavior. Normal judgment and thought content.   Assessment and Plan:  Pregnancy: G7P1051 at [redacted]w[redacted]d  1. Supervision of other normal pregnancy, antepartum Doing well and withdraw from [facility]  Preterm labor symptoms and general obstetric precautions including but not limited to vaginal bleeding, contractions, leaking of fluid and fetal movement were reviewed in detail with the patient. Please refer to After Visit Summary for other counseling recommendations.  Return in about 2 weeks  (around 11/23/2016). 2 hr GTT asap  Adam Phenix, MD

## 2016-11-09 NOTE — Progress Notes (Signed)
  Vitals signs from [facility].

## 2016-11-12 ENCOUNTER — Other Ambulatory Visit: Payer: Medicaid Other

## 2016-11-12 DIAGNOSIS — Z3480 Encounter for supervision of other normal pregnancy, unspecified trimester: Secondary | ICD-10-CM

## 2016-11-13 LAB — GLUCOSE TOLERANCE, 2 HOURS W/ 1HR
GLUCOSE, 2 HOUR: 113 mg/dL (ref 65–152)
Glucose, 1 hour: 124 mg/dL (ref 65–179)
Glucose, Fasting: 78 mg/dL (ref 65–91)

## 2016-11-23 ENCOUNTER — Ambulatory Visit (INDEPENDENT_AMBULATORY_CARE_PROVIDER_SITE_OTHER): Payer: Medicaid Other | Admitting: Obstetrics and Gynecology

## 2016-11-23 VITALS — BP 118/74 | HR 80 | Wt 197.0 lb

## 2016-11-23 DIAGNOSIS — Z87898 Personal history of other specified conditions: Secondary | ICD-10-CM

## 2016-11-23 DIAGNOSIS — Z98891 History of uterine scar from previous surgery: Secondary | ICD-10-CM

## 2016-11-23 DIAGNOSIS — Z3483 Encounter for supervision of other normal pregnancy, third trimester: Secondary | ICD-10-CM

## 2016-11-23 DIAGNOSIS — Z8742 Personal history of other diseases of the female genital tract: Secondary | ICD-10-CM

## 2016-11-23 DIAGNOSIS — B009 Herpesviral infection, unspecified: Secondary | ICD-10-CM

## 2016-11-23 NOTE — Progress Notes (Deleted)
   PRENATAL VISIT NOTE  Subjective:  Julia DowdySophia L Bautista is a 36 y.o. G7P1051 at 4753w6d being seen today for ongoing prenatal care.  She is currently monitored for the following issues for this low-risk pregnancy and has History of abnormal cervical Pap smear; Supervision of normal pregnancy; history of Herpes; History of loop electrical excision procedure (LEEP); and History of cesarean delivery on her problem list.  Patient reports no complaints.  Contractions: Irregular. Vag. Bleeding: None.  Movement: Present. Denies leaking of fluid.   The following portions of the patient's history were reviewed and updated as appropriate: allergies, current medications, past family history, past medical history, past social history, past surgical history and problem list. Problem list updated.  Objective:   Vitals:   11/23/16 0843  BP: 118/74  Pulse: 80  Weight: 197 lb (89.4 kg)    Fetal Status: Fetal Heart Rate (bpm): 140   Movement: Present     General:  Alert, oriented and cooperative. Patient is in no acute distress.  Skin: Skin is warm and dry. No rash noted.   Cardiovascular: Normal heart rate noted  Respiratory: Normal respiratory effort, no problems with respiration noted  Abdomen: Soft, gravid, appropriate for gestational age. Pain/Pressure: Present     Pelvic:  Cervical exam deferred        Extremities: Normal range of motion.  Edema: Trace  Mental Status: Normal mood and affect. Normal behavior. Normal judgment and thought content.   Assessment and Plan:  Pregnancy: [id number] at 6253w6d  1. History of cesarean delivery ***  2. History of abnormal cervical Pap smear ***  3. Encounter for supervision of other normal pregnancy in third trimester ***  4. history of Herpes ***  {Blank single:19197::"Term","Preterm"} labor symptoms and general obstetric precautions including but not limited to vaginal bleeding, contractions, leaking of fluid and fetal movement were reviewed in detail  with the patient. Please refer to After Visit Summary for other counseling recommendations.  Return in about 2 weeks (around 12/07/2016) for ROB.   Catalina AntiguaPeggy Ndea Kilroy, MD

## 2016-11-23 NOTE — Progress Notes (Signed)
   PRENATAL VISIT NOTE  Subjective:  Julia DowdySophia L Bautista is a [age] y.o. G7P1051 at 4725w6d being seen today for ongoing prenatal care.  She is currently monitored for the following issues for this low-risk pregnancy and has History of abnormal cervical Pap smear; Supervision of normal pregnancy; history of Herpes; History of loop electrical excision procedure (LEEP); and History of cesarean delivery on her problem list.  Patient reports no complaints.  Contractions: Irregular. Vag. Bleeding: None.  Movement: Present. Denies leaking of fluid.   The following portions of the patient's history were reviewed and updated as appropriate: allergies, current medications, past family history, past medical history, past social history, past surgical history and problem list. Problem list updated.  Objective:   Vitals:   11/23/16 0843  BP: 118/74  Pulse: 80  Weight: 197 lb (89.4 kg)    Fetal Status: Fetal Heart Rate (bpm): 140 Fundal Height: 30 cm Movement: Present     General:  Alert, oriented and cooperative. Patient is in no acute distress.  Skin: Skin is warm and dry. No rash noted.   Cardiovascular: Normal heart rate noted  Respiratory: Normal respiratory effort, no problems with respiration noted  Abdomen: Soft, gravid, appropriate for gestational age. Pain/Pressure: Present     Pelvic:  Cervical exam deferred        Extremities: Normal range of motion.  Edema: Trace  Mental Status: Normal mood and affect. Normal behavior. Normal judgment and thought content.   Assessment and Plan:  Pregnancy: [id number] at 1925w6d  1. History of cesarean delivery Patient desires repeat- will be scheduled at 39 weeks.  Patient informed of potential 6/30 c-section date. She desires to have her son born in July. Will try to schedule on 7/1 or 7/2  2. History of abnormal cervical Pap smear colpo neg in 2017- repeat pap oct 2018  3. Encounter for supervision of other normal pregnancy in third trimester Patient is  doing well without complaints Answered questions regarding circumcision  4. history of Herpes Valtrex at 35 weeks  Preterm labor symptoms and general obstetric precautions including but not limited to vaginal bleeding, contractions, leaking of fluid and fetal movement were reviewed in detail with the patient. Please refer to After Visit Summary for other counseling recommendations.  Return in about 2 weeks (around 12/07/2016) for ROB.   Catalina AntiguaPeggy Jaquan Sadowsky, MD

## 2016-11-29 ENCOUNTER — Encounter (HOSPITAL_COMMUNITY): Payer: Self-pay

## 2016-12-02 ENCOUNTER — Encounter (HOSPITAL_COMMUNITY): Payer: Self-pay | Admitting: Certified Nurse Midwife

## 2016-12-02 ENCOUNTER — Inpatient Hospital Stay (HOSPITAL_COMMUNITY)
Admission: AD | Admit: 2016-12-02 | Discharge: 2016-12-02 | Disposition: A | Discharge status: Home / Self Care | Payer: Medicaid Other | Source: Ambulatory Visit | Attending: Obstetrics & Gynecology | Admitting: Obstetrics & Gynecology

## 2016-12-02 DIAGNOSIS — Z3A32 32 weeks gestation of pregnancy: Secondary | ICD-10-CM | POA: Insufficient documentation

## 2016-12-02 DIAGNOSIS — Z3483 Encounter for supervision of other normal pregnancy, third trimester: Secondary | ICD-10-CM

## 2016-12-02 DIAGNOSIS — R609 Edema, unspecified: Secondary | ICD-10-CM | POA: Diagnosis not present

## 2016-12-02 DIAGNOSIS — O26893 Other specified pregnancy related conditions, third trimester: Secondary | ICD-10-CM | POA: Diagnosis present

## 2016-12-02 LAB — URINALYSIS, ROUTINE W REFLEX MICROSCOPIC
BILIRUBIN URINE: NEGATIVE
GLUCOSE, UA: NEGATIVE mg/dL
HGB URINE DIPSTICK: NEGATIVE
Ketones, ur: NEGATIVE mg/dL
NITRITE: NEGATIVE
PROTEIN: NEGATIVE mg/dL
Specific Gravity, Urine: 1.014 (ref 1.005–1.030)
pH: 7 (ref 5.0–8.0)

## 2016-12-02 NOTE — MAU Note (Signed)
Patient presents to [facility]U with c/o of swelling in hands and feet that has gotten worse over the last couple of days. Legs hurt when standing and walking. No history of blood pressure issue. No headache or visual disturbances. Denies LOF and VB. Denies contractions. +FM.

## 2016-12-02 NOTE — Discharge Instructions (Signed)
Third Trimester of Pregnancy The third trimester is from week 28 through week 40 (months 7 through 9). The third trimester is a time when the unborn baby (fetus) is growing rapidly. At the end of the ninth month, the fetus is about 20 inches in length and weighs 6-10 pounds. Body changes during your third trimester Your body will continue to go through many changes during pregnancy. The changes vary from woman to woman. During the third trimester:  Your weight will continue to increase. You can expect to gain 25-35 pounds (11-16 kg) by the end of the pregnancy.  You may begin to get stretch marks on your hips, abdomen, and breasts.  You may urinate more often because the fetus is moving lower into your pelvis and pressing on your bladder.  You may develop or continue to have heartburn. This is caused by increased hormones that slow down muscles in the digestive tract.  You may develop or continue to have constipation because increased hormones slow digestion and cause the muscles that push waste through your intestines to relax.  You may develop hemorrhoids. These are swollen veins (varicose veins) in the rectum that can itch or be painful.  You may develop swollen, bulging veins (varicose veins) in your legs.  You may have increased body aches in the pelvis, back, or thighs. This is due to weight gain and increased hormones that are relaxing your joints.  You may have changes in your hair. These can include thickening of your hair, rapid growth, and changes in texture. Some women also have hair loss during or after pregnancy, or hair that feels dry or thin. Your hair will most likely return to normal after your baby is born.  Your breasts will continue to grow and they will continue to become tender. A yellow fluid (colostrum) may leak from your breasts. This is the first milk you are producing for your baby.  Your belly button may stick out.  You may notice more swelling in your hands,  face, or ankles.  You may have increased tingling or numbness in your hands, arms, and legs. The skin on your belly may also feel numb.  You may feel short of breath because of your expanding uterus.  You may have more problems sleeping. This can be caused by the size of your belly, increased need to urinate, and an increase in your body's metabolism.  You may notice the fetus "dropping," or moving lower in your abdomen (lightening).  You may have increased vaginal discharge.  You may notice your joints feel loose and you may have pain around your pelvic bone. What to expect at prenatal visits You will have prenatal exams every 2 weeks until week 36. Then you will have weekly prenatal exams. During a routine prenatal visit:  You will be weighed to make sure you and the baby are growing normally.  Your blood pressure will be taken.  Your abdomen will be measured to track your baby's growth.  The fetal heartbeat will be listened to.  Any test results from the previous visit will be discussed.  You may have a cervical check near your due date to see if your cervix has softened or thinned (effaced).  You will be tested for Group B streptococcus. This happens between 35 and 37 weeks. Your health care provider may ask you:  What your birth plan is.  How you are feeling.  If you are feeling the baby move.  If you have had any abnormal  symptoms, such as leaking fluid, bleeding, severe headaches, or abdominal cramping.  If you are using any tobacco products, including cigarettes, chewing tobacco, and electronic cigarettes.  If you have any questions. Other tests or screenings that may be performed during your third trimester include:  Blood tests that check for low iron levels (anemia).  Fetal testing to check the health, activity level, and growth of the fetus. Testing is done if you have certain medical conditions or if there are problems during the pregnancy.  Nonstress test  (NST). This test checks the health of your baby to make sure there are no signs of problems, such as the baby not getting enough oxygen. During this test, a belt is placed around your belly. The baby is made to move, and its heart rate is monitored during movement. What is false labor? False labor is a condition in which you feel small, irregular tightenings of the muscles in the womb (contractions) that usually go away with rest, changing position, or drinking water. These are called Braxton Hicks contractions. Contractions may last for hours, days, or even weeks before true labor sets in. If contractions come at regular intervals, become more frequent, increase in intensity, or become painful, you should see your health care provider. What are the signs of labor?  Abdominal cramps.  Regular contractions that start at 10 minutes apart and become stronger and more frequent with time.  Contractions that start on the top of the uterus and spread down to the lower abdomen and back.  Increased pelvic pressure and dull back pain.  A watery or bloody mucus discharge that comes from the vagina.  Leaking of amniotic fluid. This is also known as your "water breaking." It could be a slow trickle or a gush. Let your health care provider know if it has a color or strange odor. If you have any of these signs, call your health care provider right away, even if it is before your due date. Follow these instructions at home: Medicines   Follow your health care provider's instructions regarding medicine use. Specific medicines may be either safe or unsafe to take during pregnancy.  Take a prenatal vitamin that contains at least 600 micrograms (mcg) of folic acid.  If you develop constipation, try taking a stool softener if your health care provider approves. Eating and drinking   Eat a balanced diet that includes fresh fruits and vegetables, whole grains, good sources of protein such as meat, eggs, or tofu,  and low-fat dairy. Your health care provider will help you determine the amount of weight gain that is right for you.  Avoid raw meat and uncooked cheese. These carry germs that can cause birth defects in the baby.  If you have low calcium intake from food, talk to your health care provider about whether you should take a daily calcium supplement.  Eat four or five small meals rather than three large meals a day.  Limit foods that are high in fat and processed sugars, such as fried and sweet foods.  To prevent constipation:  Drink enough fluid to keep your urine clear or pale yellow.  Eat foods that are high in fiber, such as fresh fruits and vegetables, whole grains, and beans. Activity   Exercise only as directed by your health care provider. Most women can continue their usual exercise routine during pregnancy. Try to exercise for 30 minutes at least 5 days a week. Stop exercising if you experience uterine contractions.  Avoid heavy lifting.  Do not exercise in extreme heat or humidity, or at high altitudes.  Wear low-heel, comfortable shoes.  Practice good posture.  You may continue to have sex unless your health care provider tells you otherwise. Relieving pain and discomfort   Take frequent breaks and rest with your legs elevated if you have leg cramps or low back pain.  Take warm sitz baths to soothe any pain or discomfort caused by hemorrhoids. Use hemorrhoid cream if your health care provider approves.  Wear a good support bra to prevent discomfort from breast tenderness.  If you develop varicose veins:  Wear support pantyhose or compression stockings as told by your healthcare provider.  Elevate your feet for 15 minutes, 3-4 times a day. Prenatal care   Write down your questions. Take them to your prenatal visits.  Keep all your prenatal visits as told by your health care provider. This is important. Safety   Wear your seat belt at all times when  driving.  Make a list of emergency phone numbers, including numbers for family, friends, the hospital, and police and fire departments. General instructions   Avoid cat litter boxes and soil used by cats. These carry germs that can cause birth defects in the baby. If you have a cat, ask someone to clean the litter box for you.  Do not travel far distances unless it is absolutely necessary and only with the approval of your health care provider.  Do not use hot tubs, steam rooms, or saunas.  Do not drink alcohol.  Do not use any products that contain nicotine or tobacco, such as cigarettes and e-cigarettes. If you need help quitting, ask your health care provider.  Do not use any medicinal herbs or unprescribed drugs. These chemicals affect the formation and growth of the baby.  Do not douche or use tampons or scented sanitary pads.  Do not cross your legs for long periods of time.  To prepare for the arrival of your baby:  Take prenatal classes to understand, practice, and ask questions about labor and delivery.  Make a trial run to the hospital.  Visit the hospital and tour the maternity area.  Arrange for maternity or paternity leave through employers.  Arrange for family and friends to take care of pets while you are in the hospital.  Purchase a rear-facing car seat and make sure you know how to install it in your car.  Pack your hospital bag.  Prepare the babys nursery. Make sure to remove all pillows and stuffed animals from the baby's crib to prevent suffocation.  Visit your dentist if you have not gone during your pregnancy. Use a soft toothbrush to brush your teeth and be gentle when you floss. Contact a health care provider if:  You are unsure if you are in labor or if your water has broken.  You become dizzy.  You have mild pelvic cramps, pelvic pressure, or nagging pain in your abdominal area.  You have lower back pain.  You have persistent nausea,  vomiting, or diarrhea.  You have an unusual or bad smelling vaginal discharge.  You have pain when you urinate. Get help right away if:  Your water breaks before 37 weeks.  You have regular contractions less than 5 minutes apart before 37 weeks.  You have a fever.  You are leaking fluid from your vagina.  You have spotting or bleeding from your vagina.  You have severe abdominal pain or cramping.  You have rapid weight loss or  weight gain.  You have shortness of breath with chest pain.  You notice sudden or extreme swelling of your face, hands, ankles, feet, or legs.  Your baby makes fewer than 10 movements in 2 hours.  You have severe headaches that do not go away when you take medicine.  You have vision changes. Summary  The third trimester is from week 28 through week 40, months 7 through 9. The third trimester is a time when the unborn baby (fetus) is growing rapidly.  During the third trimester, your discomfort may increase as you and your baby continue to gain weight. You may have abdominal, leg, and back pain, sleeping problems, and an increased need to urinate.  During the third trimester your breasts will keep growing and they will continue to become tender. A yellow fluid (colostrum) may leak from your breasts. This is the first milk you are producing for your baby.  False labor is a condition in which you feel small, irregular tightenings of the muscles in the womb (contractions) that eventually go away. These are called Braxton Hicks contractions. Contractions may last for hours, days, or even weeks before true labor sets in.  Signs of labor can include: abdominal cramps; regular contractions that start at 10 minutes apart and become stronger and more frequent with time; watery or bloody mucus discharge that comes from the vagina; increased pelvic pressure and dull back pain; and leaking of amniotic fluid. This information is not intended to replace advice given  to you by your health care provider. Make sure you discuss any questions you have with your health care provider. Document Released: 07/03/2001 Document Revised: 12/15/2015 Document Reviewed: 09/09/2012 Elsevier Interactive Patient Education  2017 [employer] Edema Edema is an abnormal buildup of fluids in your bodytissues. Edema is somewhatdependent on gravity to pull the fluid to the lowest place in your body. That makes the condition more common in the legs and thighs (lower extremities). Painless swelling of the feet and ankles is common and becomes more likely as you get older. It is also common in looser tissues, like around your eyes. When the affected area is squeezed, the fluid may move out of that spot and leave a dent for a few moments. This dent is called pitting. What are the causes? There are many possible causes of edema. Eating too much salt and being on your feet or sitting for a long time can cause edema in your legs and ankles. Hot weather may make edema worse. Common medical causes of edema include:  Heart failure.  Liver disease.  Kidney disease.  Weak blood vessels in your legs.  Cancer.  An injury.  Pregnancy.  Some medications.  Obesity. What are the signs or symptoms? Edema is usually painless.Your skin may look swollen or shiny. How is this diagnosed? Your health care provider may be able to diagnose edema by asking about your medical history and doing a physical exam. You may need to have tests such as X-rays, an electrocardiogram, or blood tests to check for medical conditions that may cause edema. How is this treated? Edema treatment depends on the cause. If you have heart, liver, or kidney disease, you need the treatment appropriate for these conditions. General treatment may include:  Elevation of the affected body part above the level of your heart.  Compression of the affected body part. Pressure from elastic bandages or support stockings  squeezes the tissues and forces fluid back into the blood vessels. This keeps fluid from  entering the tissues.  Restriction of fluid and salt intake.  Use of a water pill (diuretic). These medications are appropriate only for some types of edema. They pull fluid out of your body and make you urinate more often. This gets rid of fluid and reduces swelling, but diuretics can have side effects. Only use diuretics as directed by your health care provider. Follow these instructions at home:  Keep the affected body part above the level of your heart when you are lying down.  Do not sit still or stand for prolonged periods.  Do not put anything directly under your knees when lying down.  Do not wear constricting clothing or garters on your upper legs.  Exercise your legs to work the fluid back into your blood vessels. This may help the swelling go down.  Wear elastic bandages or support stockings to reduce ankle swelling as directed by your health care provider.  Eat a low-salt diet to reduce fluid if your health care provider recommends it.  Only take medicines as directed by your health care provider. Contact a health care provider if:  Your edema is not responding to treatment.  You have heart, liver, or kidney disease and notice symptoms of edema.  You have edema in your legs that does not improve after elevating them.  You have sudden and unexplained weight gain. Get help right away if:  You develop shortness of breath or chest pain.  You cannot breathe when you lie down.  You develop pain, redness, or warmth in the swollen areas.  You have heart, liver, or kidney disease and suddenly get edema.  You have a fever and your symptoms suddenly get worse. This information is not intended to replace advice given to you by your health care provider. Make sure you discuss any questions you have with your health care provider. Document Released: 07/09/2005 Document Revised: 12/15/2015  Document Reviewed: 05/01/2013 Elsevier Interactive Patient Education  2017 [employer].

## 2016-12-04 ENCOUNTER — Encounter: Payer: Self-pay | Admitting: *Deleted

## 2016-12-06 ENCOUNTER — Ambulatory Visit (INDEPENDENT_AMBULATORY_CARE_PROVIDER_SITE_OTHER): Payer: Medicaid Other | Admitting: Obstetrics & Gynecology

## 2016-12-06 ENCOUNTER — Other Ambulatory Visit (HOSPITAL_COMMUNITY)
Admission: RE | Admit: 2016-12-06 | Discharge: 2016-12-06 | Disposition: A | Discharge status: Home / Self Care | Payer: Medicaid Other | Source: Ambulatory Visit | Attending: Obstetrics & Gynecology | Admitting: Obstetrics & Gynecology

## 2016-12-06 VITALS — BP 127/86 | HR 84 | Wt 204.0 lb

## 2016-12-06 DIAGNOSIS — Z3483 Encounter for supervision of other normal pregnancy, third trimester: Secondary | ICD-10-CM | POA: Diagnosis not present

## 2016-12-06 DIAGNOSIS — N898 Other specified noninflammatory disorders of vagina: Secondary | ICD-10-CM | POA: Insufficient documentation

## 2016-12-06 DIAGNOSIS — Z8742 Personal history of other diseases of the female genital tract: Secondary | ICD-10-CM

## 2016-12-06 DIAGNOSIS — Z3A00 Weeks of gestation of pregnancy not specified: Secondary | ICD-10-CM | POA: Insufficient documentation

## 2016-12-06 DIAGNOSIS — O26893 Other specified pregnancy related conditions, third trimester: Secondary | ICD-10-CM

## 2016-12-06 DIAGNOSIS — Z113 Encounter for screening for infections with a predominantly sexual mode of transmission: Secondary | ICD-10-CM

## 2016-12-06 DIAGNOSIS — Z87898 Personal history of other specified conditions: Secondary | ICD-10-CM

## 2016-12-06 NOTE — Progress Notes (Signed)
States went to [facility] Sunday for c/o increased swelling ankles/feet. C/o sometimes has discharge like water- panties get wet.

## 2016-12-06 NOTE — Patient Instructions (Signed)

## 2016-12-06 NOTE — Progress Notes (Signed)
   PRENATAL VISIT NOTE  Subjective:  Julia Bautista is a 36 y.o. [id number] at [redacted]w[redacted]d being seen today for ongoing prenatal care.  She is currently monitored for the following issues for this low-risk pregnancy and has History of abnormal cervical Pap smear; Supervision of normal pregnancy; history of Herpes; History of loop electrical excision procedure (LEEP); and History of cesarean delivery on her problem list.  Patient reports occasional contractions and vaginal discharge.  Contractions: Irregular. Vag. Bleeding: None.  Movement: Present. Denies leaking of fluid.   The following portions of the patient's history were reviewed and updated as appropriate: allergies, current medications, past family history, past medical history, past social history, past surgical history and problem list. Problem list updated.  Objective:   Vitals:   12/06/16 1526  BP: 127/86  Pulse: 84  Weight: 204 lb (92.5 kg)    Fetal Status: Fetal Heart Rate (bpm): 140   Movement: Present     General:  Alert, oriented and cooperative. Patient is in no acute distress.  Skin: Skin is warm and dry. No rash noted.   Cardiovascular: Normal heart rate noted  Respiratory: Normal respiratory effort, no problems with respiration noted  Abdomen: Soft, gravid, appropriate for gestational age. Pain/Pressure: Present     Pelvic:  Cervical exam performed        Extremities: Normal range of motion.  Edema: Trace  Mental Status: Normal mood and affect. Normal behavior. Normal judgment and thought content.   Assessment and Plan:  Pregnancy: [id number] at [redacted]w[redacted]d  1. History of abnormal cervical Pap smear   2. Encounter for supervision of other normal pregnancy in third trimester Vaginal white discharge no pool, no ROM - Cervicovaginal ancillary only  3. Vaginal discharge during pregnancy in third trimester F/u result - Cervicovaginal ancillary only  Preterm labor symptoms and general obstetric precautions including but not  limited to vaginal bleeding, contractions, leaking of fluid and fetal movement were reviewed in detail with the patient. Please refer to After Visit Summary for other counseling recommendations.  Return in about 2 weeks (around 12/20/2016).   Scheryl DarterJames Brodie Scovell, MD

## 2016-12-07 LAB — CERVICOVAGINAL ANCILLARY ONLY
Bacterial vaginitis: NEGATIVE
Candida vaginitis: NEGATIVE
Chlamydia: NEGATIVE
Neisseria Gonorrhea: NEGATIVE
Trichomonas: NEGATIVE

## 2016-12-12 ENCOUNTER — Telehealth: Payer: Self-pay

## 2016-12-12 NOTE — Telephone Encounter (Signed)
Patient has been informed of test results.  

## 2016-12-12 NOTE — Telephone Encounter (Signed)
-----   Message from Adam PhenixJames G Arnold, MD sent at 12/12/2016 10:43 AM EDT ----- Normal wet prep

## 2016-12-20 ENCOUNTER — Ambulatory Visit (INDEPENDENT_AMBULATORY_CARE_PROVIDER_SITE_OTHER): Payer: Medicaid Other | Admitting: Obstetrics and Gynecology

## 2016-12-20 VITALS — BP 118/83 | Wt 205.5 lb

## 2016-12-20 DIAGNOSIS — Z3490 Encounter for supervision of normal pregnancy, unspecified, unspecified trimester: Secondary | ICD-10-CM

## 2016-12-20 DIAGNOSIS — Z3483 Encounter for supervision of other normal pregnancy, third trimester: Secondary | ICD-10-CM

## 2016-12-20 DIAGNOSIS — B009 Herpesviral infection, unspecified: Secondary | ICD-10-CM

## 2016-12-20 MED ORDER — VALACYCLOVIR HCL 500 MG PO TABS: 500.0000 mg | ORAL_TABLET | Freq: Every day | ORAL | 0 refills | Status: AC

## 2016-12-20 NOTE — Patient Instructions (Signed)
Third Trimester of Pregnancy The third trimester is from week 28 through week 40 (months 7 through 9). The third trimester is a time when the unborn baby (fetus) is growing rapidly. At the end of the ninth month, the fetus is about 20 inches in length and weighs 6-10 pounds. Body changes during your third trimester Your body will continue to go through many changes during pregnancy. The changes vary from woman to woman. During the third trimester:  Your weight will continue to increase. You can expect to gain 25-35 pounds (11-16 kg) by the end of the pregnancy.  You may begin to get stretch marks on your hips, abdomen, and breasts.  You may urinate more often because the fetus is moving lower into your pelvis and pressing on your bladder.  You may develop or continue to have heartburn. This is caused by increased hormones that slow down muscles in the digestive tract.  You may develop or continue to have constipation because increased hormones slow digestion and cause the muscles that push waste through your intestines to relax.  You may develop hemorrhoids. These are swollen veins (varicose veins) in the rectum that can itch or be painful.  You may develop swollen, bulging veins (varicose veins) in your legs.  You may have increased body aches in the pelvis, back, or thighs. This is due to weight gain and increased hormones that are relaxing your joints.  You may have changes in your hair. These can include thickening of your hair, rapid growth, and changes in texture. Some women also have hair loss during or after pregnancy, or hair that feels dry or thin. Your hair will most likely return to normal after your baby is born.  Your breasts will continue to grow and they will continue to become tender. A yellow fluid (colostrum) may leak from your breasts. This is the first milk you are producing for your baby.  Your belly button may stick out.  You may notice more swelling in your hands,  face, or ankles.  You may have increased tingling or numbness in your hands, arms, and legs. The skin on your belly may also feel numb.  You may feel short of breath because of your expanding uterus.  You may have more problems sleeping. This can be caused by the size of your belly, increased need to urinate, and an increase in your body's metabolism.  You may notice the fetus "dropping," or moving lower in your abdomen (lightening).  You may have increased vaginal discharge.  You may notice your joints feel loose and you may have pain around your pelvic bone.  What to expect at prenatal visits You will have prenatal exams every 2 weeks until week 36. Then you will have weekly prenatal exams. During a routine prenatal visit:  You will be weighed to make sure you and the baby are growing normally.  Your blood pressure will be taken.  Your abdomen will be measured to track your baby's growth.  The fetal heartbeat will be listened to.  Any test results from the previous visit will be discussed.  You may have a cervical check near your due date to see if your cervix has softened or thinned (effaced).  You will be tested for Group B streptococcus. This happens between 35 and 37 weeks.  Your health care provider may ask you:  What your birth plan is.  How you are feeling.  If you are feeling the baby move.  If you have had   any abnormal symptoms, such as leaking fluid, bleeding, severe headaches, or abdominal cramping.  If you are using any tobacco products, including cigarettes, chewing tobacco, and electronic cigarettes.  If you have any questions.  Other tests or screenings that may be performed during your third trimester include:  Blood tests that check for low iron levels (anemia).  Fetal testing to check the health, activity level, and growth of the fetus. Testing is done if you have certain medical conditions or if there are problems during the  pregnancy.  Nonstress test (NST). This test checks the health of your baby to make sure there are no signs of problems, such as the baby not getting enough oxygen. During this test, a belt is placed around your belly. The baby is made to move, and its heart rate is monitored during movement.  What is false labor? False labor is a condition in which you feel small, irregular tightenings of the muscles in the womb (contractions) that usually go away with rest, changing position, or drinking water. These are called Braxton Hicks contractions. Contractions may last for hours, days, or even weeks before true labor sets in. If contractions come at regular intervals, become more frequent, increase in intensity, or become painful, you should see your health care provider. What are the signs of labor?  Abdominal cramps.  Regular contractions that start at 10 minutes apart and become stronger and more frequent with time.  Contractions that start on the top of the uterus and spread down to the lower abdomen and back.  Increased pelvic pressure and dull back pain.  A watery or bloody mucus discharge that comes from the vagina.  Leaking of amniotic fluid. This is also known as your "water breaking." It could be a slow trickle or a gush. Let your health care provider know if it has a color or strange odor. If you have any of these signs, call your health care provider right away, even if it is before your due date. Follow these instructions at home: Medicines  Follow your health care provider's instructions regarding medicine use. Specific medicines may be either safe or unsafe to take during pregnancy.  Take a prenatal vitamin that contains at least 600 micrograms (mcg) of folic acid.  If you develop constipation, try taking a stool softener if your health care provider approves. Eating and drinking  Eat a balanced diet that includes fresh fruits and vegetables, whole grains, good sources of protein  such as meat, eggs, or tofu, and low-fat dairy. Your health care provider will help you determine the amount of weight gain that is right for you.  Avoid raw meat and uncooked cheese. These carry germs that can cause birth defects in the baby.  If you have low calcium intake from food, talk to your health care provider about whether you should take a daily calcium supplement.  Eat four or five small meals rather than three large meals a day.  Limit foods that are high in fat and processed sugars, such as fried and sweet foods.  To prevent constipation: ? Drink enough fluid to keep your urine clear or pale yellow. ? Eat foods that are high in fiber, such as fresh fruits and vegetables, whole grains, and beans. Activity  Exercise only as directed by your health care provider. Most women can continue their usual exercise routine during pregnancy. Try to exercise for 30 minutes at least 5 days a week. Stop exercising if you experience uterine contractions.  Avoid heavy   lifting.  Do not exercise in extreme heat or humidity, or at high altitudes.  Wear low-heel, comfortable shoes.  Practice good posture.  You may continue to have sex unless your health care provider tells you otherwise. Relieving pain and discomfort  Take frequent breaks and rest with your legs elevated if you have leg cramps or low back pain.  Take warm sitz baths to soothe any pain or discomfort caused by hemorrhoids. Use hemorrhoid cream if your health care provider approves.  Wear a good support bra to prevent discomfort from breast tenderness.  If you develop varicose veins: ? Wear support pantyhose or compression stockings as told by your healthcare provider. ? Elevate your feet for 15 minutes, 3-4 times a day. Prenatal care  Write down your questions. Take them to your prenatal visits.  Keep all your prenatal visits as told by your health care provider. This is important. Safety  Wear your seat belt at  all times when driving.  Make a list of emergency phone numbers, including numbers for family, friends, the hospital, and police and fire departments. General instructions  Avoid cat litter boxes and soil used by cats. These carry germs that can cause birth defects in the baby. If you have a cat, ask someone to clean the litter box for you.  Do not travel far distances unless it is absolutely necessary and only with the approval of your health care provider.  Do not use hot tubs, steam rooms, or saunas.  Do not drink alcohol.  Do not use any products that contain nicotine or tobacco, such as cigarettes and e-cigarettes. If you need help quitting, ask your health care provider.  Do not use any medicinal herbs or unprescribed drugs. These chemicals affect the formation and growth of the baby.  Do not douche or use tampons or scented sanitary pads.  Do not cross your legs for long periods of time.  To prepare for the arrival of your baby: ? Take prenatal classes to understand, practice, and ask questions about labor and delivery. ? Make a trial run to the hospital. ? Visit the hospital and tour the maternity area. ? Arrange for maternity or paternity leave through employers. ? Arrange for family and friends to take care of pets while you are in the hospital. ? Purchase a rear-facing car seat and make sure you know how to install it in your car. ? Pack your hospital bag. ? Prepare the baby's nursery. Make sure to remove all pillows and stuffed animals from the baby's crib to prevent suffocation.  Visit your dentist if you have not gone during your pregnancy. Use a soft toothbrush to brush your teeth and be gentle when you floss. Contact a health care provider if:  You are unsure if you are in labor or if your water has broken.  You become dizzy.  You have mild pelvic cramps, pelvic pressure, or nagging pain in your abdominal area.  You have lower back pain.  You have persistent  nausea, vomiting, or diarrhea.  You have an unusual or bad smelling vaginal discharge.  You have pain when you urinate. Get help right away if:  Your water breaks before 37 weeks.  You have regular contractions less than 5 minutes apart before 37 weeks.  You have a fever.  You are leaking fluid from your vagina.  You have spotting or bleeding from your vagina.  You have severe abdominal pain or cramping.  You have rapid weight loss or weight gain.    You have shortness of breath with chest pain.  You notice sudden or extreme swelling of your face, hands, ankles, feet, or legs.  Your baby makes fewer than 10 movements in 2 hours.  You have severe headaches that do not go away when you take medicine.  You have vision changes. Summary  The third trimester is from week 28 through week 40, months 7 through 9. The third trimester is a time when the unborn baby (fetus) is growing rapidly.  During the third trimester, your discomfort may increase as you and your baby continue to gain weight. You may have abdominal, leg, and back pain, sleeping problems, and an increased need to urinate.  During the third trimester your breasts will keep growing and they will continue to become tender. A yellow fluid (colostrum) may leak from your breasts. This is the first milk you are producing for your baby.  False labor is a condition in which you feel small, irregular tightenings of the muscles in the womb (contractions) that eventually go away. These are called Braxton Hicks contractions. Contractions may last for hours, days, or even weeks before true labor sets in.  Signs of labor can include: abdominal cramps; regular contractions that start at 10 minutes apart and become stronger and more frequent with time; watery or bloody mucus discharge that comes from the vagina; increased pelvic pressure and dull back pain; and leaking of amniotic fluid. This information is not intended to replace advice  given to you by your health care provider. Make sure you discuss any questions you have with your health care provider. Document Released: 07/03/2001 Document Revised: 12/15/2015 Document Reviewed: 09/09/2012 Elsevier Interactive Patient Education  2017 [employer]  

## 2016-12-20 NOTE — Progress Notes (Signed)
   PRENATAL VISIT NOTE  Subjective:  Julia Bautista is a 36 y.o. [id number] at 2471w5d being seen today for ongoing prenatal care.  She is currently monitored for the following issues for this low-risk pregnancy and has History of abnormal cervical Pap smear; Supervision of normal pregnancy; history of Herpes; History of loop electrical excision procedure (LEEP); and History of cesarean delivery on her problem list.  Patient reports no complaints.  Contractions: Irregular.  .  Movement: Present. Denies leaking of fluid.   The following portions of the patient's history were reviewed and updated as appropriate: allergies, current medications, past family history, past medical history, past social history, past surgical history and problem list. Problem list updated.  Objective:   Vitals:   12/20/16 1024  BP: 118/83  Weight: 93.2 kg (205 lb 8 oz)    Fetal Status: Fetal Heart Rate (bpm): 144   Movement: Present     General:  Alert, oriented and cooperative. Patient is in no acute distress.  Skin: Skin is warm and dry. No rash noted.   Cardiovascular: Normal heart rate noted  Respiratory: Normal respiratory effort, no problems with respiration noted  Abdomen: Soft, gravid, appropriate for gestational age. Pain/Pressure: Present     Pelvic:  Cervical exam deferred        Extremities: Normal range of motion.     Mental Status: Normal mood and affect. Normal behavior. Normal judgment and thought content.   Assessment and Plan:  Pregnancy: [id number] at 3471w5d  1. history of Herpes  - valACYclovir (VALTREX) 500 MG tablet; Take 1 tablet (500 mg total) by mouth daily.  Dispense: 30 tablet; Refill: 0  2. Encounter for supervision of normal pregnancy, antepartum, unspecified gravidity Doing well  Preterm labor symptoms and general obstetric precautions including but not limited to vaginal bleeding, contractions, leaking of fluid and fetal movement were reviewed in detail with the patient. Please  refer to After Visit Summary for other counseling recommendations.  Return in about 2 weeks (around 01/03/2017).   Caren Griffinseirdre Shauntelle Jamerson, CNM

## 2017-01-03 ENCOUNTER — Other Ambulatory Visit (HOSPITAL_COMMUNITY)
Admission: RE | Admit: 2017-01-03 | Discharge: 2017-01-03 | Disposition: A | Discharge status: Home / Self Care | Payer: Medicaid Other | Source: Ambulatory Visit | Attending: Medical | Admitting: Medical

## 2017-01-03 ENCOUNTER — Ambulatory Visit (INDEPENDENT_AMBULATORY_CARE_PROVIDER_SITE_OTHER): Payer: Medicaid Other | Admitting: Medical

## 2017-01-03 VITALS — BP 119/88 | HR 72 | Wt 208.1 lb

## 2017-01-03 DIAGNOSIS — Z113 Encounter for screening for infections with a predominantly sexual mode of transmission: Secondary | ICD-10-CM | POA: Diagnosis not present

## 2017-01-03 DIAGNOSIS — B009 Herpesviral infection, unspecified: Secondary | ICD-10-CM

## 2017-01-03 DIAGNOSIS — Z3480 Encounter for supervision of other normal pregnancy, unspecified trimester: Secondary | ICD-10-CM

## 2017-01-03 DIAGNOSIS — Z3483 Encounter for supervision of other normal pregnancy, third trimester: Secondary | ICD-10-CM | POA: Insufficient documentation

## 2017-01-03 DIAGNOSIS — G5600 Carpal tunnel syndrome, unspecified upper limb: Secondary | ICD-10-CM

## 2017-01-03 DIAGNOSIS — O26893 Other specified pregnancy related conditions, third trimester: Secondary | ICD-10-CM | POA: Diagnosis not present

## 2017-01-03 DIAGNOSIS — O34219 Maternal care for unspecified type scar from previous cesarean delivery: Secondary | ICD-10-CM

## 2017-01-03 DIAGNOSIS — O26899 Other specified pregnancy related conditions, unspecified trimester: Secondary | ICD-10-CM

## 2017-01-03 DIAGNOSIS — Z98891 History of uterine scar from previous surgery: Secondary | ICD-10-CM

## 2017-01-03 NOTE — Patient Instructions (Signed)
Fetal Movement Counts °Patient Name: ________________________________________________ Patient Due Date: ____________________ °What is a fetal movement count? °A fetal movement count is the number of times that you feel your baby move during a certain amount of time. This may also be called a fetal kick count. A fetal movement count is recommended for every pregnant woman. You may be asked to start counting fetal movements as early as week 28 of your pregnancy. °Pay attention to when your baby is most active. You may notice your baby's sleep and wake cycles. You may also notice things that make your baby move more. You should do a fetal movement count: °· When your baby is normally most active. °· At the same time each day. ° °A good time to count movements is while you are resting, after having something to eat and drink. °How do I count fetal movements? °1. Find a quiet, comfortable area. Sit, or lie down on your side. °2. Write down the date, the start time and stop time, and the number of movements that you felt between those two times. Take this information with you to your health care visits. °3. For 2 hours, count kicks, flutters, swishes, rolls, and jabs. You should feel at least 10 movements during 2 hours. °4. You may stop counting after you have felt 10 movements. °5. If you do not feel 10 movements in 2 hours, have something to eat and drink. Then, keep resting and counting for 1 hour. If you feel at least 4 movements during that hour, you may stop counting. °Contact a health care provider if: °· You feel fewer than 4 movements in 2 hours. °· Your baby is not moving like he or she usually does. °Date: ____________ Start time: ____________ Stop time: ____________ Movements: ____________ °Date: ____________ Start time: ____________ Stop time: ____________ Movements: ____________ °Date: ____________ Start time: ____________ Stop time: ____________ Movements: ____________ °Date: ____________ Start time:  ____________ Stop time: ____________ Movements: ____________ °Date: ____________ Start time: ____________ Stop time: ____________ Movements: ____________ °Date: ____________ Start time: ____________ Stop time: ____________ Movements: ____________ °Date: ____________ Start time: ____________ Stop time: ____________ Movements: ____________ °Date: ____________ Start time: ____________ Stop time: ____________ Movements: ____________ °Date: ____________ Start time: ____________ Stop time: ____________ Movements: ____________ °This information is not intended to replace advice given to you by your health care provider. Make sure you discuss any questions you have with your health care provider. °Document Released: 08/08/2006 Document Revised: 03/07/2016 Document Reviewed: 08/18/2015 °Elsevier Interactive Patient Education © 2018 [employer] °Braxton Hicks Contractions °Contractions of the uterus can occur throughout pregnancy, but they are not always a sign that you are in labor. You may have practice contractions called Braxton Hicks contractions. These false labor contractions are sometimes confused with true labor. °What are Braxton Hicks contractions? °Braxton Hicks contractions are tightening movements that occur in the muscles of the uterus before labor. Unlike true labor contractions, these contractions do not result in opening (dilation) and thinning of the cervix. Toward the end of pregnancy (32-34 weeks), Braxton Hicks contractions can happen more often and may become stronger. These contractions are sometimes difficult to tell apart from true labor because they can be very uncomfortable. You should not feel embarrassed if you go to the hospital with false labor. °Sometimes, the only way to tell if you are in true labor is for your health care provider to look for changes in the cervix. The health care provider will do a physical exam and may monitor your contractions. If   you are not in true labor, the exam  should show that your cervix is not dilating and your water has not broken. °If there are no prenatal problems or other health problems associated with your pregnancy, it is completely safe for you to be sent home with false labor. You may continue to have Braxton Hicks contractions until you go into true labor. °How can I tell the difference between true labor and false labor? °· Differences °? False labor °? Contractions last 30-70 seconds.: Contractions are usually shorter and not as strong as true labor contractions. °? Contractions become very regular.: Contractions are usually irregular. °? Discomfort is usually felt in the top of the uterus, and it spreads to the lower abdomen and low back.: Contractions are often felt in the front of the lower abdomen and in the groin. °? Contractions do not go away with walking.: Contractions may go away when you walk around or change positions while lying down. °? Contractions usually become more intense and increase in frequency.: Contractions get weaker and are shorter-lasting as time goes on. °? The cervix dilates and gets thinner.: The cervix usually does not dilate or become thin. °Follow these instructions at home: °· Take over-the-counter and prescription medicines only as told by your health care provider. °· Keep up with your usual exercises and follow other instructions from your health care provider. °· Eat and drink lightly if you think you are going into labor. °· If Braxton Hicks contractions are making you uncomfortable: °? Change your position from lying down or resting to walking, or change from walking to resting. °? Sit and rest in a tub of warm water. °? Drink enough fluid to keep your urine clear or pale yellow. Dehydration may cause these contractions. °? Do slow and deep breathing several times an hour. °· Keep all follow-up prenatal visits as told by your health care provider. This is important. °Contact a health care provider if: °· You have a  fever. °· You have continuous pain in your abdomen. °Get help right away if: °· Your contractions become stronger, more regular, and closer together. °· You have fluid leaking or gushing from your vagina. °· You pass blood-tinged mucus (bloody show). °· You have bleeding from your vagina. °· You have low back pain that you never had before. °· You feel your baby’s head pushing down and causing pelvic pressure. °· Your baby is not moving inside you as much as it used to. °Summary °· Contractions that occur before labor are called Braxton Hicks contractions, false labor, or practice contractions. °· Braxton Hicks contractions are usually shorter, weaker, farther apart, and less regular than true labor contractions. True labor contractions usually become progressively stronger and regular and they become more frequent. °· Manage discomfort from Braxton Hicks contractions by changing position, resting in a warm bath, drinking plenty of water, or practicing deep breathing. °This information is not intended to replace advice given to you by your health care provider. Make sure you discuss any questions you have with your health care provider. °Document Released: 07/09/2005 Document Revised: 05/28/2016 Document Reviewed: 05/28/2016 °Elsevier Interactive Patient Education © 2017 Elsevier Inc. ° °

## 2017-01-03 NOTE — Progress Notes (Signed)
   PRENATAL VISIT NOTE  Subjective:  Julia Bautista is a 36 y.o. [id number] at 6811w5d being seen today for ongoing prenatal care.  She is currently monitored for the following issues for this high-risk pregnancy and has History of abnormal cervical Pap smear; Supervision of normal pregnancy; history of Herpes; History of loop electrical excision procedure (LEEP); and History of cesarean delivery on her problem list.  Patient reports no complaints.  Contractions: Irregular. Vag. Bleeding: None.  Movement: Present. Denies leaking of fluid.   The following portions of the patient's history were reviewed and updated as appropriate: allergies, current medications, past family history, past medical history, past social history, past surgical history and problem list. Problem list updated.  Objective:   Vitals:   01/03/17 1049  BP: 119/88  Pulse: 72  Weight: 208 lb 1.6 oz (94.4 kg)    Fetal Status: Fetal Heart Rate (bpm): 165 Fundal Height: 36 cm Movement: Present  Presentation: Vertex  General:  Alert, oriented and cooperative. Patient is in no acute distress.  Skin: Skin is warm and dry. No rash noted.   Cardiovascular: Normal heart rate noted  Respiratory: Normal respiratory effort, no problems with respiration noted  Abdomen: Soft, gravid, appropriate for gestational age. Pain/Pressure: Absent     Pelvic:  Cervical exam performed Dilation: 1 Effacement (%): 50 Station: -3  Extremities: Normal range of motion.  Edema: Trace  Mental Status: Normal mood and affect. Normal behavior. Normal judgment and thought content.   Assessment and Plan:  Pregnancy: [id number] at 3311w5d  1. Supervision of other normal pregnancy, antepartum - GC/Chlamydia probe amp ([place] - Culture, beta strep (group b only)  2. Carpal tunnel syndrome during pregnancy - Advised to try wrist braces  3. history of Herpes - On Valtrex until C/S  4. History of cesarean delivery - schedule for repeat  7/1  Term labor symptoms and general obstetric precautions including but not limited to vaginal bleeding, contractions, leaking of fluid and fetal movement were reviewed in detail with the patient. Please refer to After Visit Summary for other counseling recommendations.  Return in about 1 week (around 01/10/2017) for LOB.   Vonzella NippleJulie Hanish Laraia, PA-C

## 2017-01-03 NOTE — Progress Notes (Signed)
Hand pain and numbness

## 2017-01-04 ENCOUNTER — Encounter (HOSPITAL_COMMUNITY): Payer: Self-pay

## 2017-01-04 LAB — GC/CHLAMYDIA PROBE AMP (~~LOC~~) NOT AT ARMC
Chlamydia: NEGATIVE
NEISSERIA GONORRHEA: NEGATIVE

## 2017-01-06 LAB — CULTURE, BETA STREP (GROUP B ONLY): Strep Gp B Culture: POSITIVE — AB

## 2017-01-07 ENCOUNTER — Encounter (HOSPITAL_COMMUNITY)
Admission: AD | Disposition: A | Discharge status: Home / Self Care | Payer: Self-pay | Source: Ambulatory Visit | Attending: Obstetrics & Gynecology

## 2017-01-07 ENCOUNTER — Inpatient Hospital Stay (HOSPITAL_COMMUNITY)
Admission: AD | Admit: 2017-01-07 | Discharge: 2017-01-10 | DRG: 766 | Disposition: A | Discharge status: Home / Self Care | Payer: Medicaid Other | Source: Ambulatory Visit | Attending: Obstetrics & Gynecology | Admitting: Obstetrics & Gynecology

## 2017-01-07 ENCOUNTER — Inpatient Hospital Stay (HOSPITAL_COMMUNITY): Payer: Medicaid Other | Admitting: Anesthesiology

## 2017-01-07 ENCOUNTER — Encounter (HOSPITAL_COMMUNITY): Payer: Self-pay | Admitting: Certified Nurse Midwife

## 2017-01-07 DIAGNOSIS — D649 Anemia, unspecified: Secondary | ICD-10-CM | POA: Diagnosis present

## 2017-01-07 DIAGNOSIS — O99824 Streptococcus B carrier state complicating childbirth: Secondary | ICD-10-CM | POA: Diagnosis present

## 2017-01-07 DIAGNOSIS — Z98891 History of uterine scar from previous surgery: Secondary | ICD-10-CM

## 2017-01-07 DIAGNOSIS — Z3A37 37 weeks gestation of pregnancy: Secondary | ICD-10-CM

## 2017-01-07 DIAGNOSIS — O133 Gestational [pregnancy-induced] hypertension without significant proteinuria, third trimester: Secondary | ICD-10-CM

## 2017-01-07 DIAGNOSIS — Z87891 Personal history of nicotine dependence: Secondary | ICD-10-CM | POA: Diagnosis not present

## 2017-01-07 DIAGNOSIS — O34211 Maternal care for low transverse scar from previous cesarean delivery: Secondary | ICD-10-CM | POA: Diagnosis present

## 2017-01-07 DIAGNOSIS — O1494 Unspecified pre-eclampsia, complicating childbirth: Secondary | ICD-10-CM | POA: Diagnosis present

## 2017-01-07 DIAGNOSIS — O99214 Obesity complicating childbirth: Secondary | ICD-10-CM | POA: Diagnosis present

## 2017-01-07 DIAGNOSIS — O9902 Anemia complicating childbirth: Secondary | ICD-10-CM | POA: Diagnosis present

## 2017-01-07 DIAGNOSIS — Z6836 Body mass index (BMI) 36.0-36.9, adult: Secondary | ICD-10-CM

## 2017-01-07 DIAGNOSIS — Z3483 Encounter for supervision of other normal pregnancy, third trimester: Secondary | ICD-10-CM

## 2017-01-07 LAB — CBC
HCT: 33 % — ABNORMAL LOW (ref 36.0–46.0)
Hemoglobin: 10.8 g/dL — ABNORMAL LOW (ref 12.0–15.0)
MCH: 25.8 pg — AB (ref 26.0–34.0)
MCHC: 32.7 g/dL (ref 30.0–36.0)
MCV: 78.9 fL (ref 78.0–100.0)
PLATELETS: 211 10*3/uL (ref 150–400)
RBC: 4.18 MIL/uL (ref 3.87–5.11)
RDW: 14.1 % (ref 11.5–15.5)
WBC: 6.3 10*3/uL (ref 4.0–10.5)

## 2017-01-07 LAB — COMPREHENSIVE METABOLIC PANEL
ALBUMIN: 3.1 g/dL — AB (ref 3.5–5.0)
ALT: 9 U/L — ABNORMAL LOW (ref 14–54)
ANION GAP: 8 (ref 5–15)
AST: 21 U/L (ref 15–41)
Alkaline Phosphatase: 323 U/L — ABNORMAL HIGH (ref 38–126)
BUN: 10 mg/dL (ref 6–20)
CALCIUM: 9.1 mg/dL (ref 8.9–10.3)
CHLORIDE: 104 mmol/L (ref 101–111)
CO2: 23 mmol/L (ref 22–32)
Creatinine, Ser: 0.75 mg/dL (ref 0.44–1.00)
GFR calc non Af Amer: >60 mL/min (ref >60)
GLUCOSE: 102 mg/dL — AB (ref 65–99)
POTASSIUM: 4.2 mmol/L (ref 3.5–5.1)
SODIUM: 135 mmol/L (ref 135–145)
Total Bilirubin: 0.4 mg/dL (ref 0.3–1.2)
Total Protein: 6.6 g/dL (ref 6.5–8.1)

## 2017-01-07 LAB — TYPE AND SCREEN
ABO/RH(D): AB POS
ANTIBODY SCREEN: NEGATIVE

## 2017-01-07 LAB — PROTEIN / CREATININE RATIO, URINE
Creatinine, Urine: 109 mg/dL
PROTEIN CREATININE RATIO: 0.41 mg/mg{creat} — AB (ref 0.00–0.15)
TOTAL PROTEIN, URINE: 45 mg/dL

## 2017-01-07 LAB — RPR: RPR: NONREACTIVE

## 2017-01-07 SURGERY — Surgical Case
Anesthesia: Spinal

## 2017-01-07 MED ORDER — SCOPOLAMINE 1 MG/3DAYS TD PT72
MEDICATED_PATCH | TRANSDERMAL | Status: AC
  Filled 2017-01-07: qty 1

## 2017-01-07 MED ORDER — ONDANSETRON HCL 4 MG/2ML IJ SOLN
INTRAMUSCULAR | Status: DC | PRN
  Administered 2017-01-07: 4 mg via INTRAVENOUS

## 2017-01-07 MED ORDER — DEXAMETHASONE SODIUM PHOSPHATE 10 MG/ML IJ SOLN
INTRAMUSCULAR | Status: AC
  Filled 2017-01-07: qty 1

## 2017-01-07 MED ORDER — SOD CITRATE-CITRIC ACID 500-334 MG/5ML PO SOLN
30.0000 mL | Freq: Once | ORAL | Status: AC
  Administered 2017-01-07: 30 mL via ORAL
  Filled 2017-01-07: qty 15

## 2017-01-07 MED ORDER — FENTANYL CITRATE (PF) 100 MCG/2ML IJ SOLN
INTRAMUSCULAR | Status: AC
  Filled 2017-01-07: qty 2

## 2017-01-07 MED ORDER — BUPIVACAINE IN DEXTROSE 0.75-8.25 % IT SOLN
INTRATHECAL | Status: DC | PRN
  Administered 2017-01-07: 1.6 mg via INTRATHECAL

## 2017-01-07 MED ORDER — WITCH HAZEL-GLYCERIN EX PADS: 1.0000 "application " | MEDICATED_PAD | CUTANEOUS | Status: DC | PRN

## 2017-01-07 MED ORDER — PHENYLEPHRINE 8 MG IN D5W 100 ML (0.08MG/ML) PREMIX OPTIME
INJECTION | INTRAVENOUS | Status: AC
Start: 2017-01-07 — End: ?
  Filled 2017-01-07: qty 100

## 2017-01-07 MED ORDER — LABETALOL HCL 5 MG/ML IV SOLN: 20.0000 mg | INTRAVENOUS | Status: DC | PRN

## 2017-01-07 MED ORDER — DIPHENHYDRAMINE HCL 25 MG PO CAPS: 25.0000 mg | ORAL_CAPSULE | ORAL | Status: DC | PRN

## 2017-01-07 MED ORDER — KETOROLAC TROMETHAMINE 30 MG/ML IJ SOLN
INTRAMUSCULAR | Status: AC
  Administered 2017-01-07: 30 mg
  Filled 2017-01-07: qty 1

## 2017-01-07 MED ORDER — TETANUS-DIPHTH-ACELL PERTUSSIS 5-2.5-18.5 LF-MCG/0.5 IM SUSP: 0.5000 mL | Freq: Once | INTRAMUSCULAR | Status: DC

## 2017-01-07 MED ORDER — BUPIVACAINE HCL (PF) 0.5 % IJ SOLN
INTRAMUSCULAR | Status: AC
  Filled 2017-01-07: qty 30

## 2017-01-07 MED ORDER — FENTANYL CITRATE (PF) 100 MCG/2ML IJ SOLN: 25.0000 ug | INTRAMUSCULAR | Status: DC | PRN

## 2017-01-07 MED ORDER — KETOROLAC TROMETHAMINE 30 MG/ML IJ SOLN: 30.0000 mg | Freq: Four times a day (QID) | INTRAMUSCULAR | Status: AC | PRN

## 2017-01-07 MED ORDER — SCOPOLAMINE 1 MG/3DAYS TD PT72
MEDICATED_PATCH | TRANSDERMAL | Status: DC | PRN
  Administered 2017-01-07: 1 via TRANSDERMAL

## 2017-01-07 MED ORDER — ZOLPIDEM TARTRATE 5 MG PO TABS: 5.0000 mg | ORAL_TABLET | Freq: Every evening | ORAL | Status: DC | PRN

## 2017-01-07 MED ORDER — NALBUPHINE HCL 10 MG/ML IJ SOLN: 5.0000 mg | INTRAMUSCULAR | Status: DC | PRN

## 2017-01-07 MED ORDER — SODIUM CHLORIDE 0.9% FLUSH: 3.0000 mL | INTRAVENOUS | Status: DC | PRN

## 2017-01-07 MED ORDER — FERROUS SULFATE 325 (65 FE) MG PO TABS
325.0000 mg | ORAL_TABLET | Freq: Two times a day (BID) | ORAL | Status: DC
  Administered 2017-01-07 – 2017-01-10 (×5): 325 mg via ORAL
  Filled 2017-01-07 (×6): qty 1

## 2017-01-07 MED ORDER — LACTATED RINGERS IV SOLN
INTRAVENOUS | Status: DC | PRN
  Administered 2017-01-07 (×2): via INTRAVENOUS

## 2017-01-07 MED ORDER — DIPHENHYDRAMINE HCL 50 MG/ML IJ SOLN: 12.5000 mg | INTRAMUSCULAR | Status: DC | PRN

## 2017-01-07 MED ORDER — ONDANSETRON HCL 4 MG/2ML IJ SOLN: 4.0000 mg | Freq: Four times a day (QID) | INTRAMUSCULAR | Status: DC | PRN

## 2017-01-07 MED ORDER — SCOPOLAMINE 1 MG/3DAYS TD PT72: 1.0000 | MEDICATED_PATCH | Freq: Once | TRANSDERMAL | Status: DC

## 2017-01-07 MED ORDER — DEXTROSE 5 % IV SOLN
1.0000 ug/kg/h | INTRAVENOUS | Status: DC | PRN
  Filled 2017-01-07: qty 2

## 2017-01-07 MED ORDER — COCONUT OIL OIL
1.0000 "application " | TOPICAL_OIL | Status: DC | PRN
  Administered 2017-01-08: 1 via TOPICAL
  Filled 2017-01-07: qty 120

## 2017-01-07 MED ORDER — PHENYLEPHRINE 8 MG IN D5W 100 ML (0.08MG/ML) PREMIX OPTIME
INJECTION | INTRAVENOUS | Status: DC | PRN
  Administered 2017-01-07: 10 ug/min via INTRAVENOUS

## 2017-01-07 MED ORDER — LACTATED RINGERS IV BOLUS (SEPSIS): 1000.0000 mL | Freq: Once | INTRAVENOUS | Status: DC

## 2017-01-07 MED ORDER — BUPIVACAINE HCL (PF) 0.5 % IJ SOLN
INTRAMUSCULAR | Status: DC | PRN
  Administered 2017-01-07: 30 mL

## 2017-01-07 MED ORDER — FENTANYL CITRATE (PF) 100 MCG/2ML IJ SOLN
INTRAMUSCULAR | Status: DC | PRN
  Administered 2017-01-07: 10 ug via INTRATHECAL
  Administered 2017-01-07: 90 ug via INTRAVENOUS

## 2017-01-07 MED ORDER — IBUPROFEN 600 MG PO TABS
600.0000 mg | ORAL_TABLET | Freq: Four times a day (QID) | ORAL | Status: DC
  Administered 2017-01-07 – 2017-01-10 (×12): 600 mg via ORAL
  Filled 2017-01-07 (×12): qty 1

## 2017-01-07 MED ORDER — OXYTOCIN 10 UNIT/ML IJ SOLN
INTRAVENOUS | Status: DC | PRN
  Administered 2017-01-07: 40 [IU] via INTRAVENOUS

## 2017-01-07 MED ORDER — LACTATED RINGERS IV SOLN
INTRAVENOUS | Status: DC
  Administered 2017-01-07: 03:00:00 via INTRAVENOUS

## 2017-01-07 MED ORDER — PRENATAL MULTIVITAMIN CH
1.0000 | ORAL_TABLET | Freq: Every day | ORAL | Status: DC
  Administered 2017-01-08 – 2017-01-10 (×3): 1 via ORAL
  Filled 2017-01-07 (×3): qty 1

## 2017-01-07 MED ORDER — DEXAMETHASONE SODIUM PHOSPHATE 10 MG/ML IJ SOLN
INTRAMUSCULAR | Status: DC | PRN
  Administered 2017-01-07: 10 mg via INTRAVENOUS

## 2017-01-07 MED ORDER — MENTHOL 3 MG MT LOZG: 1.0000 | LOZENGE | OROMUCOSAL | Status: DC | PRN

## 2017-01-07 MED ORDER — MEPERIDINE HCL 25 MG/ML IJ SOLN: 6.2500 mg | INTRAMUSCULAR | Status: DC | PRN

## 2017-01-07 MED ORDER — MAGNESIUM HYDROXIDE 400 MG/5ML PO SUSP: 30.0000 mL | ORAL | Status: DC | PRN

## 2017-01-07 MED ORDER — NALBUPHINE HCL 10 MG/ML IJ SOLN: 5.0000 mg | Freq: Once | INTRAMUSCULAR | Status: DC | PRN

## 2017-01-07 MED ORDER — MORPHINE SULFATE (PF) 0.5 MG/ML IJ SOLN
INTRAMUSCULAR | Status: DC | PRN
  Administered 2017-01-07: .2 mg via INTRATHECAL

## 2017-01-07 MED ORDER — SIMETHICONE 80 MG PO CHEW
80.0000 mg | CHEWABLE_TABLET | ORAL | Status: DC | PRN
  Administered 2017-01-08: 80 mg via ORAL
  Filled 2017-01-07: qty 1

## 2017-01-07 MED ORDER — NALOXONE HCL 0.4 MG/ML IJ SOLN: 0.4000 mg | INTRAMUSCULAR | Status: DC | PRN

## 2017-01-07 MED ORDER — ONDANSETRON HCL 4 MG/2ML IJ SOLN
4.0000 mg | Freq: Three times a day (TID) | INTRAMUSCULAR | Status: DC | PRN
  Administered 2017-01-08: 4 mg via INTRAVENOUS
  Filled 2017-01-07: qty 2

## 2017-01-07 MED ORDER — FAMOTIDINE IN NACL 20-0.9 MG/50ML-% IV SOLN
20.0000 mg | Freq: Once | INTRAVENOUS | Status: AC
  Administered 2017-01-07: 20 mg via INTRAVENOUS
  Filled 2017-01-07: qty 50

## 2017-01-07 MED ORDER — SENNOSIDES-DOCUSATE SODIUM 8.6-50 MG PO TABS
2.0000 | ORAL_TABLET | ORAL | Status: DC
  Administered 2017-01-07 – 2017-01-08 (×2): 2 via ORAL
  Filled 2017-01-07 (×2): qty 2

## 2017-01-07 MED ORDER — CEFAZOLIN SODIUM-DEXTROSE 2-4 GM/100ML-% IV SOLN
2.0000 g | Freq: Once | INTRAVENOUS | Status: AC
Start: 2017-01-07 — End: 2017-01-07
  Administered 2017-01-07: 2 g via INTRAVENOUS
  Filled 2017-01-07: qty 100

## 2017-01-07 MED ORDER — MORPHINE SULFATE (PF) 0.5 MG/ML IJ SOLN
INTRAMUSCULAR | Status: AC
  Filled 2017-01-07: qty 10

## 2017-01-07 MED ORDER — ACETAMINOPHEN 325 MG PO TABS
650.0000 mg | ORAL_TABLET | ORAL | Status: DC | PRN
  Administered 2017-01-07 – 2017-01-09 (×3): 650 mg via ORAL
  Filled 2017-01-07 (×3): qty 2

## 2017-01-07 MED ORDER — HYDRALAZINE HCL 20 MG/ML IJ SOLN: 10.0000 mg | Freq: Once | INTRAMUSCULAR | Status: DC | PRN

## 2017-01-07 MED ORDER — OXYTOCIN 40 UNITS IN LACTATED RINGERS INFUSION - SIMPLE MED
2.5000 [IU]/h | INTRAVENOUS | Status: AC
Start: 2017-01-07 — End: 2017-01-07

## 2017-01-07 MED ORDER — SIMETHICONE 80 MG PO CHEW
80.0000 mg | CHEWABLE_TABLET | ORAL | Status: DC
  Administered 2017-01-07 – 2017-01-09 (×3): 80 mg via ORAL
  Filled 2017-01-07 (×3): qty 1

## 2017-01-07 MED ORDER — DIPHENHYDRAMINE HCL 25 MG PO CAPS: 25.0000 mg | ORAL_CAPSULE | Freq: Four times a day (QID) | ORAL | Status: DC | PRN

## 2017-01-07 MED ORDER — ONDANSETRON HCL 4 MG/2ML IJ SOLN
INTRAMUSCULAR | Status: AC
Start: 2017-01-07 — End: ?
  Filled 2017-01-07: qty 2

## 2017-01-07 MED ORDER — DIBUCAINE 1 % RE OINT: 1.0000 "application " | TOPICAL_OINTMENT | RECTAL | Status: DC | PRN

## 2017-01-07 MED ORDER — OXYTOCIN 10 UNIT/ML IJ SOLN
INTRAMUSCULAR | Status: AC
  Filled 2017-01-07: qty 4

## 2017-01-07 MED ORDER — BUPIVACAINE IN DEXTROSE 0.75-8.25 % IT SOLN
INTRATHECAL | Status: AC
  Filled 2017-01-07: qty 2

## 2017-01-07 MED ORDER — LACTATED RINGERS IV SOLN
INTRAVENOUS | Status: DC
  Administered 2017-01-07: via INTRAVENOUS

## 2017-01-07 MED ORDER — MEASLES, MUMPS & RUBELLA VAC ~~LOC~~ INJ
0.5000 mL | INJECTION | Freq: Once | SUBCUTANEOUS | Status: DC
  Filled 2017-01-07: qty 0.5

## 2017-01-07 MED ORDER — HYDROMORPHONE HCL 2 MG PO TABS
2.0000 mg | ORAL_TABLET | ORAL | Status: DC | PRN
Start: 2017-01-07 — End: 2017-01-10
  Administered 2017-01-08 – 2017-01-09 (×3): 2 mg via ORAL
  Filled 2017-01-07 (×3): qty 1

## 2017-01-07 SURGICAL SUPPLY — 41 items
BENZOIN TINCTURE PRP APPL 2/3 (GAUZE/BANDAGES/DRESSINGS) ×3 IMPLANT
BLADE TIP J-PLASMA PRECISE LAP (MISCELLANEOUS) ×3 IMPLANT
CHLORAPREP W/TINT 26ML (MISCELLANEOUS) ×3 IMPLANT
CLAMP CORD UMBIL (MISCELLANEOUS) IMPLANT
CLOSURE WOUND 1/2 X4 (GAUZE/BANDAGES/DRESSINGS) ×1
CLOTH BEACON ORANGE TIMEOUT ST (SAFETY) ×3 IMPLANT
DRSG OPSITE POSTOP 4X10 (GAUZE/BANDAGES/DRESSINGS) ×3 IMPLANT
ELECT REM PT RETURN 9FT ADLT (ELECTROSURGICAL) ×3
ELECTRODE REM PT RTRN 9FT ADLT (ELECTROSURGICAL) ×1 IMPLANT
EXTRACTOR VACUUM KIWI (MISCELLANEOUS) IMPLANT
GLOVE BIO SURGEON STRL SZ7 (GLOVE) ×3 IMPLANT
GLOVE BIOGEL PI IND STRL 7.0 (GLOVE) ×2 IMPLANT
GLOVE BIOGEL PI INDICATOR 7.0 (GLOVE) ×4
GOWN STRL REUS W/TWL LRG LVL3 (GOWN DISPOSABLE) ×6 IMPLANT
GOWN STRL REUS W/TWL XL LVL3 (GOWN DISPOSABLE) ×3 IMPLANT
HOVERMATT SINGLE USE (MISCELLANEOUS) ×3 IMPLANT
KIT ABG SYR 3ML LUER SLIP (SYRINGE) IMPLANT
NEEDLE HYPO 22GX1.5 SAFETY (NEEDLE) ×3 IMPLANT
NEEDLE HYPO 25X5/8 SAFETYGLIDE (NEEDLE) IMPLANT
NS IRRIG 1000ML POUR BTL (IV SOLUTION) ×3 IMPLANT
PACK C SECTION WH (CUSTOM PROCEDURE TRAY) ×3 IMPLANT
PAD ABD 7.5X8 STRL (GAUZE/BANDAGES/DRESSINGS) ×6 IMPLANT
PAD OB MATERNITY 4.3X12.25 (PERSONAL CARE ITEMS) ×3 IMPLANT
PENCIL SMOKE EVAC W/HOLSTER (ELECTROSURGICAL) ×3 IMPLANT
RETRACTOR WND ALEXIS 25 LRG (MISCELLANEOUS) ×1 IMPLANT
RTRCTR C-SECT PINK 25CM LRG (MISCELLANEOUS) IMPLANT
RTRCTR WOUND ALEXIS 25CM LRG (MISCELLANEOUS) ×3
SPONGE GAUZE 4X4 12PLY STER LF (GAUZE/BANDAGES/DRESSINGS) ×6 IMPLANT
SPONGE SURGIFOAM ABS GEL 12-7 (HEMOSTASIS) IMPLANT
STRIP CLOSURE SKIN 1/2X4 (GAUZE/BANDAGES/DRESSINGS) ×2 IMPLANT
SUT PDS AB 0 CTX 60 (SUTURE) IMPLANT
SUT PLAIN 0 NONE (SUTURE) IMPLANT
SUT SILK 0 TIES 10X30 (SUTURE) IMPLANT
SUT VIC AB 0 CT1 36 (SUTURE) ×9 IMPLANT
SUT VIC AB 2-0 CT1 (SUTURE) ×3 IMPLANT
SUT VIC AB 3-0 CT1 27 (SUTURE) ×2
SUT VIC AB 3-0 CT1 TAPERPNT 27 (SUTURE) ×1 IMPLANT
SUT VIC AB 4-0 KS 27 (SUTURE) IMPLANT
SYR CONTROL 10ML LL (SYRINGE) ×3 IMPLANT
TOWEL OR 17X24 6PK STRL BLUE (TOWEL DISPOSABLE) ×3 IMPLANT
TRAY FOLEY BAG SILVER LF 14FR (SET/KITS/TRAYS/PACK) ×3 IMPLANT

## 2017-01-07 NOTE — Anesthesia Procedure Notes (Signed)
Spinal  Patient location during procedure: OR Start time: 01/07/2017 3:54 AM End time: 01/07/2017 4:01 AM Staffing Anesthesiologist: Chaney MallingHODIERNE, Ori Trejos Performed: anesthesiologist  Preanesthetic Checklist Completed: patient identified, surgical consent, pre-op evaluation, timeout performed, IV checked, risks and benefits discussed and monitors and equipment checked Spinal Block Patient position: sitting Prep: DuraPrep Patient monitoring: cardiac monitor, continuous pulse ox and blood pressure Approach: midline Location: L3-4 Injection technique: single-shot Needle Needle type: Pencan  Needle gauge: 24 G Needle length: 9 cm Assessment Sensory level: T10 Additional Notes Functioning IV was confirmed and monitors were applied. Sterile prep and drape, including hand hygiene and sterile gloves were used. The patient was positioned and the spine was prepped. The skin was anesthetized with lidocaine.  Free flow of clear CSF was obtained prior to injecting local anesthetic into the CSF.  The spinal needle aspirated freely following injection.  The needle was carefully withdrawn.  The patient tolerated the procedure well.

## 2017-01-07 NOTE — Lactation Note (Signed)
This note was copied from a baby's chart. Lactation Consultation Note  Patient Name: Boy Cleda MccreedySophia Sammons ZOXWR'UToday's Date: 01/07/2017 Reason for consult: Follow-up assessment  Follow up visit at 18 hours of age. Mom reports recent feeding for about 30 minutes with about 3mls of EBM syringe fed to baby.  Mom reports she doesn't like the syringe feeding.  LC encouraged mom to call for assist and Rn maybe able to help with spoon feeding for supplement of EBM.   Mom reports having visitors between 1530 and 2130 feedings.  LC encouraged mom to wake baby and ask visitors to leave so she can feed baby.  LC encouraged mom to wake baby 2 1/2-3 hours to get 8-12 feedings/24 hours.  Mom has extra EBM for next feeding and is pumping with each feeding.   Mom denies concerns at this time.     Maternal Data    Feeding Feeding Type: Breast Fed Length of feed: 30 min  LATCH Score/Interventions                      Lactation Tools Discussed/Used     Consult Status Consult Status: Follow-up Date: 01/08/17 Follow-up type: In-patient    Shoptaw, Arvella MerlesJana Lynn 01/07/2017, 10:56 PM

## 2017-01-07 NOTE — Transfer of Care (Signed)
Immediate Anesthesia Transfer of Care Note  Patient: Julia DowdySophia L Bautista  Procedure(s) Performed: Procedure(s): CESAREAN SECTION (N/A)  Patient Location: PACU  Anesthesia Type:Spinal  Level of Consciousness: awake  Airway & Oxygen Therapy: Patient Spontanous Breathing  Post-op Assessment: Report given to RN and Post -op Vital signs reviewed and stable  Post vital signs: stable  Last Vitals:  Vitals:   01/07/17 0238 01/07/17 0246  BP: (!) 155/103 (!) 151/95  Pulse: 70 61  Resp:    Temp:      Last Pain:  Vitals:   01/07/17 0233  TempSrc:   PainSc: 8          Complications: No apparent anesthesia complications

## 2017-01-07 NOTE — Anesthesia Postprocedure Evaluation (Signed)
Anesthesia Post Note  Patient: Julia DowdySophia L Tisby  Procedure(s) Performed: Procedure(s) (LRB): CESAREAN SECTION (N/A)     Patient location during evaluation: Mother Baby Anesthesia Type: Spinal Level of consciousness: awake Pain management: satisfactory to patient Vital Signs Assessment: post-procedure vital signs reviewed and stable Respiratory status: spontaneous breathing Cardiovascular status: stable Anesthetic complications: no    Last Vitals:  Vitals:   01/07/17 0946 01/07/17 1031  BP: 121/86 133/85  Pulse: 66 78  Resp: 18 20  Temp:      Last Pain:  Vitals:   01/07/17 1032  TempSrc:   PainSc: 3    Pain Goal:                 KeyCorpBURGER,Chaden Doom

## 2017-01-07 NOTE — H&P (Signed)
Obstetric Preoperative History and Physical  Boni Elbert EwingsL Jimmey Ralpharker is a 36 y.o. [id number] with IUP at 8339w2d presenting to MAU with frequent contractions and blood show.  Has previous cesarean section, desires elective repeat.  Was also noted to have elevated BP during evaluation in MAU; denies headache, visual symptoms or RUQ/epigastric pain.  for scheduled cesarean section.  No acute concerns.   Prenatal Course [facility]  Pregnancy complications or risks: Patient Active Problem List   Diagnosis Date Noted  . Gestational hypertension, third trimester 01/07/2017  . Supervision of normal pregnancy 06/11/2016  . history of Herpes 06/11/2016  . History of loop electrical excision procedure (LEEP) 06/11/2016  . History of cesarean delivery 06/11/2016  . History of abnormal cervical Pap smear 01/25/2016   Prenatal labs and studies: [facility]  Prenatal Labs  Dating  7/7 (LMP=9wk u/s) Blood type: AB/POS/-- (12/11 1205) AB  Genetic Screen 1 Screen: nml    AFP:   negative   Antibody:NEG (12/11 1205)neg  Anatomic US  nml Rubella: 23.60 (12/11 1205)  GTT Third trimester: negative RPR: NON REAC (12/11 1205)   Flu vaccine  Declined HBsAg: NEGATIVE (12/11 1205)   TDaP vaccine  Declined 11/23/2016                             HIV: NONREACTIVE (12/11 1205)   Baby Food  Breast                                             GBS: Positive (01/03/17)  Contraception  undecided, likely will just use condoms Pap: 10/12 Normal Colpo  Circumcision  Inpt Circ   [facility]ian  [facility] ([facility] as daughter)   Support Person  Fiance Domingo Pulsebel Terry      Past Medical History:  Diagnosis Date  . Abnormal Pap smear   . Asthma   . Herpes   . Other acne    ectopic pregnancy.     Past Surgical History:  Procedure Laterality Date  . CESAREAN SECTION  2005  . COLPOSCOPY  2013  . FINGER SURGERY    . LEEP  2013  . ROTATOR CUFF REPAIR    . TOOTH EXTRACTION    . UNILATERAL SALPINGECTOMY Right 2008   via  mini-lap    OB History  Gravida Para Term Preterm AB Living  7 1 1   5 1   SAB TAB Ectopic Multiple Live Births  2 2 1   1     # Outcome Date GA Lbr Len/2nd Weight Sex Delivery Anes PTL Lv  7 Current           6 SAB 03/23/16 429w4d         5 Ectopic 2007          4 Term 2005   6 lb 9 oz (2.977 kg) F CS-Unspec EPI  LIV     [facility], no other complications  3 SAB           2 TAB           1 TAB             Obstetric Comments  EAB x 3 (all D&Cs), 2015 pLTCS @ 6cm per patient, SAB x 2    Social History  Social History  . Marital status: Single    Spouse name: N/A  . Number of children: N/A  . Years of education: N/A   Social History Main Topics  . Smoking status: Former Smoker    Packs/day: 0.00    Years: 12.00    Types: E-cigarettes    Quit date: 06/04/2016  . Smokeless tobacco: Never Used     Comment: 1-800- ouit # given  . Alcohol use 0.0 oz/week     Comment: socially before pregancy  . Drug use: No  . Sexual activity: Yes    Partners: Male    Birth control/ protection: None   Other Topics Concern  . None   Social History Narrative  . None    Family History  Problem Relation Age of Onset  . Diabetes Mother   . Hypertension Sister     Prescriptions Prior to Admission  Medication Sig Dispense Refill Last Dose  . Prenatal Vit-Fe Fumarate-FA (PRENATAL MULTIVITAMIN) TABS tablet Take 1 tablet by mouth daily at 12 noon.   01/06/2017 at Unknown time  . valACYclovir (VALTREX) 500 MG tablet Take 1 tablet (500 mg total) by mouth daily. 30 tablet 0 01/06/2017 at Unknown time  . acetaminophen (TYLENOL) 500 MG tablet Take 500 mg by mouth every 6 (six) hours as needed for moderate pain.    Taking  . albuterol (PROVENTIL HFA;VENTOLIN HFA) 108 (90 Base) MCG/ACT inhaler Inhale 2 puffs into the lungs every 6 (six) hours as needed for wheezing or shortness of breath. Reported on 01/25/2016   Taking    Allergies  Allergen Reactions  . Darvon  [Propoxyphene] Itching  . Percocet [Oxycodone-Acetaminophen] Hives  . Sulfa Drugs Cross Reactors Hives    Review of Systems: Negative except for what is mentioned in HPI.  Physical Exam: BP (!) 151/95   Pulse 61   Temp 98.7 F (37.1 C) (Oral)   Resp 18   Ht 5\' 3"  (1.6 m)   LMP 04/21/2016 (Exact Date)   Patient Vitals for the past 24 hrs:  BP Temp Temp src Pulse Resp Height  01/07/17 0246 (!) 151/95 - - 61 - -  01/07/17 0238 (!) 155/103 - - 70 - -  01/07/17 0231 (!) 155/99 98.7 F (37.1 C) Oral 82 18 5\' 3"  (1.6 m)   FHT: 120 bpm, +accels, +moderate variability, no decels  Toco: q3-4 min contractions CONSTITUTIONAL: Well-developed, well-nourished female in no acute distress.  HENT:  Normocephalic, atraumatic, External right and left ear normal. Oropharynx is clear and moist EYES: Conjunctivae and EOM are normal. Pupils are equal, round, and reactive to light. No scleral icterus.  NECK: Normal range of motion, supple, no masses SKIN: Skin is warm and dry. No rash noted. Not diaphoretic. No erythema. No pallor. NEUROLGIC: Alert and oriented to person, place, and time. 2+ reflexes, muscle tone coordination. No cranial nerve deficit noted. PSYCHIATRIC: Normal mood and affect. Normal behavior. Normal judgment and thought content. CARDIOVASCULAR: Normal heart rate noted, regular rhythm RESPIRATORY: Effort and breath sounds normal, no problems with respiration noted ABDOMEN: Soft, nontender, nondistended, gravid. Well-healed Pfannenstiel incision. PELVIC: 2/90/1 with blood show MUSCULOSKELETAL: Normal range of motion. No edema and no tenderness. 2+ distal pulses.   Pertinent Labs/Studies:  Pending  Assessment and Plan :MCKINZI ERIKSEN is a 36 y.o. [phone] at [redacted]w[redacted]d being admitted for repeat cesarean section in the setting of early labor and new diagnosis of hypertension in the third trimester. No symptoms of preeclampsia, labs pending, will follow up results  and manage accordingly.  The  risks of cesarean section discussed with the patient included but were not limited to: bleeding which may require transfusion or reoperation; infection which may require antibiotics; injury to bowel, bladder, ureters or other surrounding organs; injury to the fetus; need for additional procedures including hysterectomy in the event of a life-threatening hemorrhage; placental abnormalities wth subsequent pregnancies, incisional problems, thromboembolic phenomenon and other postoperative/anesthesia complications. The patient concurred with the proposed plan, giving informed written consent for the procedure. Patient has been NPO since last night  at 2100 she will remain NPO for procedure. Anesthesia and OR aware. Preoperative prophylactic antibiotics and SCDs ordered on call to the OR. To OR when ready.    Jaynie Collins, MD, FACOG  Attending Obstetrician & Gynecologist  Faculty Practice, [facility]

## 2017-01-07 NOTE — Lactation Note (Signed)
This note was copied from a baby's chart. Lactation Consultation Note  Patient Name: Julia Cleda MccreedySophia Grismer ZOXWR'UToday's Date: 01/07/2017 Reason for consult: Initial assessment;Other (Comment) (early term) Mom reports baby has latched few times, now 7 hours old. She pumped with DEBP and gave baby back EBM via syringe. Basic teaching reviewed with Mom. Advised to BF with feeding ques but that baby is early term and may not give feeding ques as often as needed. Discussed with Mom that she may need to wake baby to BF. Advised baby should be at breast 8-12 times in 24 hours. Encouraged STS. Continue to pump after feedings and give baby back any amount of EBM received. Lactation brochure left for review, advised of OP services and support group. Mom to call [facility] feeding [facility] and to teach hand expression.   Maternal Data Does the patient have breastfeeding experience prior to this delivery?: Yes  Feeding Feeding Type: Breast Milk Length of feed: 10 min  LATCH Score/Interventions Latch: Grasps breast easily, tongue down, lips flanged, rhythmical sucking.  Audible Swallowing: A few with stimulation Intervention(s): Skin to skin;Hand expression  Type of Nipple: Flat Intervention(s): Reverse pressure (hand expression)  Comfort (Breast/Nipple): Soft / non-tender     Hold (Positioning): Full assist, staff holds infant at breast  LATCH Score: 6  Lactation Tools Discussed/Used Tools: Pump Breast pump type: Double-Electric Breast Pump WIC Program: No Pump Review: Setup, frequency, and cleaning Initiated by:: BM Date initiated:: 01/07/17   Consult Status Consult Status: Follow-up Date: 01/07/17 Follow-up type: In-patient    Alfred LevinsGranger, Lynnox Girten Ann 01/07/2017, 11:38 AM

## 2017-01-07 NOTE — Op Note (Addendum)
Julia Bautista PROCEDURE DATE: 01/07/2017  PREOPERATIVE DIAGNOSES: Intrauterine pregnancy at 4130w2d weeks gestation; previous cesarean section; preeclampsia; early labor; desires repeat cesarean delivery  POSTOPERATIVE DIAGNOSES: The same  PROCEDURE: Repeat Low Transverse Cesarean Section  SURGEON:  Dr. Jaynie CollinsUgonna Jlynn Ly  ANESTHESIOLOGY TEAM: Anesthesiologist: Achille RichHodierne, Adam, MD CRNA: Renford DillsMullins, Janet L, CRNA  INDICATIONS: Julia DowdySophia L Bautista is a [age] y.o. [phone] at 6030w2d here for cesarean section secondary to the indications listed under preoperative diagnoses; please see preoperative note for further details.  The risks of cesarean section were discussed with the patient including but were not limited to: bleeding which may require transfusion or reoperation; infection which may require antibiotics; injury to bowel, bladder, ureters or other surrounding organs; injury to the fetus; need for additional procedures including hysterectomy in the event of a life-threatening hemorrhage; placental abnormalities wth subsequent pregnancies, incisional problems, thromboembolic phenomenon and other postoperative/anesthesia complications.   The patient concurred with the proposed plan, giving informed written consent for the procedure.    FINDINGS:  Viable female infant in cephalic presentation.  Apgars 9 and 9.  Clear amniotic fluid.  Intact placenta, three vessel cord.  Normal uterus, fallopian tubes and ovaries bilaterally. Significant pre-peritoneal adhesive disease, rectus muscles adherent to peritoneum and fascia.  Some omental adhesions to anterior abdominal wall, which were lysed using electrocautery. Superficial serosal adhesions were bluntly lysed.  No adhesions involving bowel or bladder.  ANESTHESIA: Spinal  INTRAVENOUS FLUIDS: 1000 ml   ESTIMATED BLOOD LOSS: 500 ml URINE OUTPUT:  100 ml SPECIMENS: Placenta sent to L&D COMPLICATIONS: None immediate  PROCEDURE IN DETAIL:  The patient preoperatively  received intravenous antibiotics and had sequential compression devices applied to her lower extremities.  She was then taken to the operating room where spinal anesthesia was administered and was found to be adequate. She was then placed in a dorsal supine position with a leftward tilt, and prepped and draped in a sterile manner.  A foley catheter was placed into her bladder and attached to constant gravity.  After an adequate timeout was performed, a Pfannenstiel skin incision was made with scalpel over her preexisting scar and carried through to the underlying layer of fascia. The fascia was incised in the midline, and this incision was extended bilaterally using the Mayo scissors.  Kocher clamps were applied to the superior aspect of the fascial incision and the underlying rectus muscles were dissected off sharply.  A similar process was attempted the inferior aspect of the fascial incision, but was aborted due to dense adhesions of the muscles to the fascia. The rectus muscles were separated in the midline sharply allowing entry into the peritoneal cavity as the muscles where adherent to peritoneal layer.  Omental adhesions were lysed and some fundal serosal adhesions were also lysed. Attention was turned to the lower uterine segment where a low transverse hysterotomy was made with a scalpel and extended bilaterally bluntly.  The infant was successfully delivered, the cord was clamped and cut after one minute, and the infant was handed over to the awaiting neonatology team. Uterine massage was then administered, and the placenta delivered intact with a three-vessel cord. The uterus was then cleared of clots and debris.  The hysterotomy was closed with 0 Vicryl in a running locked fashion, and an imbricating layer was also placed with 0 Vicryl.  Figure-of-eight 0 Vicryl serosal stitches were placed to help with hemostasis.  The pelvis was cleared of all clot and debris. Hemostasis was confirmed on all surfaces.   The  peritoneum and the rectus muscles were reapproximated using 2-0 Vicryl interrupted stitches. The fascia was then closed using 0 PDS in a running fashion.  The subcutaneous layer was irrigated, and 30 ml of 0.5% Marcaine was injected subcutaneously around the incision.  The skin was closed with a 4-0 Vicryl subcuticular stitch. The patient tolerated the procedure well. Sponge, lap, instrument and needle counts were correct x 3.  She was taken to the recovery room in stable condition.    Jaynie Collins, MD, FACOG Attending Obstetrician & Gynecologist Faculty Practice, [facility]

## 2017-01-07 NOTE — Anesthesia Preprocedure Evaluation (Signed)
Anesthesia Evaluation  Patient identified by MRN, date of birth, ID band Patient awake    Reviewed: Allergy & Precautions, H&P , NPO status , Patient's Chart, lab work & pertinent test results  Airway Mallampati: II   Neck ROM: full    Dental   Pulmonary asthma , former smoker,    breath sounds clear to auscultation       Cardiovascular negative cardio ROS   Rhythm:regular Rate:Normal     Neuro/Psych    GI/Hepatic   Endo/Other    Renal/GU      Musculoskeletal   Abdominal   Peds  Hematology   Anesthesia Other Findings   Reproductive/Obstetrics (+) Pregnancy H/o prior C/S                             Anesthesia Physical Anesthesia Plan  ASA: II  Anesthesia Plan: Spinal   Post-op Pain Management:    Induction: Intravenous  PONV Risk Score and Plan: 2 and Ondansetron and Dexamethasone  Airway Management Planned: Nasal Cannula  Additional Equipment:   Intra-op Plan:   Post-operative Plan:   Informed Consent: I have reviewed the patients History and Physical, chart, labs and discussed the procedure including the risks, benefits and alternatives for the proposed anesthesia with the patient or authorized representative who has indicated his/her understanding and acceptance.     Plan Discussed with: CRNA, Anesthesiologist and Surgeon  Anesthesia Plan Comments:         Anesthesia Quick Evaluation

## 2017-01-07 NOTE — MAU Note (Addendum)
Patient presents to MAU with c/o contractions every 10 minutes. Patient states she has been having contractions since 2130 last night, with bloody show present. Denies LOF. +FM. Plans Repeat C/S. CE in office on Thurs 1/50/-3.

## 2017-01-08 LAB — COMPREHENSIVE METABOLIC PANEL
ALBUMIN: 2.8 g/dL — AB (ref 3.5–5.0)
ALT: 9 U/L — ABNORMAL LOW (ref 14–54)
ANION GAP: 5 (ref 5–15)
AST: 26 U/L (ref 15–41)
Alkaline Phosphatase: 220 U/L — ABNORMAL HIGH (ref 38–126)
BILIRUBIN TOTAL: 0.2 mg/dL — AB (ref 0.3–1.2)
BUN: 9 mg/dL (ref 6–20)
CHLORIDE: 104 mmol/L (ref 101–111)
CO2: 25 mmol/L (ref 22–32)
Calcium: 8.7 mg/dL — ABNORMAL LOW (ref 8.9–10.3)
Creatinine, Ser: 0.74 mg/dL (ref 0.44–1.00)
GFR calc Af Amer: >60 mL/min (ref >60)
GFR calc non Af Amer: >60 mL/min (ref >60)
GLUCOSE: 108 mg/dL — AB (ref 65–99)
POTASSIUM: 4.1 mmol/L (ref 3.5–5.1)
Sodium: 134 mmol/L — ABNORMAL LOW (ref 135–145)
TOTAL PROTEIN: 6 g/dL — AB (ref 6.5–8.1)

## 2017-01-08 LAB — CBC
HEMATOCRIT: 29.2 % — AB (ref 36.0–46.0)
HEMOGLOBIN: 9.5 g/dL — AB (ref 12.0–15.0)
MCH: 25.6 pg — ABNORMAL LOW (ref 26.0–34.0)
MCHC: 32.5 g/dL (ref 30.0–36.0)
MCV: 78.7 fL (ref 78.0–100.0)
Platelets: 201 10*3/uL (ref 150–400)
RBC: 3.71 MIL/uL — ABNORMAL LOW (ref 3.87–5.11)
RDW: 13.9 % (ref 11.5–15.5)
WBC: 9.1 10*3/uL (ref 4.0–10.5)

## 2017-01-08 LAB — BIRTH TISSUE RECOVERY COLLECTION (PLACENTA DONATION)

## 2017-01-08 MED ORDER — AMLODIPINE BESYLATE 10 MG PO TABS
10.0000 mg | ORAL_TABLET | Freq: Every day | ORAL | Status: DC
  Administered 2017-01-08 – 2017-01-10 (×3): 10 mg via ORAL
  Filled 2017-01-08 (×4): qty 1

## 2017-01-08 NOTE — Lactation Note (Signed)
This note was copied from a baby's chart. Lactation Consultation Note  Patient Name: Julia Cleda MccreedySophia Barbeau WUJWJ'XToday's Date: 01/08/2017 Reason for consult: Follow-up assessment   Follow up with mom. Mom with 6 cc EBM at bedside. She reports she obtained milk from left breast only. Discussed this can be normal at this stage. Infant was bottle fed the EBM and tolerated it well.   Enc mom to follow feeding plan today and to call for assistance as needed. Breast shells given with instructions for use and cleaning. Enc mom to call out for feeding assistance as needed.   Reports and plan of care to Margie BilletKris Gibson, RN.    Maternal Data Formula Feeding for Exclusion: No Has patient been taught Hand Expression?: Yes Does the patient have breastfeeding experience prior to this delivery?: Yes  Feeding Feeding Type: Breast Milk Length of feed: 20 min  LATCH Score/Interventions Latch: Repeated attempts needed to sustain latch, nipple held in mouth throughout feeding, stimulation needed to elicit sucking reflex. Intervention(s): Adjust position;Assist with latch;Breast massage;Breast compression  Audible Swallowing: A few with stimulation Intervention(s): Skin to skin;Hand expression Intervention(s): Skin to skin;Hand expression;Alternate breast massage  Type of Nipple: Everted at rest and after stimulation (short shaft, evert more with stimulation) Intervention(s): Reverse pressure;Shells;Double electric pump  Comfort (Breast/Nipple): Filling, red/small blisters or bruises, mild/mod discomfort  Problem noted: Mild/Moderate discomfort  Hold (Positioning): Assistance needed to correctly position infant at breast and maintain latch. Intervention(s): Breastfeeding basics reviewed;Support Pillows;Position options;Skin to skin  LATCH Score: 6  Lactation Tools Discussed/Used Tools: Shells Shell Type: Inverted Breast pump type: Double-Electric Breast Pump Pump Review: Setup, frequency, and  cleaning;Milk Storage Initiated by:: Reviewed and encouraged every 3 hours   Consult Status Consult Status: Follow-up Date: 01/09/17 Follow-up type: In-patient    Silas FloodSharon S Kearra Calkin 01/08/2017, 12:29 PM

## 2017-01-08 NOTE — Lactation Note (Signed)
This note was copied from a baby's chart. Lactation Consultation Note  Patient Name: Boy Julia Bautista ZOXWR'UToday's Date: 01/08/2017 Reason for consult: Follow-up assessment;Other (Comment) (Early Term infant)   Follow up consult with mom of 6729 hour old infant. RN reports MD wanted infant evaluated for formula supplementation. Infant is an early term infant.   Mom reports he was up all night last night. Infant with 6 BF for 10-60 minutes, EBM x 2 3-7 ml vis syringe, 2 voids and 1 stool in last 24 hours. LATCH score 8. Infant weight 6 lb 6.1 oz with 2% weigh loss since birth.   Infant was awake and mom had him latched to left breast in the football hold. Infant was intermittently suckling and mom was puling back on breast to give infant breathing space. Enc mom to give breathing space using positioning. He fell asleep at the breast easily. He was noted to have a few swallows. Mom reports nipple tenderness with latch that improved with feeding. Her nipple was compressed after some of the latches. We discussed importance of deep latch for milk transfer and maternal comfort. Nipple tissue is intact, mom is using coconut oil to nipples.   Mom with large semi compressible breasts and areola with short shaft semi flat nipples that evert more with stimulation. She is noted to have breast edema and tissue is shiny. Hand expressed and obtained 2 cc colostrum that was spoon fed to infant. Mom reports she has been syringe feeding infant but feels like it is not working for her. We discussed options of spoon feeding vs bottle feeding. After watching spoon feeding, mom reports she would prefer to bottle feed infant EBM.   Infant then relatched to right breast in the cross cradle hold for 10 minutes. Mom reports pain with initial latch that improved with feeding. He was noted to have intermittent swallows. Infant with more swallows with breast compression. He was still feeding when LC left room. Enc mom to continue to  compress breast with feeding.   Enc mom to feed infant at least every 3 hours and with feeding cues. Enc mom to awaken infant to feed if he is not awakening to feed. Infant was very awake and active today during visit.   Discussed with mom that since infant is early term and having difficulty sustaining latch for a good feeding that I would recommend that she pump after every BF at least every 2-3 hours  and offer infant all EBM that is obtained via pumping and hand expression. Discussed that if infant not feeding well and not enough EBM is obtained infant may need formula. Mom reports she used formula with her daughter and did not like it. Reviewed pumping schedule and breast milk storage at room temperature.   Mom is planning to pump and LC will return to reassess.   Plan written on board:  Breast feed infant at least every 3 hours and with feeding cues for as long as he wishes Supplement with EBM via bottle Pump for 15 minutes on Initiate setting Hand express post pumping  Follow up later today.    Maternal Data Formula Feeding for Exclusion: No Has patient been taught Hand Expression?: Yes Does the patient have breastfeeding experience prior to this delivery?: Yes  Feeding Feeding Type: Breast Fed Length of feed: 20 min  LATCH Score/Interventions Latch: Repeated attempts needed to sustain latch, nipple held in mouth throughout feeding, stimulation needed to elicit sucking reflex. Intervention(s): Adjust position;Assist with latch;Breast massage;Breast  compression  Audible Swallowing: A few with stimulation Intervention(s): Skin to skin;Hand expression Intervention(s): Skin to skin;Hand expression;Alternate breast massage  Type of Nipple: Everted at rest and after stimulation (short shaft, evert more with stimulation) Intervention(s): Reverse pressure;Shells;Double electric pump  Comfort (Breast/Nipple): Filling, red/small blisters or bruises, mild/mod discomfort  Problem  noted: Mild/Moderate discomfort  Hold (Positioning): Assistance needed to correctly position infant at breast and maintain latch. Intervention(s): Breastfeeding basics reviewed;Support Pillows;Position options;Skin to skin  LATCH Score: 6  Lactation Tools Discussed/Used Tools: Pump Breast pump type: Double-Electric Breast Pump Pump Review: Setup, frequency, and cleaning;Milk Storage Initiated by:: Reviewed and encouraged every 3 hours   Consult Status Consult Status: Follow-up Date: 01/08/17 Follow-up type: In-patient    Julia Bautista Julia Bautista 01/08/2017, 11:16 AM

## 2017-01-08 NOTE — Progress Notes (Signed)
Subjective: Postpartum Day #1: Cesarean Delivery Patient reports incisional pain, tolerating PO, + flatus, + BM and no problems voiding.    Objective: Vital signs in last 24 hours: Temp:  [97.7 F (36.5 C)-98.9 F (37.2 C)] 98.9 F (37.2 C) (06/19 0619) Pulse Rate:  [66-78] 72 (06/19 0645) Resp:  [17-20] 18 (06/19 0645) BP: (114-147)/(75-95) 147/89 (06/19 0645) SpO2:  [99 %-100 %] 99 % (06/19 0645)  Physical Exam:  General: alert, cooperative and no distress Lochia: appropriate Uterine Fundus: firm Incision: no significant drainage, no dehiscence, no significant erythema DVT Evaluation: No evidence of DVT seen on physical exam. No cords or calf tenderness. No significant calf/ankle edema.   Recent Labs  01/07/17 0314 01/08/17 0515  HGB 10.8* 9.5*  HCT 33.0* 29.2*    Assessment/Plan: Status post Cesarean section. Doing well postoperatively. Anemia: asymptomatic, on iron.  Continue current care.  Roe Coombsachelle A Markice Torbert, CNM 01/08/2017, 7:52 AM

## 2017-01-08 NOTE — Progress Notes (Signed)
Dr Cyril LoosenSchnek notified at 2015 of intermittent high blood pressures with systolic in 140's; orders given to give 10mg  of Norvasc tonight and continue in the AM of 10mg  per day. Will continue to monitor.

## 2017-01-09 MED ORDER — DIPHENHYDRAMINE HCL 50 MG/ML IJ SOLN: 25.0000 mg | Freq: Once | INTRAMUSCULAR | Status: DC

## 2017-01-09 MED ORDER — SODIUM CHLORIDE 0.9 % IV SOLN: INTRAVENOUS | Status: DC

## 2017-01-09 MED ORDER — DEXAMETHASONE SODIUM PHOSPHATE 10 MG/ML IJ SOLN
10.0000 mg | Freq: Once | INTRAMUSCULAR | Status: DC
  Filled 2017-01-09: qty 1

## 2017-01-09 MED ORDER — METOCLOPRAMIDE HCL 5 MG/ML IJ SOLN: 10.0000 mg | Freq: Once | INTRAMUSCULAR | Status: DC

## 2017-01-09 MED ORDER — BUTALBITAL-APAP-CAFFEINE 50-325-40 MG PO TABS
2.0000 | ORAL_TABLET | Freq: Once | ORAL | Status: AC
  Administered 2017-01-09: 2 via ORAL
  Filled 2017-01-09: qty 2

## 2017-01-09 NOTE — Progress Notes (Signed)
Pt c/o persistent headache unrelieved by medication. Had tylenol at 1526, motrin and dilaudid at 1806. Still rating pain 8/10. VSS, no S&S PIH. Dr. Mosetta PuttFeng notified. Order placed per MD. Oncoming RN notified.

## 2017-01-09 NOTE — Progress Notes (Signed)
Subjective: Postpartum Day 2: Cesarean Delivery (RLTCS) Patient reports incisional pain, tolerating PO, + flatus, + BM and no problems voiding.    Objective: Vital signs in last 24 hours: Temp:  [97.9 F (36.6 C)-98.4 F (36.9 C)] 98.2 F (36.8 C) (06/20 0500) Pulse Rate:  [61-74] 61 (06/20 0500) Resp:  [18] 18 (06/20 0500) BP: (124-142)/(76-89) 126/76 (06/20 0500)  Physical Exam:  General: alert, appears stated age and no distress Lochia: appropriate Uterine Fundus: firm Incision: healing well, no significant drainage, no dehiscence, no significant erythema DVT Evaluation: No evidence of DVT seen on physical exam.   Recent Labs  01/07/17 0314 01/08/17 0515  HGB 10.8* 9.5*  HCT 33.0* 29.2*    Assessment/Plan: Status post Cesarean section. Doing well postoperatively.  Anemia: asymptomatic, on iron.  PreE without severe features  PP HTN: on amlodipine 10 mg daily, BPs have normalized on this regimen Continue current care.  Al CorpusMatthew R Allyson Tineo 01/09/2017, 7:36 AM

## 2017-01-09 NOTE — Plan of Care (Signed)
Problem: Nutritional: Goal: Mothers verbalization of comfort with breastfeeding process will improve Outcome: Progressing Pt's breasts filling, starting to become engorged. Encouraged frequent nursing, post-pumping, ice to breasts.

## 2017-01-09 NOTE — Lactation Note (Signed)
This note was copied from a baby's chart. Lactation Consultation Note  Patient Name: Julia Bautista: 01/09/2017 Reason for consult: Follow-up assessment  Baby 53 hours old. Mom reports that baby's mouth tight while he is at the breast, and she is having some pain while nursing. Mom able to latch baby to left breast in football position and baby latched deeply and suckled rhythmically with intermittent swallows noted. Mom's nipple remained perfectly round, and mom reports no pinching of the nipple. However, mom states that the baby has a really strong suckle and it causes some discomfort. Mom's breasts are filling, and there is no nipple trauma noted. Mom reports she is using coconut oil. Enc mom to use EBM and given comfort gels with review. Discussed elasticity of nipple and progression of milk coming to volume. Reviewed engorgement prevention/treatment, and mom aware of OP/BFSG and LC phone line assistance after D/C. Mom states that she has a DEBP at home as well. Enc mom to call for assistance if discomfort with latching continues.   Maternal Data    Feeding Feeding Type: Breast Fed Length of feed:  (LC assessed first 10 minutes of BF. )  LATCH Score/Interventions Latch: Repeated attempts needed to sustain latch, nipple held in mouth throughout feeding, stimulation needed to elicit sucking reflex. Intervention(s): Adjust position;Assist with latch;Breast compression  Audible Swallowing: Spontaneous and intermittent Intervention(s): Skin to skin;Hand expression  Type of Nipple: Everted at rest and after stimulation  Comfort (Breast/Nipple): Filling, red/small blisters or bruises, mild/mod discomfort  Problem noted: Mild/Moderate discomfort Interventions (Mild/moderate discomfort): Comfort gels;Post-pump  Hold (Positioning): No assistance needed to correctly position infant at breast. Intervention(s): Breastfeeding basics reviewed;Position options  LATCH Score:  8  Lactation Tools Discussed/Used Tools: Shells;Pump Breast pump type: Double-Electric Breast Pump   Consult Status Consult Status: Complete    Sherlyn HayJennifer D Janin Kozlowski 01/09/2017, 10:27 AM

## 2017-01-09 NOTE — Progress Notes (Signed)
Post Partum Day 2 Subjective: CC headache x 1 hour. complains of a significant headache 8/10. did not respond to percocet. vision changes: none. Pt did have headache resolve with Fiorinal when rechecked at 2 am.   Objective: Blood pressure 114/76, pulse 88, temperature 98.6 F (37 C), temperature source Oral, resp. rate 18, height 5\' 3"  (1.6 m), last menstrual period 04/21/2016, SpO2 99 %, unknown if currently breastfeeding.  Physical Exam:  General: alert, cooperative, appears stated age and no distress Lochia: appropriate Uterine Fundus:  Incision:  DVT Evaluation:    Recent Labs  01/07/17 0314 01/08/17 0515  HGB 10.8* 9.5*  HCT 33.0* 29.2*    Assessment/Plan: Resolved headache.  Routine postop care. Probable d/c  6/21   LOS: 2 days   Glennette Galster V 01/09/2017, 9:08 PM

## 2017-01-10 ENCOUNTER — Encounter (HOSPITAL_COMMUNITY): Payer: Self-pay | Admitting: Anesthesiology

## 2017-01-10 ENCOUNTER — Encounter: Payer: Medicaid Other | Admitting: Family Medicine

## 2017-01-10 MED ORDER — AMLODIPINE BESYLATE 10 MG PO TABS: 10.0000 mg | ORAL_TABLET | Freq: Every day | ORAL | 6 refills | Status: DC

## 2017-01-10 MED ORDER — IBUPROFEN 600 MG PO TABS: 600.0000 mg | ORAL_TABLET | Freq: Four times a day (QID) | ORAL | 0 refills | Status: DC

## 2017-01-10 MED ORDER — HYDROMORPHONE HCL 2 MG PO TABS: 2.0000 mg | ORAL_TABLET | ORAL | 0 refills | Status: DC | PRN

## 2017-01-10 MED ORDER — BUTALBITAL-APAP-CAFFEINE 50-325-40 MG PO TABS
1.0000 | ORAL_TABLET | Freq: Once | ORAL | Status: AC
  Administered 2017-01-10: 1 via ORAL
  Filled 2017-01-10: qty 1

## 2017-01-10 NOTE — Progress Notes (Signed)
Called Dr. Mosetta PuttFeng. Pt still c/o H/A 9/10 unrelieved from fioricet. Per Dr. Mosetta PuttFeng give ibuprofen.

## 2017-01-10 NOTE — Discharge Instructions (Signed)

## 2017-01-10 NOTE — Progress Notes (Signed)
Pt c/o H/A 9/10, similar to H/A she had yesterday that was relieved with fioricet. BP 133/80, no S&S PIH. Luna KitchensKathryn Kooistra CNM called and order received.

## 2017-01-10 NOTE — Progress Notes (Signed)
Called Dr. Mosetta PuttFeng regarding pt. Pt c/o H/A still 9/10 unrelieved by fioricet. VSS reviewed. Per Dr. Mosetta PuttFeng, give motrin now.

## 2017-01-10 NOTE — Progress Notes (Signed)
Notified Dr. Mosetta PuttFeng that pt states she uses a different pharmacy than the one listed on her discharge instructions. Instructions have [facility] [address], pt uses [facility] on [address]. Dr. Mosetta PuttFeng to change pharmacy.

## 2017-01-10 NOTE — Lactation Note (Signed)
This note was copied from a baby's chart. Lactation Consultation Note  Patient Name: Julia Bautista Reason for consult: Follow-up assessment;Other (Comment) (early terms 37.2) Mom managing engorgement well. BF frequently and pumping to comfort, advised to use ice packs. Advised Mom to stop wearing breast shell, LC notes swelling at base of nipples from shells. Advised to change to 16 nipple shield on right breast, use 20 nipple shield on left breast for better fit. Baby nursed on right breast this visit using 16 nipple shield and breast milk visible in nipple shield, breast softening, lots of swallows. Baby satiated at end of feeding. Baby has gained weight per last weight check. Good output. Mom post pumping to comfort. OP f/u scheduled for Tuesday, 01/15/17 at 10:00. Advised baby should be at breast 8-12 times in 24 hours. Call for questions/concerns.   Maternal Data    Feeding Feeding Type: Breast Fed Length of feed: 20 min  LATCH Score/Interventions Latch: Grasps breast easily, tongue down, lips flanged, rhythmical sucking. (using 16 nipple shield) Intervention(s): Breast compression;Breast massage  Audible Swallowing: Spontaneous and intermittent Intervention(s): Skin to skin;Hand expression  Type of Nipple: Everted at rest and after stimulation Intervention(s): Double electric pump;Shells  Comfort (Breast/Nipple): Engorged, cracked, bleeding, large blisters, severe discomfort Problem noted: Engorgment Intervention(s): Ice;Hand expression  Problem noted: Mild/Moderate discomfort Interventions (Filling): Massage;Frequent nursing;Double electric pump Interventions (Mild/moderate discomfort): Hand massage;Hand expression;Post-pump;Pre-pump if needed Interventions (Severe discomfort): Double electric pum  Hold (Positioning): No assistance needed to correctly position infant at breast. Intervention(s): Skin to skin;Support Pillows;Breastfeeding basics  reviewed  LATCH Score: 8  Lactation Tools Discussed/Used Tools: Nipple Dorris CarnesShields;Pump Nipple shield size: 16 Breast pump type: Double-Electric Breast Pump   Consult Status Consult Status: Complete Date: 01/10/17 Follow-up type: In-patient    Alfred LevinsGranger, [name], 12:53 PM

## 2017-01-10 NOTE — Progress Notes (Signed)
Called Dr. Arby BarretteHatchett. Pt c/o "nerves twitching in my spine" described as lower back. Also mentioned H/A pt has been having. Pt describes headache as the same with any position. Per Dr. Arby BarretteHatchett, these are not related to anesthesia.

## 2017-01-10 NOTE — Discharge Summary (Signed)
OB Discharge Summary  Patient Name: Julia DowdySophia Bautista Julia Bautista DOB: May 09, 1981 MRN: [id number]  Date of admission: 01/07/2017 Delivering MD: Jaynie CollinsANYANWU, UGONNA A   Date of discharge: 01/10/2017  Admitting diagnosis: 37 WEEKS CTX rcs Intrauterine pregnancy: 5255w2d     Secondary diagnosis:Principal Problem:   Hypertension in pregnancy, preeclampsia, delivered Active Problems:   S/P repeat cesarean section  Additional problems: morbid obesity     Discharge diagnosis: Preterm Pregnancy Delivered                                                                     Post partum procedures:initiation of norvasc 10 mg daily for BP    Complications: None  Hospital course:  Onset of Labor With Unplanned C/S  36 y.o. yo [id number] at 2555w2d was admitted in Latent Labor on 01/07/2017. Patient had a labor course significant for none. Membrane Rupture Time/Date: 4:34 AM ,01/07/2017   The patient went for cesarean section due to Elective Repeat, and delivered a Viable infant,01/07/2017  Details of operation can be found in separate operative note. Patient had an uncomplicated postpartum course.  She is ambulating,tolerating a regular diet, passing flatus, and urinating well.  Patient is discharged home in stable condition 01/10/17.  Physical exam  Vitals:   01/09/17 1018 01/09/17 1700 01/10/17 0603 01/10/17 0854  BP: 131/72 114/76 132/84 133/80  Pulse: 78 88 67 82  Resp:  18 18   Temp:  98.6 F (37 C) 98.6 F (37 C)   TempSrc:  Oral Oral   SpO2:      Height:       General: alert Lochia: appropriate Uterine Fundus: firm Incision: Dressing is clean, dry, and intact DVT Evaluation: No evidence of DVT seen on physical exam, reflexes 2+ and equal bilaterally Labs: Lab Results  Component Value Date   WBC 9.1 01/08/2017   HGB 9.5 (Bautista) 01/08/2017   HCT 29.2 (Bautista) 01/08/2017   MCV 78.7 01/08/2017   PLT 201 01/08/2017   CMP Latest Ref Rng & Units 01/08/2017  Glucose 65 - 99 mg/dL 147(W108(H)  BUN 6 - 20  mg/dL 9  Creatinine 2.950.44 - 6.211.00 mg/dL 3.080.74  Sodium 657135 - 846145 mmol/Bautista 134(Bautista)  Potassium 3.5 - 5.1 mmol/Bautista 4.1  Chloride 101 - 111 mmol/Bautista 104  CO2 22 - 32 mmol/Bautista 25  Calcium 8.9 - 10.3 mg/dL 9.6(E8.7(Bautista)  Total Protein 6.5 - 8.1 g/dL 6.0(Bautista)  Total Bilirubin 0.3 - 1.2 mg/dL 9.5(M0.2(Bautista)  Alkaline Phos 38 - 126 U/Bautista 220(H)  AST 15 - 41 U/Bautista 26  ALT 14 - 54 U/Bautista 9(Bautista)    Discharge instruction: per After Visit Summary and "Baby and Me Booklet".  After Visit Meds:  Allergies as of 01/10/2017      Reactions   Darvon [propoxyphene] Itching   Percocet [oxycodone-acetaminophen] Hives   Sulfa Drugs Cross Reactors Hives      Medication List    TAKE these medications   acetaminophen 500 MG tablet Commonly known as:  TYLENOL Take 500 mg by mouth every 6 (six) hours as needed for moderate pain.   albuterol 108 (90 Base) MCG/ACT inhaler Commonly known as:  PROVENTIL HFA;VENTOLIN HFA Inhale 2 puffs into the lungs every 6 (six) hours as needed for  wheezing or shortness of breath. Reported on 01/25/2016   amLODipine 10 MG tablet Commonly known as:  NORVASC Take 1 tablet (10 mg total) by mouth daily. Start taking on:  01/11/2017   HYDROmorphone 2 MG tablet Commonly known as:  DILAUDID Take 1 tablet (2 mg total) by mouth every 4 (four) hours as needed for severe pain.   ibuprofen 600 MG tablet Commonly known as:  ADVIL,MOTRIN Take 1 tablet (600 mg total) by mouth every 6 (six) hours.   prenatal multivitamin Tabs tablet Take 1 tablet by mouth daily at 12 noon.   valACYclovir 500 MG tablet Commonly known as:  VALTREX Take 1 tablet (500 mg total) by mouth daily.       Diet: routine diet  Activity: Advance as tolerated. Pelvic rest for 6 weeks.   Outpatient follow up:1 week for BP check Follow up Appt:Future Appointments Date Time Provider Department Center  02/14/2017 1:40 PM Marylene Land, CNM [facility]   Follow up visit: No Follow-up on file.  Postpartum contraception:  Condoms  Newborn Data: Live born female  Birth Weight: 6 lb 8.6 oz (2965 g) APGAR: 9, 9  Baby Feeding: Breast Disposition:home with mother   01/10/2017 Julia Bossier, MD

## 2017-01-10 NOTE — Anesthesia Postprocedure Evaluation (Signed)
Anesthesia Post Note  Patient: Julia DowdySophia L Bautista  Procedure(s) Performed: * No procedures listed *     Patient location during evaluation: Mother Baby Anesthesia Type: Spinal Level of consciousness: awake and alert Pain management: pain level controlled Vital Signs Assessment: post-procedure vital signs reviewed and stable Respiratory status: spontaneous breathing Cardiovascular status: stable Postop Assessment: no headache, no backache, patient able to bend at knees, no signs of nausea or vomiting and adequate PO intake Anesthetic complications: no    Last Vitals:  Vitals:   01/10/17 0603 01/10/17 0854  BP: 132/84 133/80  Pulse: 67 82  Resp: 18   Temp: 37 C     Last Pain:  Vitals:   01/10/17 1213  TempSrc:   PainSc: 4    Pain Goal: Patients Stated Pain Goal: 3 (01/10/17 1213)               Dilan Novosad

## 2017-01-10 NOTE — Lactation Note (Signed)
This note was copied from a baby's chart. Lactation Consultation Note RN called LC d/t mom engorged. Breast hard w/knots. C/o Lt. Nipple pain, burning. Mom has short shaft nipples. Baby bf. LC massaged Rt. Breast, started DEBP to RT. Breast. emptying well. Baby relieved slightly Lt. Breast. Pumped and massaged Lt. Breast. Noted nipple lipstick shaped. Fitted mom w/#16 and #20. D/t tender, #20 felt better per mom as well as the #27 flange. Mom has small nipples, LC thought #24 flanges would work but mom stated they hurt. Massaged while pumping, noted softening some. Baby cued again. Put baby to breast #20 NS. Baby BF great, noted improvement in breast. Laid mom back some in bed, applied ice wearing supportive bra. Has comfort ges for sore nipples. Skin intact, bruised. Encouraged mom to pump again in 2 hours. Patient Name: Julia Cleda MccreedySophia Bautista FAOZH'YToday's Date: 01/10/2017 Reason for consult: Follow-up assessment;Breast/nipple pain   Maternal Data    Feeding Feeding Type: Breast Fed Length of feed: 40 min  LATCH Score/Interventions Latch: Grasps breast easily, tongue down, lips flanged, rhythmical sucking. Intervention(s): Adjust position;Assist with latch;Breast massage;Breast compression  Audible Swallowing: Spontaneous and intermittent Intervention(s): Hand expression;Skin to skin Intervention(s): Alternate breast massage  Type of Nipple: Everted at rest and after stimulation Intervention(s): Double electric pump  Comfort (Breast/Nipple): Engorged, cracked, bleeding, large blisters, severe discomfort Problem noted: Engorgment Intervention(s): Ice;Hand expression  Problem noted: Severe discomfort Interventions (Filling): Massage;Firm support;Frequent nursing;Double electric pump;Hand pump Interventions (Mild/moderate discomfort): Breast shields;Comfort gels;Post-pump;Pre-pump if needed;Hand expression;Hand massage;Reverse pressue Interventions (Severe discomfort): Double electric  pum  Hold (Positioning): Full assist, staff holds infant at breast Intervention(s): Breastfeeding basics reviewed;Support Pillows;Position options;Skin to skin  LATCH Score: 6  Lactation Tools Discussed/Used Tools: Nipple Dorris CarnesShields;Pump Nipple shield size: 20;16 Shell Type: Inverted Breast pump type: Double-Electric Breast Pump Pump Review: Setup, frequency, and cleaning;Milk Storage Initiated by:: RN Date initiated:: 01/09/17   Consult Status Consult Status: Follow-up Date: 01/10/17 Follow-up type: In-patient    Julia Bautista, Diamond NickelLAURA G 01/10/2017, 7:08 AM

## 2017-01-15 ENCOUNTER — Ambulatory Visit: Payer: Self-pay

## 2017-01-15 NOTE — Lactation Note (Signed)
This note was copied from a baby's chart. Lactation Consult  Mother's reason for visit:  Get more bf information and latch help  Visit Type:  OP Consult:  Follow-Up Lactation Consultant:  Rulon EisenmengerElizabeth E Lakara Weiland  ________________________________________________________________________ Birth wt 6# 8.0 Lowest wt on 01/08/17 6# 5.0 D/C wt on 01/10/17 6# 8.0 Home RN on 01/14/17 6# 10.0  ________________________________________________________________________  Mother's Name: Cleda MccreedySophia Dozier Type of delivery:  C-Section, Low Transverse Breastfeeding Experience:  1 month 13 yr ago Maternal Medical Conditions:  none Maternal Medications:  Motrin, Dilaudid    ________________________________________________________________________  Breastfeeding History (Post Discharge)  Frequency of breastfeeding:  q3hr Duration of feeding:  20-30 minutes  Supplementation  Formula:  No formula   Breastmilk:  Volume 60-90 ml  Frequency:  1-2x/24hr  Total volume per day:  120-180 ml  Method:  Bottle,   Pumping  Type of pump:  Avent DEBP Frequency:  2x/24hr Volume:  60-90 ml total from both breast   Infant Intake and Output Assessment  Voids:   6 in 24 hrs.  Color:  Clear yellow Stools:  6  in 24 hrs.  Color:  dark yellow to orange   ________________________________________________________________________  Maternal Breast Assessment  Breast:  Full Nipple:  Erect Pain level:  0 Pain interventions:  Cold packs and Nipple shield  _______________________________________________________________________ Feeding Assessment/Evaluation  Initial feeding assessment:  Infant's oral assessment: Upper and lower labial along with posterior lingual frenulum noted.   Positioning:  Football Right breast  Attached assessment:  Shallow  Lips flanged:  No.  Lips untucked:  No.  Suck assessment:  Nutritive  Tools:  Nipple shield 16 mm Instructed on use and cleaning of tool:  Yes.    Pre-feed weight:   3024 g   Post-feed weight:  3056 g  Amount transferred:  32 ml Amount supplemented:  0 ml Feeding lasted 5 minutes    Positioning:  Football Left breast  Attached assessment:  Shallow  Lips flanged:  No.  Lips untucked:  Yes.    Suck assessment:  Nutritive  Tools:  Nipple shield 16 mm Instructed on use and cleaning of tool:  Yes.    Pre-feed weight:  3056 g  Post-feed weight:  3070 g  Amount transferred:  14 ml Amount supplemented:  0 ml Feeding lasted 4 minutes on and off    Positioning:  Football Right breast  Attached assessment:  Deep  Lips flanged:  No.  Lips untucked:  No.  Suck assessment:  Nutritive  Tools:  Nipple shield 16 mm Instructed on use and cleaning of tool:  Yes.    Pre-feed weight:  3070 g   Post-feed weight:  3082 g  Amount transferred:  12 ml Amount supplemented:  0 ml Feeding lasted 5 minutes on and off  Baby keeps lips tucked in and prefers a shallow latch. Mom's breast are full and leaking before she ever latches baby. At times baby struggles to keep up with mom's let down. He will pull back, cough, and the corner of his mouth leaks. Mom prefers to latch baby in football position. Showed her how to support baby's head and get a deeper latch. Mom is using a Avent DEBP. She was able to 80 ml for the Right and 45 ml from the Left in 15 minutes. Her breast were a little softer but were still leaking after she pumped. Reviewed changing baby's position when feeding to drain all areas of the breast, pre massage breast before pumping, and doing hands on  pumping. Reviewed engorgement, plugged duct, and mastitis symptoms. Mom will pre pump before every feeding and store the milk for later. She will continue using the NS and getting that deep latch every time. She will come back for a f/u visit with lactation on 01/29/17.    Total amount transferred:  58 ml Total supplement given:  0 ml

## 2017-01-16 ENCOUNTER — Encounter: Payer: Medicaid Other | Admitting: Student

## 2017-01-17 ENCOUNTER — Ambulatory Visit: Payer: Medicaid Other

## 2017-01-17 VITALS — BP 136/87 | HR 71

## 2017-01-17 DIAGNOSIS — Z0130 Encounter for examination of blood pressure without abnormal findings: Secondary | ICD-10-CM

## 2017-01-17 NOTE — Progress Notes (Signed)
Patient presented to the office for a blood pressure check. Patient is recently delivered and provider recommend she come in for a blood pressure check. Patients reports not taking her blood pressure medication as instructed. Advised patient her blood pressure medication

## 2017-01-18 ENCOUNTER — Encounter (HOSPITAL_COMMUNITY)
Admission: RE | Admit: 2017-01-18 | Discharge: 2017-01-18 | Disposition: A | Discharge status: Home / Self Care | Payer: Medicaid Other | Source: Ambulatory Visit

## 2017-01-20 ENCOUNTER — Inpatient Hospital Stay (HOSPITAL_COMMUNITY): Admit: 2017-01-20 | Payer: Medicaid Other | Admitting: Obstetrics & Gynecology

## 2017-01-24 ENCOUNTER — Other Ambulatory Visit (HOSPITAL_COMMUNITY)
Admission: RE | Admit: 2017-01-24 | Discharge: 2017-01-24 | Disposition: A | Discharge status: Home / Self Care | Payer: Medicaid Other | Source: Ambulatory Visit | Attending: Obstetrics & Gynecology | Admitting: Obstetrics & Gynecology

## 2017-01-24 ENCOUNTER — Ambulatory Visit: Payer: Medicaid Other

## 2017-01-24 VITALS — BP 124/71 | HR 71

## 2017-01-24 DIAGNOSIS — N760 Acute vaginitis: Secondary | ICD-10-CM | POA: Insufficient documentation

## 2017-01-24 DIAGNOSIS — N898 Other specified noninflammatory disorders of vagina: Secondary | ICD-10-CM | POA: Diagnosis present

## 2017-01-24 DIAGNOSIS — B9689 Other specified bacterial agents as the cause of diseases classified elsewhere: Secondary | ICD-10-CM | POA: Diagnosis not present

## 2017-01-24 NOTE — Progress Notes (Signed)
Patient presented to the office today for blood pressure check. Patient reports taking her medications as prescribed. Blood pressure is stable at this time. Patient reports having some discharge and wanted to do a self swab to make sure she does not have an yeast infection.  Patient instructed on how to complete a self swab. I have inform patient we will call her back in 1-2 days once the results come back. Patient voice understanding.

## 2017-01-25 LAB — CERVICOVAGINAL ANCILLARY ONLY
Bacterial vaginitis: POSITIVE — AB
Candida vaginitis: NEGATIVE
TRICH (WINDOWPATH): NEGATIVE

## 2017-01-28 ENCOUNTER — Telehealth: Payer: Self-pay

## 2017-01-28 ENCOUNTER — Other Ambulatory Visit: Payer: Self-pay | Admitting: Obstetrics & Gynecology

## 2017-01-28 DIAGNOSIS — N760 Acute vaginitis: Principal | ICD-10-CM

## 2017-01-28 DIAGNOSIS — B9689 Other specified bacterial agents as the cause of diseases classified elsewhere: Secondary | ICD-10-CM

## 2017-01-28 MED ORDER — FLUCONAZOLE 150 MG PO TABS: 150.0000 mg | ORAL_TABLET | Freq: Once | ORAL | 0 refills | Status: AC

## 2017-01-28 MED ORDER — METRONIDAZOLE 500 MG PO TABS: 500.0000 mg | ORAL_TABLET | Freq: Two times a day (BID) | ORAL | 0 refills | Status: DC

## 2017-01-28 NOTE — Telephone Encounter (Signed)
Patient called and made aware that she has bacterial vaginosis. Patient made aware that we have called in flagyl for her. Patient requested a script for diflucan because she get a yeast infection when she take flagyl. Patient verified pharmacy. Armandina StammerJennifer Baron Parmelee RNBSN

## 2017-01-29 ENCOUNTER — Ambulatory Visit: Payer: Self-pay

## 2017-01-29 NOTE — Lactation Note (Addendum)
This note was copied from a baby's chart. Lactation Consult - baby Julia Bautista,Julia Bautista, and mom Julia Bautista arrive for 11:30 am appt.  Mother's reason for visit: F/U  Per mom last weight 7/5 - 7-5 oz  Last fed at 9 am for 20 mins, now 11:30 am  Baby feeds every 2 1/2 - 3 hours , for 15 -20 mins. Wets =>6 , stools > 6 yellow  Baby receives a bottle of EBM once a day / advent nipple / tolerates well. ( 2 oz )  Visit Type: Feeding assessment / f/u  Appointment Notes:  Using a Nipple Shield / Pt. Confirmed  Consult:  Follow-Up Lactation Consultant:  Julia Bautista  ________________________________________________________________________ Julia Bautista Name:  Julia Bautista Date of Birth:  01/07/2015 Pediatrician: Vida Roller NP / Luella Cook  Gender:  female Gestational Age: [redacted]w[redacted]d (At Birth) Birth Weight:  6 lb 8.6 oz (2965 g) Weight at Discharge:                          Date of Discharge:   There were no vitals filed for this visit. Last weight taken from location [facility]: 7-5 oz , ( 7/5 )    Location:Pediatrician's office Weight today: 3472 g , 7-10.5 oz  _____________________________________________________________________  Mother's Name: Julia Bautista Type of delivery:  C-Section, Low Transverse Breastfeeding Experience: 2nd baby  Maternal Medical Conditions:  No risk  Maternal Medications:  PNV ______________________________________________________________________________________________________  Maternal Breast Assessment  Breast:  Full Nipple:  Erect Pain level:  0 Pain interventions:  Expressed breast milk  _______________________________________________________________________ Feeding Assessment/Evaluation  Initial feeding assessment:  Infant's oral assessment:  WNL  Positioning:  Football Right breast  LATCH documentation:  Latch:  2 = Grasps breast easily, tongue down, lips flanged, rhythmical sucking.  Audible swallowing:  2 = Spontaneous and  intermittent  Type of nipple:  2 = Everted at rest and after stimulation  Comfort (Breast/Nipple):  1 = Filling, red/small blisters or bruises, mild/mod discomfort  Hold (Positioning):  1 = Assistance needed to correctly position infant at breast and maintain latch  LATCH score: 8   Attached assessment:  Deep  Lips flanged:  No.  Lips untucked:  Yes.    Suck assessment:  Nutritive  Tools:  Latched without the Nipple shield   Pre-feed weight: 3472 g , 7-10.5 oz  Post-feed weight:  3522 g , 7-12.2 oz  Amount transferred:  50 ml  Amount supplemented:  None   Additional Feeding Assessment -   Infant's oral assessment:  WNL  Positioning:  Football Left breast  LATCH documentation:  Latch:  2 = Grasps breast easily, tongue down, lips flanged, rhythmical sucking.  Audible swallowing:  2 = Spontaneous and intermittent  Type of nipple:  2 = Everted at rest and after stimulation  Comfort (Breast/Nipple):  1 = Filling, red/small blisters or bruises, mild/mod discomfort  Hold (Positioning):  1 = Assistance needed to correctly position infant at breast and maintain latch  LATCH score: 8   Attached assessment:  Deep  Lips flanged:  Yes.    Lips untucked:  Yes.    Suck assessment:  Nutritive  Tools:  Nipple Shield not used for latch. Mom has been using the 16 NS at home,  So for mom's confidence and nervousness - re-sized for #20 NS in case she needs it to latch.  Baby latched well both breast with depth.  Instructed on use and cleaning of  tool:  Yes.    Wet diaper changed   Pre-feed weight: 3506 g , 7-11.7 oz  Post-feed weight: 3552 g , 7-13.3 oz  Amount transferred: 46 ml  Amount supplemented:  None needed   Total amount pumped post feed: none   Total amount transferred: 96 ml  Total supplement given:  None   Lactation Impression:  Baby is 513 weeks old and has been using a Nipple Shield for latching.  LC recommended at consult - to try latching without the NS, and baby  latched  Well both breast with depth and transferred over 3 oz off the breast / total.  Mom seemed nervous at 1st . Latching without the Nipple shield due to initially having  Sore nipples that have completely cleared up. Reviewed with mom the basics of latching  And how to obtain depth. Mom was comfortable with both latches/ nipple well rounded after  Feedings ended. And baby was satisfied.   Lactation Plan of Care:  Praised mom for her breast feeding efforts Continue to wean baby off the Nipple Shield  You have the #20 Nipple Shield if needed  Make sure prior to latching - hand express , or pre- pump with  Hand pump off fullness, ( areola needs to be like a thinner sandwich  Prior to latch to obtain depth at the breast.  Extra pumping when necessary and especially when the breast are the fullest.  Usually the breast are the fullest after am feeding, mid day, 3rd time in the afternoon.  Lc encouraged mom to consider attending the 11 am Tuesday [facility] for free weight check , and support.

## 2017-01-31 ENCOUNTER — Ambulatory Visit: Payer: Medicaid Other

## 2017-02-14 ENCOUNTER — Encounter: Payer: Self-pay | Admitting: Student

## 2017-02-14 ENCOUNTER — Ambulatory Visit (INDEPENDENT_AMBULATORY_CARE_PROVIDER_SITE_OTHER): Payer: Medicaid Other | Admitting: Student

## 2017-02-14 DIAGNOSIS — B9689 Other specified bacterial agents as the cause of diseases classified elsewhere: Secondary | ICD-10-CM

## 2017-02-14 DIAGNOSIS — N760 Acute vaginitis: Secondary | ICD-10-CM | POA: Diagnosis not present

## 2017-02-14 DIAGNOSIS — Z113 Encounter for screening for infections with a predominantly sexual mode of transmission: Secondary | ICD-10-CM

## 2017-02-14 MED ORDER — METRONIDAZOLE 500 MG PO TABS: 500.0000 mg | ORAL_TABLET | Freq: Two times a day (BID) | ORAL | 0 refills | Status: AC

## 2017-02-14 NOTE — Patient Instructions (Addendum)
-  Discontinue Norvasc and check BP in three days; if normal, [facility] -return in October 2018 for repeat pap smear   Try fiber supplements daily, take COLACE (docusate sodium) 3 times a day when necessary  Constipation, Adult Constipation is when a person has fewer bowel movements in a week than normal, has difficulty having a bowel movement, or has stools that are dry, hard, or larger than normal. Constipation may be caused by an underlying condition. It may become worse with age if a person takes certain medicines and does not take in enough fluids. Follow these instructions at home: Eating and drinking   Eat foods that have a lot of fiber, such as fresh fruits and vegetables, whole grains, and beans.  Limit foods that are high in fat, low in fiber, or overly processed, such as french fries, hamburgers, cookies, candies, and soda.  Drink enough fluid to keep your urine clear or pale yellow. General instructions  Exercise regularly or as told by your health care provider.  Go to the restroom when you have the urge to go. Do not hold it in.  Take over-the-counter and prescription medicines only as told by your health care provider. These include any fiber supplements.  Practice pelvic floor retraining exercises, such as deep breathing while relaxing the lower abdomen and pelvic floor relaxation during bowel movements.  Watch your condition for any changes.  Keep all follow-up visits as told by your health care provider. This is important. Contact a health care provider if:  You have pain that gets worse.  You have a fever.  You do not have a bowel movement after 4 days.  You vomit.  You are not hungry.  You lose weight.  You are bleeding from the anus.  You have thin, pencil-like stools. Get help right away if:  You have a fever and your symptoms suddenly get worse.  You leak stool or have blood in  your stool.  Your abdomen is bloated.  You have severe pain in your abdomen.  You feel dizzy or you faint. This information is not intended to replace advice given to you by your health care provider. Make sure you discuss any questions you have with your health care provider. Document Released: 04/06/2004 Document Revised: 01/27/2016 Document Reviewed: 12/28/2015 Elsevier Interactive Patient Education  2017 [employer]

## 2017-02-14 NOTE — Progress Notes (Signed)
Subjective:    States never got the flagyl sent in on 01/28/17, resent rx today.   Julia Bautista is a 36 y.o. female who presents for a postpartum visit. She is 5 weeks postpartum following a low cervical transverse Cesarean section. I have fully reviewed the prenatal and intrapartum course. The delivery was at 37 gestational weeks. Outcome: repeat cesarean section, low transverse incision. Anesthesia: spinal. Postpartum course has been uneventful. Baby's course has been postpartum. Baby is feeding by breast. Bleeding no bleeding. Bowel function is normal. Bladder function is normal. Patient is sexually active. Contraception method is condoms. Postpartum depression screening: negative.  The following portions of the patient's history were reviewed and updated as appropriate: allergies, current medications, past family history, past medical history, past social history, past surgical history and problem list.  Review of Systems Pertinent items are noted in HPI.   Objective:    There were no vitals taken for this visit.  General:  alert, cooperative and no distress   Breasts:  negative  Lungs: clear to auscultation bilaterally  Heart:  regular rate and rhythm, S1, S2 normal, no murmur, click, rub or gallop  Abdomen: soft, non-tender; bowel sounds normal; no masses,  no organomegaly Incision well-healed, no signs of inflammation or infection   Vulva:  not evaluated  Vagina: not evaluated  Cervix:  not evaluated  Corpus: not examined  Adnexa:  not evaluated  Rectal Exam: Not performed.        Assessment:    Normal postpartum exam. Pap smear not done at today's visit.   BP normal today on Norvasc  Plan:    1. Contraception: condoms 2. Patient will stop Norvasc and then check her blood pressure in a few days. If BP regular, D/C Norvasc. If elevated, continue Norvasc and make appointment with [facility] to manage blood pressure.  3. Follow up in: 4 month for repeat pap or as needed.

## 2017-03-20 ENCOUNTER — Emergency Department (HOSPITAL_COMMUNITY)
Admission: EM | Admit: 2017-03-20 | Discharge: 2017-03-20 | Disposition: A | Discharge status: Home / Self Care | Payer: Medicaid Other | Attending: Emergency Medicine | Admitting: Emergency Medicine

## 2017-03-20 ENCOUNTER — Encounter (HOSPITAL_COMMUNITY): Payer: Self-pay | Admitting: *Deleted

## 2017-03-20 DIAGNOSIS — R197 Diarrhea, unspecified: Secondary | ICD-10-CM | POA: Insufficient documentation

## 2017-03-20 DIAGNOSIS — Z79899 Other long term (current) drug therapy: Secondary | ICD-10-CM | POA: Insufficient documentation

## 2017-03-20 DIAGNOSIS — R1084 Generalized abdominal pain: Secondary | ICD-10-CM | POA: Diagnosis not present

## 2017-03-20 DIAGNOSIS — J45909 Unspecified asthma, uncomplicated: Secondary | ICD-10-CM | POA: Insufficient documentation

## 2017-03-20 DIAGNOSIS — R109 Unspecified abdominal pain: Secondary | ICD-10-CM | POA: Diagnosis present

## 2017-03-20 DIAGNOSIS — Z87891 Personal history of nicotine dependence: Secondary | ICD-10-CM | POA: Insufficient documentation

## 2017-03-20 LAB — CBC
HEMATOCRIT: 33.8 % — AB (ref 36.0–46.0)
Hemoglobin: 10.7 g/dL — ABNORMAL LOW (ref 12.0–15.0)
MCH: 24.6 pg — ABNORMAL LOW (ref 26.0–34.0)
MCHC: 31.7 g/dL (ref 30.0–36.0)
MCV: 77.7 fL — ABNORMAL LOW (ref 78.0–100.0)
PLATELETS: 229 10*3/uL (ref 150–400)
RBC: 4.35 MIL/uL (ref 3.87–5.11)
RDW: 13 % (ref 11.5–15.5)
WBC: 4.1 10*3/uL (ref 4.0–10.5)

## 2017-03-20 LAB — COMPREHENSIVE METABOLIC PANEL
ALBUMIN: 3.9 g/dL (ref 3.5–5.0)
ALK PHOS: 54 U/L (ref 38–126)
ALT: 18 U/L (ref 14–54)
AST: 28 U/L (ref 15–41)
Anion gap: 7 (ref 5–15)
BILIRUBIN TOTAL: 0.4 mg/dL (ref 0.3–1.2)
BUN: 5 mg/dL — AB (ref 6–20)
CALCIUM: 9 mg/dL (ref 8.9–10.3)
CO2: 24 mmol/L (ref 22–32)
CREATININE: 0.92 mg/dL (ref 0.44–1.00)
Chloride: 108 mmol/L (ref 101–111)
GFR calc Af Amer: >60 mL/min (ref >60)
GFR calc non Af Amer: >60 mL/min (ref >60)
GLUCOSE: 93 mg/dL (ref 65–99)
POTASSIUM: 3.7 mmol/L (ref 3.5–5.1)
Sodium: 139 mmol/L (ref 135–145)
TOTAL PROTEIN: 7.3 g/dL (ref 6.5–8.1)

## 2017-03-20 LAB — I-STAT BETA HCG BLOOD, ED (MC, WL, AP ONLY): I-stat hCG, quantitative: <5 m[IU]/mL (ref <5)

## 2017-03-20 LAB — URINALYSIS, ROUTINE W REFLEX MICROSCOPIC
Bilirubin Urine: NEGATIVE
Glucose, UA: NEGATIVE mg/dL
Ketones, ur: NEGATIVE mg/dL
NITRITE: NEGATIVE
PH: 5 (ref 5.0–8.0)
Protein, ur: NEGATIVE mg/dL
SPECIFIC GRAVITY, URINE: 1.017 (ref 1.005–1.030)

## 2017-03-20 LAB — LIPASE, BLOOD: Lipase: 41 U/L (ref 11–51)

## 2017-03-20 MED ORDER — SODIUM CHLORIDE 0.9 % IV BOLUS (SEPSIS)
1000.0000 mL | Freq: Once | INTRAVENOUS | Status: AC
  Administered 2017-03-20: 1000 mL via INTRAVENOUS

## 2017-03-20 MED ORDER — ONDANSETRON 4 MG PO TBDP: 4.0000 mg | ORAL_TABLET | Freq: Three times a day (TID) | ORAL | 0 refills | Status: DC | PRN

## 2017-03-20 NOTE — Discharge Instructions (Addendum)
Please follow-up with your primary care provider regarding your ED visit in your symptoms today. I've given you a prescription for Zofran which is a antinausea medication.  Please only take it if you need to.  Please take time to read the enclosed instructions.

## 2017-03-20 NOTE — ED Provider Notes (Signed)
[facility] Provider Note   CSN: [id number] Arrival date & time: 03/20/17  7829     History   Chief Complaint Chief Complaint  Patient presents with  . Abdominal Pain  . Diarrhea    HPI Julia Bautista is a 36 y.o. female who presents today for evaluation of 3 days of belly pain with diarrhea.  She denies any fevers or chills.  She has not taken any medication to try to help her symptoms.  She reports that she has not been nauseous, she will feel her abdominal cramps increase, Diarrhea and then the dissipate. She reports that she has had at least 6 bowel movements today which have been both liquid and loose.  No known sick contacts.    HPI  Past Medical History:  Diagnosis Date  . Abnormal Pap smear   . Asthma   . Herpes   . Other acne    ectopic pregnancy.     Patient Active Problem List   Diagnosis Date Noted  . Hypertension in pregnancy, preeclampsia, delivered 01/07/2017  . Supervision of normal pregnancy 06/11/2016  . history of Herpes 06/11/2016  . History of loop electrical excision procedure (LEEP) 06/11/2016  . S/P repeat cesarean section 06/11/2016  . History of abnormal cervical Pap smear 01/25/2016    Past Surgical History:  Procedure Laterality Date  . CESAREAN SECTION  2005  . CESAREAN SECTION N/A 01/07/2017   Procedure: CESAREAN SECTION;  Surgeon: Willodean Rosenthal, MD;  Location: [facility]IRTH[facility];  Service: Obstetrics;  Laterality: N/A;  . COLPOSCOPY  2013  . FINGER SURGERY    . LEEP  2013  . ROTATOR CUFF REPAIR    . TOOTH EXTRACTION    . UNILATERAL SALPINGECTOMY Right 2008   via mini-lap    OB History    Gravida Para Term Preterm AB Living   7 2 2   5 2    SAB TAB Ectopic Multiple Live Births   2 2 1  0 2      Obstetric Comments   EAB x 3 (all D&Cs), 2015 pLTCS @ 6cm per patient, SAB x 2       Home Medications    Prior to Admission medications   Medication Sig Start Date End Date Taking? Authorizing Provider    albuterol (PROVENTIL HFA;VENTOLIN HFA) 108 (90 Base) MCG/ACT inhaler Inhale 2 puffs into the lungs every 6 (six) hours as needed for wheezing or shortness of breath. Reported on 01/25/2016   Yes [provider]  Prenatal Vit-Fe Fumarate-FA (PRENATAL MULTIVITAMIN) TABS tablet Take 1 tablet by mouth daily at 12 noon.   Yes [provider]  acetaminophen (TYLENOL) 500 MG tablet Take 500 mg by mouth every 6 (six) hours as needed for moderate pain.     [provider]  amLODipine (NORVASC) 10 MG tablet Take 1 tablet (10 mg total) by mouth daily. 01/11/17   Howard Pouch, MD  ondansetron (ZOFRAN ODT) 4 MG disintegrating tablet Take 1 tablet (4 mg total) by mouth every 8 (eight) hours as needed for nausea or vomiting. 03/20/17   Cristina Gong, PA-C    Family History Family History  Problem Relation Age of Onset  . Diabetes Mother   . Hypertension Sister     Social History Social History  Substance Use Topics  . Smoking status: Former Smoker    Packs/day: 0.00    Years: 12.00    Types: E-cigarettes    Quit date: 06/04/2016  . Smokeless tobacco: Never Used  Comment: 1-800- ouit # given  . Alcohol use No     Comment: socially before pregancy     Allergies   Darvon [propoxyphene]; Percocet [oxycodone-acetaminophen]; and Sulfa drugs cross reactors   Review of Systems Review of Systems  Constitutional: Negative for chills and fever.  HENT: Negative for ear pain and sore throat.   Eyes: Negative for pain and visual disturbance.  Respiratory: Negative for cough and shortness of breath.   Cardiovascular: Negative for chest pain and palpitations.  Gastrointestinal: Positive for abdominal pain and diarrhea. Negative for blood in stool, constipation, nausea and vomiting.  Genitourinary: Negative for dysuria and hematuria.  Musculoskeletal: Negative for arthralgias and back pain.  Skin: Negative for color change and rash.  Neurological: Negative for seizures  and syncope.  All other systems reviewed and are negative.    Physical Exam Updated Vital Signs BP (!) 120/98 (BP Location: Left Arm)   Pulse 69   Temp 98 F (36.7 C) (Oral)   Resp 16   SpO2 100%   Physical Exam  Constitutional: She is oriented to person, place, and time. She appears well-developed and well-nourished. No distress.  HENT:  Head: Normocephalic and atraumatic.  Mouth/Throat: No oropharyngeal exudate.  Eyes: Conjunctivae are normal.  Neck: Normal range of motion. Neck supple.  Cardiovascular: Normal rate, regular rhythm and intact distal pulses.  Exam reveals no friction rub.   No murmur heard. Pulmonary/Chest: Effort normal and breath sounds normal. No respiratory distress. She has no wheezes.  Abdominal: Soft. Bowel sounds are normal. She exhibits no distension and no mass. There is generalized tenderness. There is no guarding.  Mild diffuse tenderness, after palpation sudden onset of cramping.   Musculoskeletal: She exhibits no edema.  Neurological: She is alert and oriented to person, place, and time.  Skin: Skin is warm and dry.  Psychiatric: She has a normal mood and affect. Her behavior is normal.  Nursing note and vitals reviewed.    ED Treatments / Results  Labs (all labs ordered are listed, but only abnormal results are displayed) Labs Reviewed  COMPREHENSIVE METABOLIC PANEL - Abnormal; Notable for the following:       Result Value   BUN 5 (*)    All other components within normal limits  CBC - Abnormal; Notable for the following:    Hemoglobin 10.7 (*)    HCT 33.8 (*)    MCV 77.7 (*)    MCH 24.6 (*)    All other components within normal limits  URINALYSIS, ROUTINE W REFLEX MICROSCOPIC - Abnormal; Notable for the following:    APPearance CLOUDY (*)    Hgb urine dipstick SMALL (*)    Leukocytes, UA TRACE (*)    Bacteria, UA MANY (*)    Squamous Epithelial / LPF 6-30 (*)    All other components within normal limits  URINE CULTURE  LIPASE,  BLOOD  I-STAT BETA HCG BLOOD, ED ([facility], [facility], [facility] ONLY)    EKG  EKG Interpretation None       Radiology No results found.  Procedures Procedures (including critical care time)  Medications Ordered in ED Medications  sodium chloride 0.9 % bolus 1,000 mL (0 mLs Intravenous Stopped 03/20/17 1600)  sodium chloride 0.9 % bolus 1,000 mL (0 mLs Intravenous Stopped 03/20/17 1540)     Initial Impression / Assessment and Plan / ED Course  I have reviewed the triage vital signs and the nursing notes.  Pertinent labs & imaging results that were available during my care  of the patient were reviewed by me and considered in my medical decision making (see chart for details).    Patient with symptoms consistent with gastritis.  Likely vira in nature.  Vitals are stable, no fever or tachycardia.  Patient is nontoxic, nonseptic appearing, in no apparent distress.  Patient does not meet the SIRS or Sepsis criteria.  Pt's symptoms have been managed in the department; fluid bolus given.  No signs of dehydration, tolerating PO fluids > 6 oz.  Lungs are clear.  No focal abdominal pain, no peritoneal signs, no concern for appendicitis, cholecystitis, pancreatitis, ruptured viscus, UTI, kidney stone, PID, ectopic pregnancy.  Supportive therapy indicated, bentyl considered but patient is breast feeding and it is contraindicated.  Given rx for zofran.  Patient counseled, expresses understanding and agrees with plan.   Final Clinical Impressions(s) / ED Diagnoses   Final diagnoses:  Diarrhea, unspecified type  Generalized abdominal pain    New Prescriptions Discharge Medication List as of 03/20/2017  3:43 PM    START taking these medications   Details  ondansetron (ZOFRAN ODT) 4 MG disintegrating tablet Take 1 tablet (4 mg total) by mouth every 8 (eight) hours as needed for nausea or vomiting., Starting Wed 03/20/2017, Print         Cristina GongHammond, Elizabeth W, PA-C 03/20/17 1830    Charlynne PanderYao, David Hsienta,  MD 03/21/17 2156

## 2017-03-21 ENCOUNTER — Ambulatory Visit (INDEPENDENT_AMBULATORY_CARE_PROVIDER_SITE_OTHER): Payer: Medicaid Other | Admitting: Student

## 2017-03-21 ENCOUNTER — Encounter: Payer: Self-pay | Admitting: Student

## 2017-03-21 VITALS — BP 132/82 | HR 75 | Wt 186.1 lb

## 2017-03-21 DIAGNOSIS — N912 Amenorrhea, unspecified: Secondary | ICD-10-CM

## 2017-03-21 DIAGNOSIS — B356 Tinea cruris: Secondary | ICD-10-CM | POA: Diagnosis not present

## 2017-03-21 LAB — URINE CULTURE

## 2017-03-21 MED ORDER — NORETHINDRONE 0.35 MG PO TABS: 1.0000 | ORAL_TABLET | Freq: Every day | ORAL | 11 refills | Status: DC

## 2017-03-21 MED ORDER — TERBINAFINE HCL 250 MG PO TABS: 250.0000 mg | ORAL_TABLET | Freq: Every day | ORAL | 0 refills | Status: DC

## 2017-03-21 NOTE — Patient Instructions (Signed)
Norethindrone tablets (contraception) What is this medicine? NORETHINDRONE (nor eth IN drone) is an oral contraceptive. The product contains a female hormone known as a progestin. It is used to prevent pregnancy. This medicine may be used for other purposes; ask your health care provider or pharmacist if you have questions. COMMON BRAND NAME(S): Camila, Deblitane 28-Day, Errin, Heather, Jencycla, Jolivette, Lyza, Nor-QD, Nora-BE, Norlyroc, Ortho Micronor, Sharobel 28-Day What should I tell my health care provider before I take this medicine? They need to know if you have any of these conditions: -blood vessel disease or blood clots -breast, cervical, or vaginal cancer -diabetes -heart disease -kidney disease -liver disease -mental depression -migraine -seizures -stroke -vaginal bleeding -an unusual or allergic reaction to norethindrone, other medicines, foods, dyes, or preservatives -pregnant or trying to get pregnant -breast-feeding How should I use this medicine? Take this medicine by mouth with a glass of water. You may take it with or without food. Follow the directions on the prescription label. Take this medicine at the same time each day and in the order directed on the package. Do not take your medicine more often than directed. Contact your pediatrician regarding the use of this medicine in children. Special care may be needed. This medicine has been used in female children who have started having menstrual periods. A patient package insert for the product will be given with each prescription and refill. Read this sheet carefully each time. The sheet may change frequently. Overdosage: If you think you have taken too much of this medicine contact a poison control center or emergency room at once. NOTE: This medicine is only for you. Do not share this medicine with others. What if I miss a dose? Try not to miss a dose. Every time you miss a dose or take a dose late your chance of  pregnancy increases. When 1 pill is missed (even if only 3 hours late), take the missed pill as soon as possible and continue taking a pill each day at the regular time (use a back up method of birth control for the next 48 hours). If more than 1 dose is missed, use an additional birth control method for the rest of your pill pack until menses occurs. Contact your health care professional if more than 1 dose has been missed. What may interact with this medicine? Do not take this medicine with any of the following medications: -amprenavir or fosamprenavir -bosentan This medicine may also interact with the following medications: -antibiotics or medicines for infections, especially rifampin, rifabutin, rifapentine, and griseofulvin, and possibly penicillins or tetracyclines -aprepitant -barbiturate medicines, such as phenobarbital -carbamazepine -felbamate -modafinil -oxcarbazepine -phenytoin -ritonavir or other medicines for HIV infection or AIDS -St. John's wort -topiramate This list may not describe all possible interactions. Give your health care provider a list of all the medicines, herbs, non-prescription drugs, or dietary supplements you use. Also tell them if you smoke, drink alcohol, or use illegal drugs. Some items may interact with your medicine. What should I watch for while using this medicine? Visit your doctor or health care professional for regular checks on your progress. You will need a regular breast and pelvic exam and Pap smear while on this medicine. Use an additional method of birth control during the first cycle that you take these tablets. If you have any reason to think you are pregnant, stop taking this medicine right away and contact your doctor or health care professional. If you are taking this medicine for hormone related problems, it   may take several cycles of use to see improvement in your condition. This medicine does not protect you against HIV infection (AIDS)  or any other sexually transmitted diseases. What side effects may I notice from receiving this medicine? Side effects that you should report to your doctor or health care professional as soon as possible: -breast tenderness or discharge -pain in the abdomen, chest, groin or leg -severe headache -skin rash, itching, or hives -sudden shortness of breath -unusually weak or tired -vision or speech problems -yellowing of skin or eyes Side effects that usually do not require medical attention (report to your doctor or health care professional if they continue or are bothersome): -changes in sexual desire -change in menstrual flow -facial hair growth -fluid retention and swelling -headache -irritability -nausea -weight gain or loss This list may not describe all possible side effects. Call your doctor for medical advice about side effects. You may report side effects to FDA at 1-800-FDA-1088. Where should I keep my medicine? Keep out of the reach of children. Store at room temperature between 15 and 30 degrees C (59 and 86 degrees F). Throw away any unused medicine after the expiration date. NOTE: This sheet is a summary. It may not cover all possible information. If you have questions about this medicine, talk to your doctor, pharmacist, or health care provider.  2018 Elsevier/Gold Standard (2012-03-28 16:41:35)  
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
## 2017-05-09 ENCOUNTER — Ambulatory Visit (HOSPITAL_COMMUNITY)
Admission: EM | Admit: 2017-05-09 | Discharge: 2017-05-09 | Disposition: A | Discharge status: Home / Self Care | Payer: Medicaid Other | Attending: Emergency Medicine | Admitting: Emergency Medicine

## 2017-05-09 ENCOUNTER — Encounter (HOSPITAL_COMMUNITY): Payer: Self-pay | Admitting: Emergency Medicine

## 2017-05-09 DIAGNOSIS — S39012A Strain of muscle, fascia and tendon of lower back, initial encounter: Secondary | ICD-10-CM | POA: Diagnosis not present
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
## 2017-05-09 NOTE — ED Notes (Signed)
Patient discharged by provider.

## 2017-05-10 ENCOUNTER — Encounter (HOSPITAL_COMMUNITY): Payer: Self-pay | Admitting: Emergency Medicine

## 2017-05-10 ENCOUNTER — Ambulatory Visit (HOSPITAL_COMMUNITY)
Admission: EM | Admit: 2017-05-10 | Discharge: 2017-05-10 | Disposition: A | Discharge status: Home / Self Care | Payer: Self-pay | Attending: Family | Admitting: Family

## 2017-05-10 DIAGNOSIS — M62838 Other muscle spasm: Secondary | ICD-10-CM

## 2017-05-10 MED ORDER — DICLOFENAC SODIUM 1 % TD GEL: 4.0000 g | Freq: Four times a day (QID) | TRANSDERMAL | 1 refills | Status: DC

## 2017-05-10 NOTE — Discharge Instructions (Signed)
Trial of voltaren gel since breastfeeding.   HEAT is key. Gentle stretching. Hot bath.   Let us know if not better!  If there is no improvement in your symptoms, or if there is any worsening of symptoms, or if you have any additional concerns, please return for re-evaluation; or, if we are closed, consider going to the Emergency Room for evaluation if symptoms urgent.
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
## 2017-08-02 ENCOUNTER — Ambulatory Visit: Payer: Medicaid Other | Admitting: Obstetrics and Gynecology

## 2017-08-05 ENCOUNTER — Ambulatory Visit: Payer: Medicaid Other | Admitting: Advanced Practice Midwife

## 2017-08-12 ENCOUNTER — Other Ambulatory Visit (HOSPITAL_COMMUNITY)
Admission: RE | Admit: 2017-08-12 | Discharge: 2017-08-12 | Disposition: A | Discharge status: Home / Self Care | Payer: Medicaid Other | Source: Ambulatory Visit | Attending: Advanced Practice Midwife | Admitting: Advanced Practice Midwife

## 2017-08-12 ENCOUNTER — Encounter: Payer: Self-pay | Admitting: Advanced Practice Midwife

## 2017-08-12 ENCOUNTER — Ambulatory Visit (INDEPENDENT_AMBULATORY_CARE_PROVIDER_SITE_OTHER): Payer: Medicaid Other | Admitting: Advanced Practice Midwife

## 2017-08-12 VITALS — BP 126/84 | HR 68

## 2017-08-12 DIAGNOSIS — Z01419 Encounter for gynecological examination (general) (routine) without abnormal findings: Secondary | ICD-10-CM

## 2017-08-12 DIAGNOSIS — B009 Herpesviral infection, unspecified: Secondary | ICD-10-CM

## 2017-08-12 DIAGNOSIS — B9689 Other specified bacterial agents as the cause of diseases classified elsewhere: Secondary | ICD-10-CM

## 2017-08-12 DIAGNOSIS — Z0000 Encounter for general adult medical examination without abnormal findings: Secondary | ICD-10-CM | POA: Diagnosis not present

## 2017-08-12 DIAGNOSIS — R21 Rash and other nonspecific skin eruption: Secondary | ICD-10-CM

## 2017-08-12 DIAGNOSIS — N760 Acute vaginitis: Principal | ICD-10-CM

## 2017-08-12 MED ORDER — VALACYCLOVIR HCL 1 G PO TABS: 1000.0000 mg | ORAL_TABLET | Freq: Every day | ORAL | 11 refills | Status: DC

## 2017-08-12 MED ORDER — METRONIDAZOLE 0.75 % VA GEL: 1.0000 | Freq: Every day | VAGINAL | 2 refills | Status: DC

## 2017-08-12 NOTE — Patient Instructions (Signed)
Some natural remedies/prevention to try for bacterial vaginosis: °Take a probiotic tablet/capsule every day for at least 1-2 months.   °Whenever you have symptoms, use boric acid suppositories vaginally every night for a week.   °Do not use scented soaps/perfumes in the vaginal area and wear breathable cotton underwear and do not wear tight restrictive clothing. ° ° °

## 2017-08-12 NOTE — Progress Notes (Signed)
Subjective:     Julia Bautista is a 37 y.o. female here for a routine exam.  Current complaints: recurrent BV, especially after intercourse.  She has treated with Flagyl and Metrogel in the past and reports it clears up, but returns. She prefers Metrogel for treatment.  She has hx of genital HSV and is not having any current symptoms but needs refill on her antiviral medication.  There are no other gyn complaints but she reports a patch of skin that itches x 1 year on her right foot. She has tried anti-fungal creams but they have not helped.   She has hx of LEEP but Pap results normal since then but positive for HRHPV, most recently in 2017.    Gynecologic History Patient's last menstrual period was 07/26/2017. Contraception: OCP (estrogen/progesterone) Last Pap: 01/2016. Results were: normal but positive HPV   Obstetric History OB History  Gravida Para Term Preterm AB Living  7 2 2   5 2   SAB TAB Ectopic Multiple Live Births  2 2 1  0 2    # Outcome Date GA Lbr Len/2nd Weight Sex Delivery Anes PTL Lv  7 Term 01/07/17 [redacted]w[redacted]d  6 lb 8.6 oz (2.965 kg) M CS-LTranv Spinal  LIV  6 SAB 03/23/16 [redacted]w[redacted]d         5 Ectopic 2007          4 Term 2005   6 lb 9 oz (2.977 kg) F CS-Unspec EPI  LIV     Birth Comments: born [facility] - c/s FTP, no other complications  3 SAB           2 TAB           1 TAB             Obstetric Comments  EAB x 3 (all D&Cs), 2015 pLTCS @ 6cm per patient, SAB x 2     The following portions of the patient's history were reviewed and updated as appropriate: allergies, current medications, past family history, past medical history, past social history, past surgical history and problem list.  Review of Systems Pertinent items noted in HPI and remainder of comprehensive ROS otherwise negative.    Objective:    BP 126/84   Pulse 68   LMP 07/26/2017    VS reviewed, nursing note reviewed,  Constitutional: well developed, well nourished, no distress HEENT: normocephalic CV:  normal rate Pulm/chest wall: normal effort Breast Exam:  right breast normal without mass, skin or nipple changes or axillary nodes, left breast normal without mass, skin or nipple changes or axillary nodes Abdomen: soft Neuro: alert and oriented x 3 Skin: warm, dry Psych: affect normal Pelvic exam: Cervix pink, visually closed, without lesion, scant white creamy discharge, vaginal walls and external genitalia normal Bimanual exam: Cervix 0/long/high, firm, anterior, neg CMT, uterus nontender, nonenlarged, adnexa without tenderness, enlargement, or mass  Assessment/Plan:   1. BV (bacterial vaginosis) --Discussed prevention and natural remedies including probiotics, use of boric acid suppositories, cotton underwear, and decreased use of soaps/perfumes - metroNIDAZOLE (METROGEL) 0.75 % vaginal gel; Place 1 Applicatorful vaginally at bedtime. Apply one applicatorful to vagina at bedtime for 5 days  Dispense: 70 g; Refill: 2 - Cytology - PAP  2. history of Herpes  - valACYclovir (VALTREX) 1000 MG tablet; Take 1 tablet (1,000 mg total) by mouth daily.  Dispense: 5 tablet; Refill: 11  3. Well woman exam with routine gynecological exam  - Cytology - PAP --HPV testing, and testing for  STDs, yeast, and BV done today --F/U dependent upon today's Pap results  4. Rash and nonspecific skin eruption --Rash on right foot c/w eczema vs psoriasis, no evidence of fungal infection.   --Pt to try OTC hydrocortisone, f/u with primary care if not improved  Sharen CounterLisa Leftwich-Kirby, CNM 8:59 PM

## 2017-08-15 LAB — CYTOLOGY - PAP
BACTERIAL VAGINITIS: POSITIVE — AB
CANDIDA VAGINITIS: NEGATIVE
CHLAMYDIA, DNA PROBE: NEGATIVE
DIAGNOSIS: NEGATIVE
HPV: NOT DETECTED
NEISSERIA GONORRHEA: NEGATIVE

## 2017-09-06 IMAGING — US US OB TRANSVAGINAL
1 series · 15 of 28 positions shown · non-contrast
Comparison: 03/23/2016

CLINICAL DATA: Unsure of LMP. First trimester pregnancy with
inconclusive fetal viability.

EXAM:
TRANSVAGINAL OB ULTRASOUND
TECHNIQUE: Transvaginal ultrasound was performed for complete evaluation of the
gestation as well as the maternal uterus, adnexal regions, and
pelvic cul-de-sac.

[Series 1: us ob transvaginal · 15 of 74 slices shown]
[im 1/74]
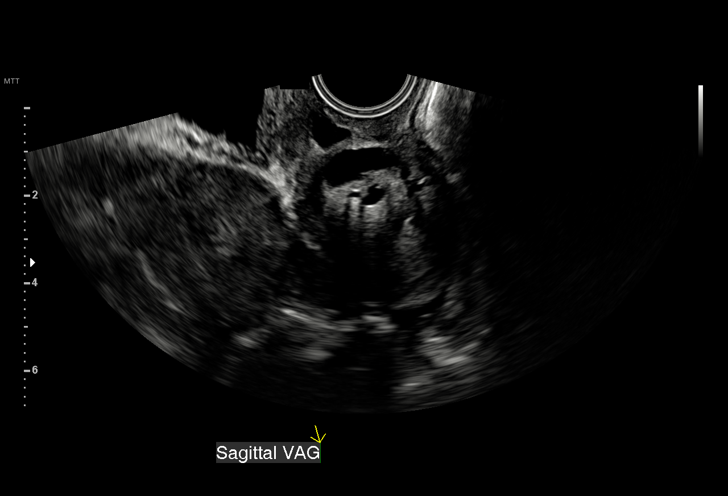
[im 6/74]
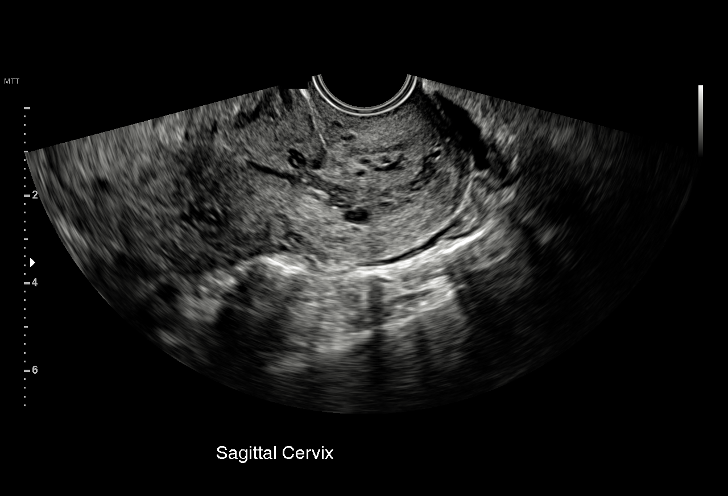
[im 11/74]
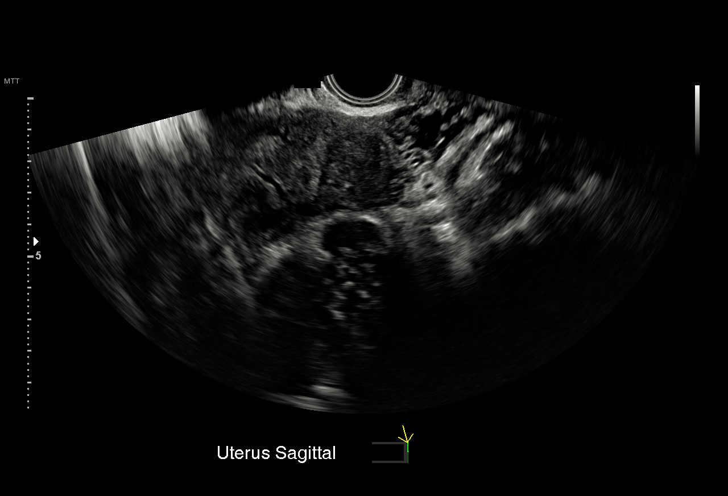
[im 17/74]
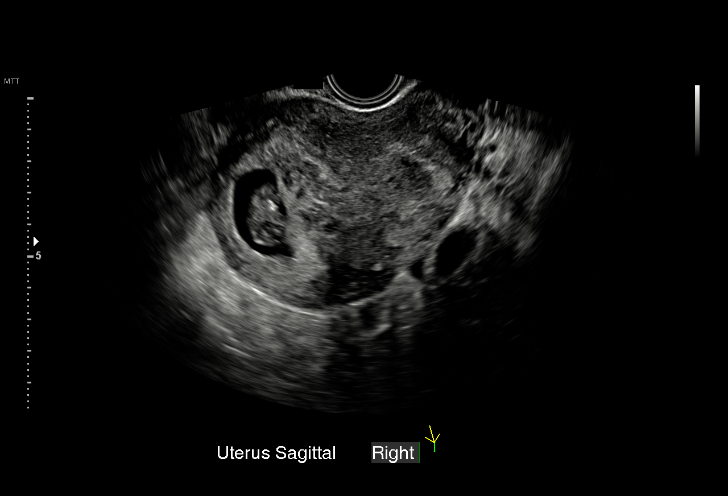
[im 22/74]
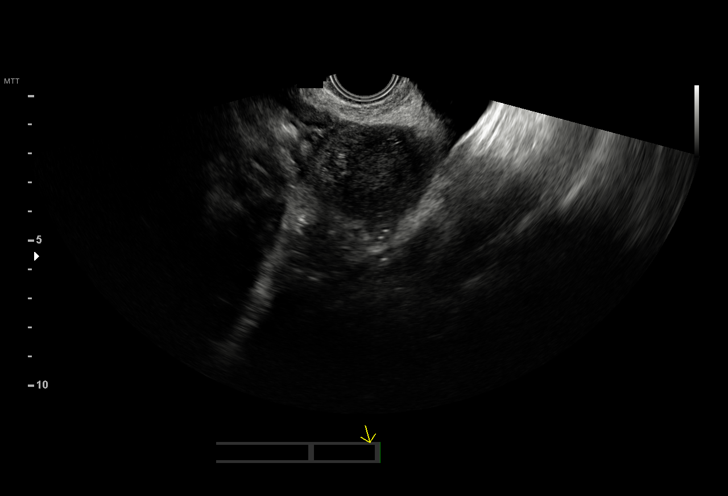
[im 28/74]
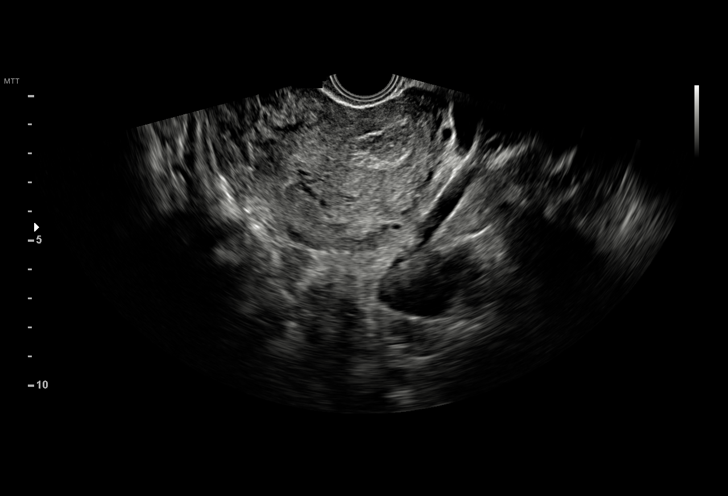
[im 33/74]
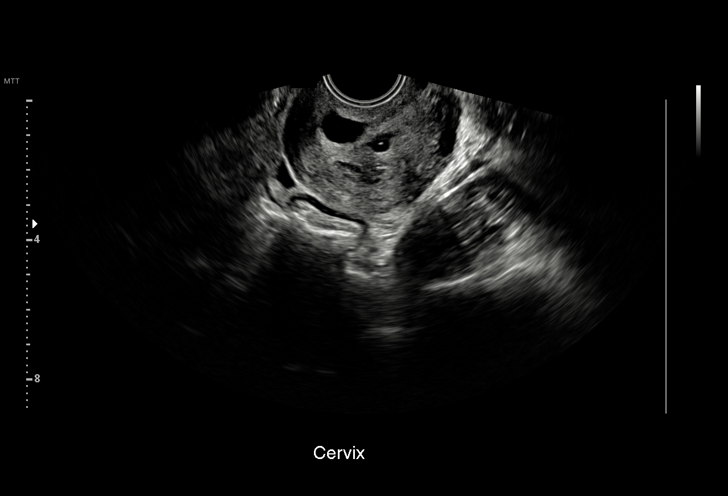
[im 38/74]
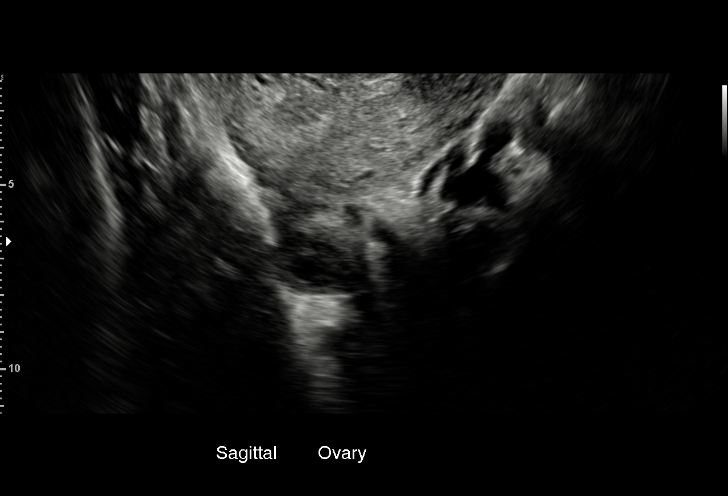
[im 41/74]
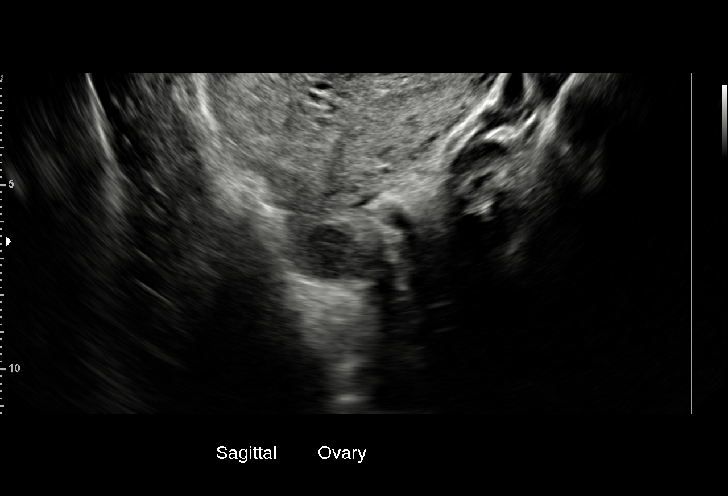
[im 46/74]
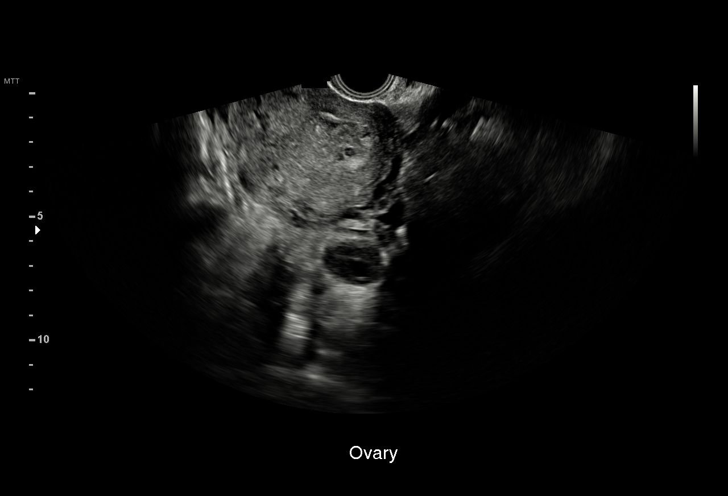
[im 52/74]
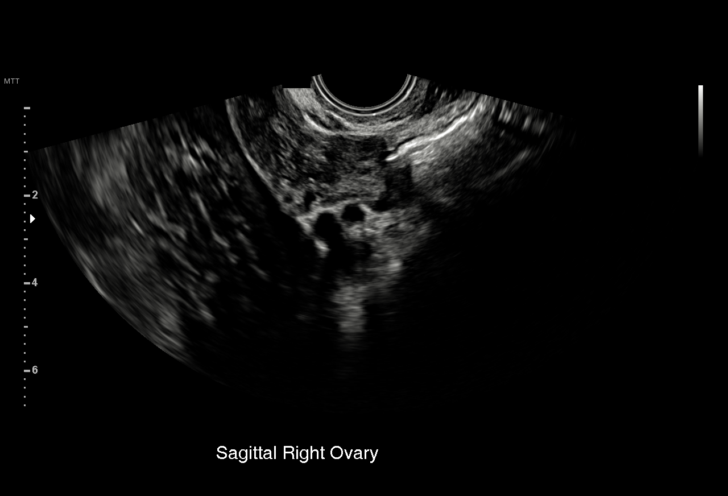
[im 57/74]
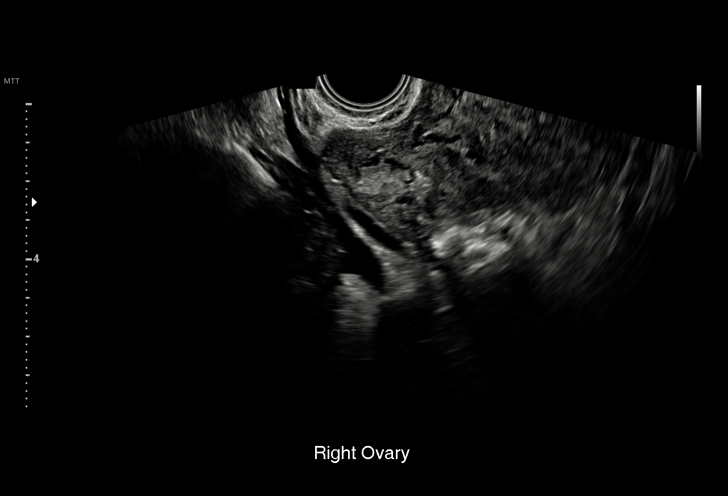
[im 63/74]
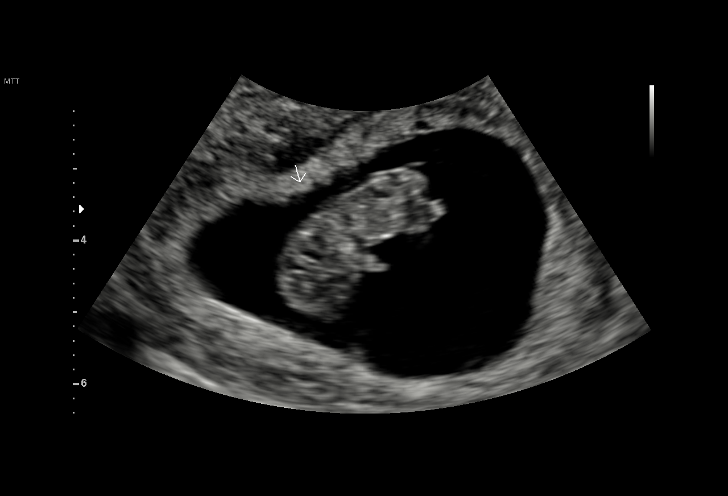
[im 68/74]
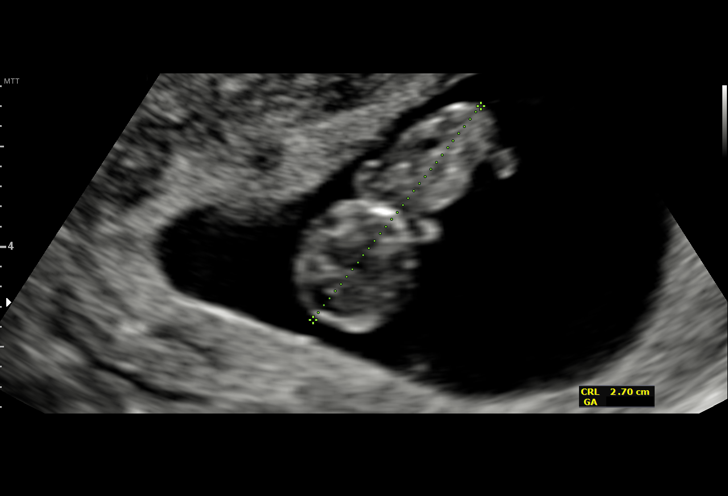
[im 74/74]
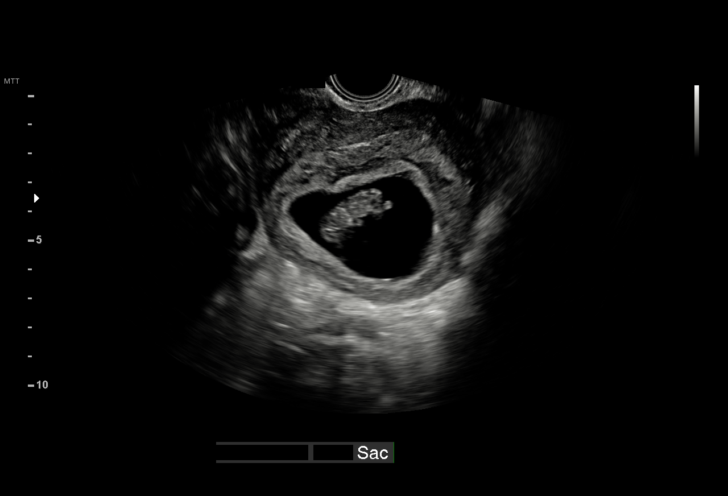

[15 of 28 positions shown; findings below may reference images not displayed]

FINDINGS: Intrauterine gestational sac: Single

Yolk sac:  Visualized.

Embryo:  Visualized.

Cardiac Activity: Visualized.

Heart Rate: 167 bpm

CRL:   27  mm   9 w 4 d                  US EDC: 01/21/2017

Subchorionic hemorrhage:  None visualized.

Maternal uterus/adnexae: Both ovaries are normal in appearance. No
adnexal mass or abnormal free fluid visualized.
IMPRESSION: Single living IUP measuring 9 weeks 4 days, with US EDC of
01/21/2017.

No significant maternal uterine or adnexal abnormality identified.

## 2017-10-13 ENCOUNTER — Inpatient Hospital Stay (HOSPITAL_COMMUNITY)
Admission: AD | Admit: 2017-10-13 | Discharge: 2017-10-13 | Disposition: A | Discharge status: Home / Self Care | Payer: Medicaid Other | Source: Ambulatory Visit | Attending: Obstetrics and Gynecology | Admitting: Obstetrics and Gynecology

## 2017-10-13 ENCOUNTER — Other Ambulatory Visit: Payer: Self-pay

## 2017-10-13 ENCOUNTER — Encounter (HOSPITAL_COMMUNITY): Payer: Self-pay

## 2017-10-13 ENCOUNTER — Inpatient Hospital Stay (HOSPITAL_COMMUNITY): Payer: Medicaid Other

## 2017-10-13 DIAGNOSIS — Z882 Allergy status to sulfonamides status: Secondary | ICD-10-CM | POA: Diagnosis not present

## 2017-10-13 DIAGNOSIS — R1110 Vomiting, unspecified: Secondary | ICD-10-CM | POA: Diagnosis not present

## 2017-10-13 DIAGNOSIS — N83201 Unspecified ovarian cyst, right side: Secondary | ICD-10-CM

## 2017-10-13 DIAGNOSIS — Z87891 Personal history of nicotine dependence: Secondary | ICD-10-CM | POA: Insufficient documentation

## 2017-10-13 DIAGNOSIS — Z833 Family history of diabetes mellitus: Secondary | ICD-10-CM | POA: Insufficient documentation

## 2017-10-13 DIAGNOSIS — B9689 Other specified bacterial agents as the cause of diseases classified elsewhere: Secondary | ICD-10-CM | POA: Diagnosis not present

## 2017-10-13 DIAGNOSIS — N939 Abnormal uterine and vaginal bleeding, unspecified: Secondary | ICD-10-CM | POA: Insufficient documentation

## 2017-10-13 DIAGNOSIS — Z8249 Family history of ischemic heart disease and other diseases of the circulatory system: Secondary | ICD-10-CM | POA: Insufficient documentation

## 2017-10-13 DIAGNOSIS — N760 Acute vaginitis: Secondary | ICD-10-CM | POA: Diagnosis not present

## 2017-10-13 DIAGNOSIS — R1031 Right lower quadrant pain: Secondary | ICD-10-CM | POA: Diagnosis not present

## 2017-10-13 DIAGNOSIS — N83209 Unspecified ovarian cyst, unspecified side: Secondary | ICD-10-CM

## 2017-10-13 DIAGNOSIS — Z9889 Other specified postprocedural states: Secondary | ICD-10-CM | POA: Diagnosis not present

## 2017-10-13 DIAGNOSIS — Z885 Allergy status to narcotic agent status: Secondary | ICD-10-CM | POA: Insufficient documentation

## 2017-10-13 DIAGNOSIS — Z79899 Other long term (current) drug therapy: Secondary | ICD-10-CM | POA: Insufficient documentation

## 2017-10-13 DIAGNOSIS — Z888 Allergy status to other drugs, medicaments and biological substances status: Secondary | ICD-10-CM | POA: Insufficient documentation

## 2017-10-13 DIAGNOSIS — R109 Unspecified abdominal pain: Secondary | ICD-10-CM | POA: Insufficient documentation

## 2017-10-13 DIAGNOSIS — J45909 Unspecified asthma, uncomplicated: Secondary | ICD-10-CM | POA: Insufficient documentation

## 2017-10-13 DIAGNOSIS — Z3202 Encounter for pregnancy test, result negative: Secondary | ICD-10-CM | POA: Diagnosis present

## 2017-10-13 LAB — CBC WITH DIFFERENTIAL/PLATELET
Basophils Absolute: 0 10*3/uL (ref 0.0–0.1)
Basophils Relative: 0 %
EOS PCT: 0 %
Eosinophils Absolute: 0 10*3/uL (ref 0.0–0.7)
HEMATOCRIT: 33.7 % — AB (ref 36.0–46.0)
HEMOGLOBIN: 10.7 g/dL — AB (ref 12.0–15.0)
LYMPHS ABS: 0.8 10*3/uL (ref 0.7–4.0)
LYMPHS PCT: 17 %
MCH: 24.7 pg — ABNORMAL LOW (ref 26.0–34.0)
MCHC: 31.8 g/dL (ref 30.0–36.0)
MCV: 77.6 fL — AB (ref 78.0–100.0)
Monocytes Absolute: 0.1 10*3/uL (ref 0.1–1.0)
Monocytes Relative: 2 %
NEUTROS ABS: 3.9 10*3/uL (ref 1.7–7.7)
Neutrophils Relative %: 81 %
PLATELETS: 269 10*3/uL (ref 150–400)
RBC: 4.34 MIL/uL (ref 3.87–5.11)
RDW: 13.1 % (ref 11.5–15.5)
WBC: 4.9 10*3/uL (ref 4.0–10.5)

## 2017-10-13 LAB — WET PREP, GENITAL
Clue Cells Wet Prep HPF POC: NONE SEEN
SPERM: NONE SEEN
TRICH WET PREP: NONE SEEN
Yeast Wet Prep HPF POC: NONE SEEN

## 2017-10-13 LAB — URINALYSIS, ROUTINE W REFLEX MICROSCOPIC
BILIRUBIN URINE: NEGATIVE
Glucose, UA: NEGATIVE mg/dL
Ketones, ur: NEGATIVE mg/dL
Leukocytes, UA: NEGATIVE
Nitrite: NEGATIVE
PH: 7 (ref 5.0–8.0)
Protein, ur: NEGATIVE mg/dL
SPECIFIC GRAVITY, URINE: 1.015 (ref 1.005–1.030)

## 2017-10-13 LAB — URINALYSIS, MICROSCOPIC (REFLEX)
Bacteria, UA: NONE SEEN
RBC / HPF: NONE SEEN RBC/hpf (ref 0–5)
WBC, UA: NONE SEEN WBC/hpf (ref 0–5)

## 2017-10-13 LAB — POCT PREGNANCY, URINE: Preg Test, Ur: NEGATIVE

## 2017-10-13 MED ORDER — METRONIDAZOLE 0.75 % VA GEL: 1.0000 | Freq: Every day | VAGINAL | 2 refills | Status: DC

## 2017-10-13 NOTE — MAU Note (Signed)
Started period on Thursday- regular with heavy bleeding. Friday night started having pain on right abdomen and back. Took tylenol and that helped. Bleeding stopped yesterday and less pain. This AM woke up with spotting and pain was worse.

## 2017-10-13 NOTE — Discharge Instructions (Signed)
Ovarian Cyst An ovarian cyst is a fluid-filled sac on an ovary. The ovaries are organs that make eggs in women. Most ovarian cysts go away on their own and are not cancerous (are benign). Some cysts need treatment. Follow these instructions at home:  Take over-the-counter and prescription medicines only as told by your doctor.  Do not drive or use heavy machinery while taking prescription pain medicine.  Get pelvic exams and Pap tests as often as told by your doctor.  Return to your normal activities as told by your doctor. Ask your doctor what activities are safe for you.  Do not use any products that contain nicotine or tobacco, such as cigarettes and e-cigarettes. If you need help quitting, ask your doctor.  Keep all follow-up visits as told by your doctor. This is important. Contact a doctor if:  Your periods are: ? Late. ? Irregular. ? Painful.  Your periods stop.  You have pelvic pain that does not go away.  You have pressure on your bladder.  You have trouble making your bladder empty when you pee (urinate).  You have pain during sex.  You have any of the following in your belly (abdomen): ? A feeling of fullness. ? Pressure. ? Discomfort. ? Pain that does not go away. ? Swelling.  You feel sick most of the time.  You have trouble pooping (have constipation).  You are not as hungry as usual (you lose your appetite).  You get very bad acne.  You start to have more hair on your body and face.  You are gaining weight or losing weight without changing your exercise and eating habits.  You think you may be pregnant. Get help right away if:  You have belly pain that is very bad or gets worse.  You cannot eat or drink without throwing up (vomiting).  You suddenly get a fever.  Your period is a lot heavier than usual. This information is not intended to replace advice given to you by your health care provider. Make sure you discuss any questions you have  with your health care provider. Document Released: 12/26/2007 Document Revised: 01/27/2016 Document Reviewed: 12/11/2015 Elsevier Interactive Patient Education  2018 [employer]  

## 2017-10-13 NOTE — MAU Provider Note (Signed)
History     CSN: [id number]  Arrival date and time: 10/13/17 1129   First Provider Initiated Contact with Patient 10/13/17 1237      Chief Complaint  Patient presents with  . Abdominal Pain  . Vaginal Bleeding   HPI Julia Bautista is 37 y.o. [phone] presents with right sided abdominal pain. Began period 4 days ago at expected time. The following day, she had "terrible" pain on the right, vomiting with the pain.  Rates pain at its worse at 10/10.  Currently 5-6/10.  No further vomiting. Took Tylenol, which relieved the pain so she could sleep.  The following morning the pain continued and bleeding had stopped. Saw light bleeding this am.  This is unusual for her because periods usually last 6-7 days since she delivered in June 2018.Marland Kitchen Took Tylenol at 7am today which helped.  She is sexually active, 1 partner X 6 years. Not using contraception because a pregnancy would be ok.  She is a patient in the clinic downstairs.   Past Medical History:  Diagnosis Date  . Abnormal Pap smear   . Asthma    PRN inhaler  . Herpes   . Other acne    ectopic pregnancy.     Past Surgical History:  Procedure Laterality Date  . CESAREAN SECTION  2005  . CESAREAN SECTION N/A 01/07/2017   Procedure: CESAREAN SECTION;  Surgeon: Willodean Rosenthal, MD;  Location: [facility] BIRTHING SUIT[facility];  Service: Obstetrics;  Laterality: N/A;  . COLPOSCOPY  2013  . FINGER SURGERY    . LEEP  2013  . ROTATOR CUFF REPAIR    . TOOTH EXTRACTION    . UNILATERAL SALPINGECTOMY Right 2008   via mini-lap    Family History  Problem Relation Age of Onset  . Diabetes Mother   . Hypertension Sister     Social History   Tobacco Use  . Smoking status: Former Smoker    Packs/day: 0.00    Years: 12.00    Pack years: 0.00    Types: E-cigarettes    Last attempt to quit: 06/04/2016    Years since quitting: 1.3  . Smokeless tobacco: Never Used  . Tobacco comment: 1-800- ouit # given  Substance Use Topics  . Alcohol use:  No    Alcohol/week: 0.0 oz    Comment: socially before pregancy  . Drug use: No    Allergies:  Allergies  Allergen Reactions  . Darvon [Propoxyphene] Itching  . Percocet [Oxycodone-Acetaminophen] Hives  . Sulfa Drugs Cross Reactors Hives    Medications Prior to Admission  Medication Sig Dispense Refill Last Dose  . acetaminophen (TYLENOL) 500 MG tablet Take 1,000 mg by mouth every 6 (six) hours as needed for mild pain.    10/13/2017 at Unknown time  . amoxicillin (AMOXIL) 875 MG tablet Take 875 mg by mouth 2 (two) times daily.   0 10/12/2017 at Unknown time  . OVER THE COUNTER MEDICATION Take 1 capsule by mouth daily. Patient takes the herbal supplement Sea Moss.   10/12/2017 at Unknown time  . Probiotic Product (PROBIOTIC DAILY) CAPS Take 1 capsule by mouth daily.   Past Week at Unknown time  . valACYclovir (VALTREX) 1000 MG tablet Take 1 tablet (1,000 mg total) by mouth daily. (Patient taking differently: Take 1,000 mg by mouth daily as needed (For outbreak.). ) 5 tablet 11 Past Month at Unknown time  . [DISCONTINUED] metroNIDAZOLE (METROGEL) 0.75 % vaginal gel Place 1 Applicatorful vaginally at bedtime. Apply one applicatorful to  vagina at bedtime for 5 days 70 g 2 Past Week at Unknown time  . albuterol (PROVENTIL HFA;VENTOLIN HFA) 108 (90 Base) MCG/ACT inhaler Inhale 2 puffs into the lungs every 6 (six) hours as needed for wheezing or shortness of breath. Reported on 01/25/2016   Rescue  . diclofenac sodium (VOLTAREN) 1 % GEL Apply 4 g topically 4 (four) times daily. (Patient not taking: Reported on 10/13/2017) 1 Tube 1 Not Taking at Unknown time    Review of Systems  Constitutional: Negative for activity change, appetite change, chills and fatigue.  Respiratory: Negative for chest tightness.   Cardiovascular: Negative for chest pain.  Gastrointestinal: Positive for abdominal pain (right sided pain) and vomiting. Negative for constipation and diarrhea. Rectal pain: 2 days ago.   Genitourinary: Positive for vaginal bleeding. Negative for difficulty urinating, dysuria, frequency and vaginal discharge. Urgency: on period.   Physical Exam   Blood pressure 132/87, pulse 77, temperature 98.1 F (36.7 C), temperature source Oral, height 5\' 3"  (1.6 m), weight 180 lb (81.6 kg), last menstrual period 10/10/2017, currently breastfeeding.  Physical Exam  Nursing note and vitals reviewed. Constitutional: She is oriented to person, place, and time. She appears well-developed and well-nourished. No distress.  HENT:  Head: Normocephalic.  Neck: Normal range of motion.  Cardiovascular: Normal rate.  Respiratory: Effort normal.  GI: Soft. She exhibits no distension and no mass. There is tenderness (mild right sided tenderness on exam.  Neg for guarding or rebound). There is no rebound and no guarding.  Genitourinary: There is no rash, tenderness or lesion on the right labia. There is no rash, tenderness or lesion on the left labia. Uterus is not enlarged and not tender. Cervix exhibits no motion tenderness, no discharge and no friability. Right adnexum displays tenderness (mild on exam). Right adnexum displays no mass and no fullness. Left adnexum displays no mass, no tenderness and no fullness. There is bleeding (small amount of bright red blood without clots Neg for odor) in the vagina. No erythema or tenderness in the vagina. No vaginal discharge found.  Neurological: She is alert and oriented to person, place, and time.  Skin: Skin is warm and dry.  Psychiatric: She has a normal mood and affect. Her behavior is normal. Thought content normal.   Results for orders placed or performed during the hospital encounter of 10/13/17 (from the past 24 hour(s))  Urinalysis, Routine w reflex microscopic     Status: Abnormal   Collection Time: 10/13/17 11:45 AM  Result Value Ref Range   Color, Urine YELLOW YELLOW   APPearance CLEAR CLEAR   Specific Gravity, Urine 1.015 1.005 - 1.030   pH  7.0 5.0 - 8.0   Glucose, UA NEGATIVE NEGATIVE mg/dL   Hgb urine dipstick MODERATE (A) NEGATIVE   Bilirubin Urine NEGATIVE NEGATIVE   Ketones, ur NEGATIVE NEGATIVE mg/dL   Protein, ur NEGATIVE NEGATIVE mg/dL   Nitrite NEGATIVE NEGATIVE   Leukocytes, UA NEGATIVE NEGATIVE  Urinalysis, Microscopic (reflex)     Status: Abnormal   Collection Time: 10/13/17 11:45 AM  Result Value Ref Range   RBC / HPF NONE SEEN 0 - 5 RBC/hpf   WBC, UA NONE SEEN 0 - 5 WBC/hpf   Bacteria, UA NONE SEEN NONE SEEN   Squamous Epithelial / LPF 0-5 (A) NONE SEEN  Pregnancy, urine POC     Status: None   Collection Time: 10/13/17 11:56 AM  Result Value Ref Range   Preg Test, Ur NEGATIVE NEGATIVE  CBC with Differential/Platelet  Status: Abnormal   Collection Time: 10/13/17 12:20 PM  Result Value Ref Range   WBC 4.9 4.0 - 10.5 K/uL   RBC 4.34 3.87 - 5.11 MIL/uL   Hemoglobin 10.7 (L) 12.0 - 15.0 g/dL   HCT 65.733.7 (L) 84.636.0 - 96.246.0 %   MCV 77.6 (L) 78.0 - 100.0 fL   MCH 24.7 (L) 26.0 - 34.0 pg   MCHC 31.8 30.0 - 36.0 g/dL   RDW 95.213.1 84.111.5 - 32.415.5 %   Platelets 269 150 - 400 K/uL   Neutrophils Relative % 81 %   Neutro Abs 3.9 1.7 - 7.7 K/uL   Lymphocytes Relative 17 %   Lymphs Abs 0.8 0.7 - 4.0 K/uL   Monocytes Relative 2 %   Monocytes Absolute 0.1 0.1 - 1.0 K/uL   Eosinophils Relative 0 %   Eosinophils Absolute 0.0 0.0 - 0.7 K/uL   Basophils Relative 0 %   Basophils Absolute 0.0 0.0 - 0.1 K/uL  Wet prep, genital     Status: Abnormal   Collection Time: 10/13/17  1:00 PM  Result Value Ref Range   Yeast Wet Prep HPF POC NONE SEEN NONE SEEN   Trich, Wet Prep NONE SEEN NONE SEEN   Clue Cells Wet Prep HPF POC NONE SEEN NONE SEEN   WBC, Wet Prep HPF POC FEW (A) NONE SEEN   Sperm NONE SEEN    [place] Pelvic Complete With Transvaginal  Result Date: 10/13/2017 CLINICAL DATA:  Pain in the right lower quadrant. EXAM: TRANSABDOMINAL AND TRANSVAGINAL ULTRASOUND OF PELVIS TECHNIQUE: Both transabdominal and transvaginal  ultrasound examinations of the pelvis were performed. Transabdominal technique was performed for global imaging of the pelvis including uterus, ovaries, adnexal regions, and pelvic cul-de-sac. It was necessary to proceed with endovaginal exam following the transabdominal exam to visualize the endometrium and ovaries. COMPARISON:  None FINDINGS: Uterus Measurements: 8 x 4.1 x 4.2 cm. No fibroids or other mass visualized. Endometrium Thickness: 7.4 mm.  No focal abnormality visualized. Right ovary Measurements: 3.9 x 2.9 x 3.5 cm. Contains a 2.5 x 2.4 x 2.8 cm complex cystic mass, likely a hemorrhagic follicle. Left ovary Measurements: 3.2 x 1.3 x 1.8 cm. Normal appearance/no adnexal mass. Other findings No abnormal free fluid. IMPRESSION: 1. Probable hemorrhagic follicle/cyst in the right ovary. Recommend follow-up ultrasound in 6-12 weeks to ensure resolution. No other abnormalities. Electronically Signed   By: Gerome Samavid  Williams III M.D   On: 10/13/2017 14:01   MAU Course  Procedures GC/CHL results will be reported  MDM MSE Exam Labs U/s   Assessment and Plan  A:  Right sided abdominal pain      Neg Pregnancy test      Probable hemorrhagic right ovarian cyst per U/S report       P: Instructed patient to make apptin the clinic  to have f/u exam and ultrasound in 6-12 weeks.      Advil prn for pain.               Dennison Mascotve M Key 10/13/2017, 3:17 PM

## 2017-10-13 NOTE — Progress Notes (Signed)
G7P2 nonpregnant here for back pain and bleeding since Friday. Bleeding stopped on Saturday. Pain progressively got worse today with spot bleeding. Took tylenol 1gm at 0600 and helped some. Was unsure if pregnant and not using protection. Just had baby 9 months ago. Not using protection as not against getting pregnant again.

## 2017-10-14 LAB — GC/CHLAMYDIA PROBE AMP (~~LOC~~) NOT AT ARMC
Chlamydia: NEGATIVE
Neisseria Gonorrhea: NEGATIVE

## 2017-11-26 ENCOUNTER — Encounter: Payer: Self-pay | Admitting: *Deleted

## 2018-06-30 NOTE — Telephone Encounter (Signed)
Entered in error

## 2019-02-03 ENCOUNTER — Telehealth: Payer: Managed Care, Other (non HMO) | Admitting: Obstetrics and Gynecology

## 2019-05-18 ENCOUNTER — Encounter (HOSPITAL_COMMUNITY): Payer: Self-pay | Admitting: Family Medicine

## 2019-05-18 ENCOUNTER — Ambulatory Visit (HOSPITAL_COMMUNITY)
Admission: EM | Admit: 2019-05-18 | Discharge: 2019-05-18 | Disposition: A | Discharge status: Home / Self Care | Payer: Managed Care, Other (non HMO) | Attending: Family Medicine | Admitting: Family Medicine

## 2019-05-18 ENCOUNTER — Other Ambulatory Visit: Payer: Self-pay

## 2019-05-18 DIAGNOSIS — K529 Noninfective gastroenteritis and colitis, unspecified: Secondary | ICD-10-CM

## 2019-05-18 LAB — CBC WITH DIFFERENTIAL/PLATELET
Abs Immature Granulocytes: 0.01 10*3/uL (ref 0.00–0.07)
Basophils Absolute: 0 10*3/uL (ref 0.0–0.1)
Basophils Relative: 1 %
Eosinophils Absolute: 0.1 10*3/uL (ref 0.0–0.5)
Eosinophils Relative: 2 %
HCT: 34.4 % — ABNORMAL LOW (ref 36.0–46.0)
Hemoglobin: 10.9 g/dL — ABNORMAL LOW (ref 12.0–15.0)
Immature Granulocytes: 0 %
Lymphocytes Relative: 23 %
Lymphs Abs: 0.7 10*3/uL (ref 0.7–4.0)
MCH: 24.9 pg — ABNORMAL LOW (ref 26.0–34.0)
MCHC: 31.7 g/dL (ref 30.0–36.0)
MCV: 78.7 fL — ABNORMAL LOW (ref 80.0–100.0)
Monocytes Absolute: 0.3 10*3/uL (ref 0.1–1.0)
Monocytes Relative: 9 %
Neutro Abs: 2 10*3/uL (ref 1.7–7.7)
Neutrophils Relative %: 65 %
Platelets: 235 10*3/uL (ref 150–400)
RBC: 4.37 MIL/uL (ref 3.87–5.11)
RDW: 14 % (ref 11.5–15.5)
WBC: 3.1 10*3/uL — ABNORMAL LOW (ref 4.0–10.5)
nRBC: 0 % (ref 0.0–0.2)

## 2019-05-18 LAB — COMPREHENSIVE METABOLIC PANEL
ALT: 9 U/L (ref 0–44)
AST: 19 U/L (ref 15–41)
Albumin: 3.9 g/dL (ref 3.5–5.0)
Alkaline Phosphatase: 33 U/L — ABNORMAL LOW (ref 38–126)
Anion gap: 7 (ref 5–15)
BUN: 7 mg/dL (ref 6–20)
CO2: 23 mmol/L (ref 22–32)
Calcium: 9 mg/dL (ref 8.9–10.3)
Chloride: 106 mmol/L (ref 98–111)
Creatinine, Ser: 1.08 mg/dL — ABNORMAL HIGH (ref 0.44–1.00)
GFR calc Af Amer: >60 mL/min (ref >60)
GFR calc non Af Amer: >60 mL/min (ref >60)
Glucose, Bld: 89 mg/dL (ref 70–99)
Potassium: 4.2 mmol/L (ref 3.5–5.1)
Sodium: 136 mmol/L (ref 135–145)
Total Bilirubin: 0.3 mg/dL (ref 0.3–1.2)
Total Protein: 7.1 g/dL (ref 6.5–8.1)

## 2019-05-18 MED ORDER — METRONIDAZOLE 500 MG PO TABS: 500.0000 mg | ORAL_TABLET | Freq: Two times a day (BID) | ORAL | 0 refills | Status: DC

## 2019-05-18 MED ORDER — HYOSCYAMINE SULFATE SL 0.125 MG SL SUBL: 1.0000 | SUBLINGUAL_TABLET | Freq: Three times a day (TID) | SUBLINGUAL | 0 refills | Status: DC | PRN

## 2019-05-18 NOTE — Discharge Instructions (Signed)
Avoid dairy products for 5 days

## 2019-05-18 NOTE — ED Provider Notes (Addendum)
[place]    CSN: [id number] Arrival date & time: 05/18/19  3810      History   Chief Complaint No chief complaint on file.   HPI Julia Bautista is a 38 y.o. female.   [facility]ablished [facility] patient complaining of abdominal pain.  The crampy nature is epigastric and periumbilical.  It has interfered with sleep.  Loose stools with lots of gas.  No belching.  No fever.  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  Symptoms began 4 days ago.  No loss of appetite.  LMP last week.  Denies intercourse since.     Past Medical History:  Diagnosis Date  . Abnormal Pap smear   . Asthma    PRN inhaler  . Herpes   . Other acne    ectopic pregnancy.     Patient Active Problem List   Diagnosis Date Noted  . history of Herpes 06/11/2016  . History of loop electrical excision procedure (LEEP) 06/11/2016  . S/P repeat cesarean section 06/11/2016  . History of abnormal cervical Pap smear 01/25/2016    Past Surgical History:  Procedure Laterality Date  . CESAREAN SECTION  2005  . CESAREAN SECTION N/A 01/07/2017   Procedure: CESAREAN SECTION;  Surgeon: Lavonia Drafts, MD;  Location: [place];  Service: Obstetrics;  Laterality: N/A;  . COLPOSCOPY  2013  . FINGER SURGERY    . LEEP  2013  . ROTATOR CUFF REPAIR    . TOOTH EXTRACTION    . UNILATERAL SALPINGECTOMY Right 2008   via mini-lap    OB History    Gravida  7   Para  2   Term  2   Preterm      AB  5   Living  2     SAB  2   TAB  2   Ectopic  1   Multiple  0   Live Births  2        Obstetric Comments  EAB x 3 (all D&Cs), 2015 pLTCS @ 6cm per patient, SAB x 2         Home Medications    Prior to Admission medications   Medication Sig Start Date End Date Taking? Authorizing Provider  acetaminophen (TYLENOL) 500 MG tablet Take 1,000 mg by mouth every 6 (six) hours as needed for mild pain.     [provider]  albuterol (PROVENTIL HFA;VENTOLIN  HFA) 108 (90 Base) MCG/ACT inhaler Inhale 2 puffs into the lungs every 6 (six) hours as needed for wheezing or shortness of breath. Reported on 01/25/2016    [provider]  Hyoscyamine Sulfate SL (LEVSIN/SL) 0.125 MG SUBL Place 1 tablet under the tongue every 8 (eight) hours as needed. 05/18/19   Robyn Haber, MD  metroNIDAZOLE (FLAGYL) 500 MG tablet Take 1 tablet (500 mg total) by mouth 2 (two) times daily. 05/18/19   Robyn Haber, MD  OVER THE COUNTER MEDICATION Take 1 capsule by mouth daily. Patient takes the herbal supplement Sea Moss.    [provider]  Probiotic Product (PROBIOTIC DAILY) CAPS Take 1 capsule by mouth daily.    [provider]  valACYclovir (VALTREX) 1000 MG tablet Take 1 tablet (1,000 mg total) by mouth daily. Patient taking differently: Take 1,000 mg by mouth daily as needed (For outbreak.).  08/12/17   Leftwich-Kirby, Kathie Dike, CNM    Family History Family History  Problem Relation Age of Onset  . Diabetes Mother   .  Hypertension Sister     Social History Social History   Tobacco Use  . Smoking status: Former Smoker    Packs/day: 0.00    Years: 12.00    Pack years: 0.00    Types: E-cigarettes    Quit date: 06/04/2016    Years since quitting: 2.9  . Smokeless tobacco: Never Used  . Tobacco comment: 1-800- ouit # given  Substance Use Topics  . Alcohol use: No    Comment: socially before pregancy  . Drug use: No     Allergies   Darvon [propoxyphene], Percocet [oxycodone-acetaminophen], and Sulfa drugs cross reactors   Review of Systems Review of Systems  Constitutional: Positive for fatigue. Negative for activity change, appetite change, diaphoresis and fever.  HENT: Negative.   Eyes: Negative.   Respiratory: Negative.   Cardiovascular: Negative.   Gastrointestinal: Positive for abdominal pain and nausea. Negative for blood in stool, constipation, diarrhea and vomiting.  Genitourinary: Negative.   Musculoskeletal:  Negative.   Skin: Negative.   Neurological: Negative.      Physical Exam Triage Vital Signs ED Triage Vitals  Enc Vitals Group     BP      Pulse      Resp      Temp      Temp src      SpO2      Weight      Height      Head Circumference      Peak Flow      Pain Score      Pain Loc      Pain Edu?      Excl. in GC?    No data found.  Updated Vital Signs BP 112/78 (BP Location: Right Arm)   Pulse 83   Temp 98.1 F (36.7 C)   Resp 18   Wt 81.6 kg   SpO2 100%   BMI 31.89 kg/m    Physical Exam Vitals signs and nursing note reviewed.  Constitutional:      General: She is not in acute distress.    Appearance: Normal appearance. She is not ill-appearing.  HENT:     Head: Normocephalic.     Nose: Nose normal.     Mouth/Throat:     Pharynx: Oropharynx is clear.  Eyes:     Conjunctiva/sclera: Conjunctivae normal.  Neck:     Musculoskeletal: Normal range of motion and neck supple.  Cardiovascular:     Rate and Rhythm: Normal rate and regular rhythm.  Pulmonary:     Effort: Pulmonary effort is normal.     Breath sounds: Normal breath sounds.  Abdominal:     Comments: Increased BS Mild epigastric tenderness with deep palpation  Musculoskeletal: Normal range of motion.  Skin:    General: Skin is warm and dry.  Neurological:     General: No focal deficit present.     Mental Status: She is alert and oriented to person, place, and time.  Psychiatric:        Mood and Affect: Mood normal.        Behavior: Behavior normal.        Thought Content: Thought content normal.        Judgment: Judgment normal.      UC Treatments / Results  Labs (all labs ordered are listed, but only abnormal results are displayed) Labs Reviewed  CBC WITH DIFFERENTIAL/PLATELET  COMPREHENSIVE METABOLIC PANEL    EKG   Radiology No results found.  Procedures Procedures (including  critical care time)  Medications Ordered in UC Medications - No data to display  Initial  Impression / Assessment and Plan / UC Course  I have reviewed the triage vital signs and the nursing notes.  Pertinent labs & imaging results that were available during my care of the patient were reviewed by me and considered in my medical decision making (see chart for details).    Final Clinical Impressions(s) / UC Diagnoses   Final diagnoses:  Noninfectious gastroenteritis, unspecified type     Discharge Instructions     Avoid dairy products for 5 days    ED Prescriptions    Medication Sig Dispense Auth. Provider   metroNIDAZOLE (FLAGYL) 500 MG tablet Take 1 tablet (500 mg total) by mouth 2 (two) times daily. 10 tablet Elvina SidleLauenstein, Dyron Kawano, MD   Hyoscyamine Sulfate SL (LEVSIN/SL) 0.125 MG SUBL Place 1 tablet under the tongue every 8 (eight) hours as needed. 10 tablet Elvina SidleLauenstein, Gunda Maqueda, MD     I have reviewed the PDMP during this encounter.   Elvina SidleLauenstein, Bohden Dung, MD 05/18/19 1044    Elvina SidleLauenstein, Franceska Strahm, MD 05/18/19 1045

## 2019-05-19 ENCOUNTER — Telehealth (HOSPITAL_COMMUNITY): Payer: Self-pay | Admitting: Emergency Medicine

## 2019-05-19 MED ORDER — FLUCONAZOLE 150 MG PO TABS: 150.0000 mg | ORAL_TABLET | Freq: Once | ORAL | 0 refills | Status: AC

## 2019-05-19 NOTE — Telephone Encounter (Signed)
Pt returned call, notified her of unremarkable labs. Pt requesting yeast infection pills due to taking the flagyl. Okay to send per protocol.

## 2019-05-19 NOTE — Telephone Encounter (Signed)
Reviewed with Dr. Meda Coffee, no significant abnormalities on blood work. Attempted to reach patient, no answer.

## 2020-07-09 ENCOUNTER — Ambulatory Visit (HOSPITAL_COMMUNITY)
Admission: EM | Admit: 2020-07-09 | Discharge: 2020-07-09 | Disposition: A | Discharge status: Home / Self Care | Payer: Managed Care, Other (non HMO) | Attending: Physician Assistant | Admitting: Physician Assistant

## 2020-07-09 ENCOUNTER — Encounter (HOSPITAL_COMMUNITY): Payer: Self-pay

## 2020-07-09 ENCOUNTER — Other Ambulatory Visit: Payer: Self-pay

## 2020-07-09 DIAGNOSIS — B9689 Other specified bacterial agents as the cause of diseases classified elsewhere: Secondary | ICD-10-CM

## 2020-07-09 DIAGNOSIS — N760 Acute vaginitis: Secondary | ICD-10-CM

## 2020-07-09 DIAGNOSIS — B009 Herpesviral infection, unspecified: Secondary | ICD-10-CM | POA: Insufficient documentation

## 2020-07-09 MED ORDER — METRONIDAZOLE 0.75 % VA GEL: 1.0000 | Freq: Every day | VAGINAL | 0 refills | Status: AC

## 2020-07-09 MED ORDER — VALACYCLOVIR HCL 1 G PO TABS: 1000.0000 mg | ORAL_TABLET | Freq: Every day | ORAL | 1 refills | Status: DC | PRN

## 2020-07-09 NOTE — ED Provider Notes (Addendum)
[facility]    CSN: [id number] Arrival date & time: 07/09/20  1043      History   Chief Complaint Chief Complaint  Patient presents with  . Vaginal Discharge    HPI Julia Bautista is a 39 y.o. female.   Pt complains of vaginal discharge, thing watery, and foul odor that started about 4 days ago. She denies vaginal itching, pelvic pain, abdominal pain, dysuria.  She reports she is not sexually active, no concern regarding STIs.  She has tried nothing for the sx. Pt with h/o herpes, reports recent outbreak. Requests refill of Valtrex.      Past Medical History:  Diagnosis Date  . Abnormal Pap smear   . Asthma    PRN inhaler  . Herpes   . Other acne    ectopic pregnancy.     Patient Active Problem List   Diagnosis Date Noted  . history of Herpes 06/11/2016  . History of loop electrical excision procedure (LEEP) 06/11/2016  . S/P repeat cesarean section 06/11/2016  . History of abnormal cervical Pap smear 01/25/2016    Past Surgical History:  Procedure Laterality Date  . CESAREAN SECTION  2005  . CESAREAN SECTION N/A 01/07/2017   Procedure: CESAREAN SECTION;  Surgeon: Willodean Rosenthal, MD;  Location: [facility] BIRTHING SUIT[facility];  Service: Obstetrics;  Laterality: N/A;  . COLPOSCOPY  2013  . FINGER SURGERY    . LEEP  2013  . ROTATOR CUFF REPAIR    . TOOTH EXTRACTION    . UNILATERAL SALPINGECTOMY Right 2008   via mini-lap    OB History    Gravida  7   Para  2   Term  2   Preterm      AB  5   Living  2     SAB  2   IAB  2   Ectopic  1   Multiple  0   Live Births  2        Obstetric Comments  EAB x 3 (all D&Cs), 2015 pLTCS @ 6cm per patient, SAB x 2         Home Medications    Prior to Admission medications   Medication Sig Start Date End Date Taking? Authorizing Provider  acetaminophen (TYLENOL) 500 MG tablet Take 1,000 mg by mouth every 6 (six) hours as needed for mild pain.     [provider]  albuterol  (PROVENTIL HFA;VENTOLIN HFA) 108 (90 Base) MCG/ACT inhaler Inhale 2 puffs into the lungs every 6 (six) hours as needed for wheezing or shortness of breath. Reported on 01/25/2016    [provider]  Hyoscyamine Sulfate SL (LEVSIN/SL) 0.125 MG SUBL Place 1 tablet under the tongue every 8 (eight) hours as needed. 05/18/19   Elvina Sidle, MD  metroNIDAZOLE (FLAGYL) 500 MG tablet Take 1 tablet (500 mg total) by mouth 2 (two) times daily. 05/18/19   Elvina Sidle, MD  OVER THE COUNTER MEDICATION Take 1 capsule by mouth daily. Patient takes the herbal supplement Sea Moss.    [provider]  Probiotic Product (PROBIOTIC DAILY) CAPS Take 1 capsule by mouth daily.    [provider]  valACYclovir (VALTREX) 1000 MG tablet Take 1 tablet (1,000 mg total) by mouth daily. Patient taking differently: Take 1,000 mg by mouth daily as needed (For outbreak.). 08/12/17   Leftwich-Kirby, Wilmer Floor, CNM    Family History Family History  Problem Relation Age of Onset  . Diabetes Mother   . Hypertension  Sister     Social History Social History   Tobacco Use  . Smoking status: Former Smoker    Packs/day: 0.00    Years: 12.00    Pack years: 0.00    Types: E-cigarettes    Quit date: 06/04/2016    Years since quitting: 4.0  . Smokeless tobacco: Never Used  . Tobacco comment: 1-800- ouit # given  Substance Use Topics  . Alcohol use: No    Comment: socially before pregancy  . Drug use: No     Allergies   Darvon [propoxyphene], Percocet [oxycodone-acetaminophen], and Sulfa drugs cross reactors   Review of Systems Review of Systems  Constitutional: Negative for chills and fever.  HENT: Negative for ear pain and sore throat.   Eyes: Negative for pain and visual disturbance.  Respiratory: Negative for cough and shortness of breath.   Cardiovascular: Negative for chest pain and palpitations.  Gastrointestinal: Negative for abdominal pain and vomiting.  Genitourinary:  Positive for vaginal discharge. Negative for dysuria, hematuria, pelvic pain, vaginal bleeding and vaginal pain.  Musculoskeletal: Negative for arthralgias and back pain.  Skin: Negative for color change and rash.  Neurological: Negative for seizures and syncope.  All other systems reviewed and are negative.    Physical Exam Triage Vital Signs ED Triage Vitals  Enc Vitals Group     BP 07/09/20 1221 115/84     Pulse Rate 07/09/20 1221 72     Resp 07/09/20 1221 15     Temp 07/09/20 1221 98.3 F (36.8 C)     Temp Source 07/09/20 1221 Oral     SpO2 07/09/20 1221 100 %     Weight --      Height --      Head Circumference --      Peak Flow --      Pain Score 07/09/20 1224 0     Pain Loc --      Pain Edu? --      Excl. in GC? --    No data found.  Updated Vital Signs BP 115/84 (BP Location: Left Arm)   Pulse 72   Temp 98.3 F (36.8 C) (Oral)   Resp 15   LMP  (Within Weeks) Comment: 1 week  SpO2 100%   Visual Acuity Right Eye Distance:   Left Eye Distance:   Bilateral Distance:    Right Eye Near:   Left Eye Near:    Bilateral Near:     Physical Exam Vitals and nursing note reviewed.  Constitutional:      General: She is not in acute distress.    Appearance: She is well-developed and well-nourished.  HENT:     Head: Normocephalic and atraumatic.  Eyes:     Conjunctiva/sclera: Conjunctivae normal.  Cardiovascular:     Rate and Rhythm: Normal rate and regular rhythm.     Heart sounds: No murmur heard.   Pulmonary:     Effort: Pulmonary effort is normal. No respiratory distress.     Breath sounds: Normal breath sounds.  Abdominal:     Palpations: Abdomen is soft.     Tenderness: There is no abdominal tenderness.  Musculoskeletal:        General: No edema.     Cervical back: Neck supple.  Skin:    General: Skin is warm and dry.  Neurological:     Mental Status: She is alert.  Psychiatric:        Mood and Affect: Mood and affect normal.  UC  Treatments / Results  Labs (all labs ordered are listed, but only abnormal results are displayed) Labs Reviewed - No data to display  EKG   Radiology No results found.  Procedures Procedures (including critical care time)  Medications Ordered in UC Medications - No data to display  Initial Impression / Assessment and Plan / UC Course  I have reviewed the triage vital signs and the nursing notes.  Pertinent labs & imaging results that were available during my care of the patient were reviewed by me and considered in my medical decision making (see chart for details).     Denies pelvic pain, abdominal pain.  Pt self swab in clinic.  Will treat for BV. Pt request metrogel instead of tablets.   Final Clinical Impressions(s) / UC Diagnoses   Final diagnoses:  None   Discharge Instructions   None    ED Prescriptions    None     PDMP not reviewed this encounter.   Jodell Cipro, PA-C 07/09/20 1239    Jodell Cipro, PA-C 07/09/20 1253

## 2020-07-09 NOTE — Discharge Instructions (Addendum)
Use medication as prescribed. °

## 2020-07-09 NOTE — ED Triage Notes (Signed)
Pt presents with white vaginal discharge with fishy smell x 4 days. Denies dysuria, fever, abdominal pain.

## 2020-07-11 LAB — CERVICOVAGINAL ANCILLARY ONLY
Bacterial Vaginitis (gardnerella): POSITIVE — AB
Candida Glabrata: NEGATIVE
Candida Vaginitis: NEGATIVE
Chlamydia: NEGATIVE
Comment: NEGATIVE
Comment: NEGATIVE
Comment: NEGATIVE
Comment: NEGATIVE
Comment: NEGATIVE
Comment: NORMAL
Neisseria Gonorrhea: NEGATIVE
Trichomonas: NEGATIVE

## 2020-12-18 ENCOUNTER — Ambulatory Visit (HOSPITAL_COMMUNITY)
Admission: EM | Admit: 2020-12-18 | Discharge: 2020-12-18 | Disposition: A | Discharge status: Home / Self Care | Payer: Self-pay | Attending: Emergency Medicine | Admitting: Emergency Medicine

## 2020-12-18 ENCOUNTER — Encounter (HOSPITAL_COMMUNITY): Payer: Self-pay | Admitting: Emergency Medicine

## 2020-12-18 ENCOUNTER — Other Ambulatory Visit: Payer: Self-pay

## 2020-12-18 DIAGNOSIS — R21 Rash and other nonspecific skin eruption: Secondary | ICD-10-CM

## 2020-12-18 MED ORDER — TRIAMCINOLONE ACETONIDE 0.1 % EX CREA: 1.0000 "application " | TOPICAL_CREAM | Freq: Two times a day (BID) | CUTANEOUS | 0 refills | Status: AC

## 2020-12-18 MED ORDER — CETIRIZINE HCL 10 MG PO TABS: 10.0000 mg | ORAL_TABLET | Freq: Every day | ORAL | 0 refills | Status: AC

## 2020-12-18 NOTE — ED Provider Notes (Signed)
[facility]    CSN: [id number] Arrival date & time: 12/18/20  1509      History   Chief Complaint Chief Complaint  Patient presents with  . Rash    HPI Julia Bautista is a 40 y.o. female.   Patient here for evaluation of rash that has been ongoing for the past several weeks.  Reports trying a new soap to help with symptoms.  Reports rash located on bilateral upper extremities and has now spread to upper lower extremities.  Denies any pain but reports itchy.  Reports using hydrocortisone cream with minimal relief.  No other family members have similar symptoms.  Denies any trauma, injury, or other precipitating event.  Denies any specific alleviating or aggravating factors.  Denies any fevers, chest pain, shortness of breath, N/V/D, numbness, tingling, weakness, abdominal pain, or headaches.     The history is provided by the patient.  Rash   Past Medical History:  Diagnosis Date  . Abnormal Pap smear   . Asthma    PRN inhaler  . Herpes   . Other acne    ectopic pregnancy.     Patient Active Problem List   Diagnosis Date Noted  . history of Herpes 06/11/2016  . History of loop electrical excision procedure (LEEP) 06/11/2016  . S/P repeat cesarean section 06/11/2016  . History of abnormal cervical Pap smear 01/25/2016    Past Surgical History:  Procedure Laterality Date  . CESAREAN SECTION  2005  . CESAREAN SECTION N/A 01/07/2017   Procedure: CESAREAN SECTION;  Surgeon: Willodean Rosenthal, MD;  Location: [facility]THING SU[facility];  Service: Obstetrics;  Laterality: N/A;  . COLPOSCOPY  2013  . FINGER SURGERY    . LEEP  2013  . ROTATOR CUFF REPAIR    . TOOTH EXTRACTION    . UNILATERAL SALPINGECTOMY Right 2008   via mini-lap    OB History    Gravida  7   Para  2   Term  2   Preterm      AB  5   Living  2     SAB  2   IAB  2   Ectopic  1   Multiple  0   Live Births  2        Obstetric Comments  EAB x 3 (all D&Cs), 2015 pLTCS @ 6cm  per patient, SAB x 2         Home Medications    Prior to Admission medications   Medication Sig Start Date End Date Taking? Authorizing Provider  cetirizine (ZYRTEC ALLERGY) 10 MG tablet Take 1 tablet (10 mg total) by mouth daily. 12/18/20  Yes Ivette Loyal, NP  triamcinolone cream (KENALOG) 0.1 % Apply 1 application topically 2 (two) times daily. 12/18/20  Yes Ivette Loyal, NP  acetaminophen (TYLENOL) 500 MG tablet Take 1,000 mg by mouth every 6 (six) hours as needed for mild pain.     [provider]  albuterol (PROVENTIL HFA;VENTOLIN HFA) 108 (90 Base) MCG/ACT inhaler Inhale 2 puffs into the lungs every 6 (six) hours as needed for wheezing or shortness of breath. Reported on 01/25/2016    [provider]  Hyoscyamine Sulfate SL (LEVSIN/SL) 0.125 MG SUBL Place 1 tablet under the tongue every 8 (eight) hours as needed. 05/18/19   Elvina Sidle, MD  metroNIDAZOLE (FLAGYL) 500 MG tablet Take 1 tablet (500 mg total) by mouth 2 (two) times daily. 05/18/19   Elvina Sidle, MD  OVER THE  COUNTER MEDICATION Take 1 capsule by mouth daily. Patient takes the herbal supplement Sea Moss.    [provider]  Probiotic Product (PROBIOTIC DAILY) CAPS Take 1 capsule by mouth daily.    [provider]  valACYclovir (VALTREX) 1000 MG tablet Take 1 tablet (1,000 mg total) by mouth daily as needed (For outbreak.). 07/09/20   Jodell Cipro, PA-C    Family History Family History  Problem Relation Age of Onset  . Diabetes Mother   . Hypertension Sister     Social History Social History   Tobacco Use  . Smoking status: Former Smoker    Packs/day: 0.00    Years: 12.00    Pack years: 0.00    Types: E-cigarettes    Quit date: 06/04/2016    Years since quitting: 4.5  . Smokeless tobacco: Never Used  . Tobacco comment: 1-800- ouit # given  Substance Use Topics  . Alcohol use: No    Comment: socially before pregancy  . Drug use: No     Allergies    Darvon [propoxyphene], Percocet [oxycodone-acetaminophen], and Sulfa drugs cross reactors   Review of Systems Review of Systems  Skin: Positive for rash.  All other systems reviewed and are negative.    Physical Exam Triage Vital Signs ED Triage Vitals  Enc Vitals Group     BP 12/18/20 1604 135/82     Pulse Rate 12/18/20 1604 82     Resp 12/18/20 1604 17     Temp 12/18/20 1604 98.6 F (37 C)     Temp Source 12/18/20 1604 Oral     SpO2 12/18/20 1604 99 %     Weight --      Height --      Head Circumference --      Peak Flow --      Pain Score 12/18/20 1601 0     Pain Loc --      Pain Edu? --      Excl. in GC? --    No data found.  Updated Vital Signs BP 135/82 (BP Location: Right Arm)   Pulse 82   Temp 98.6 F (37 C) (Oral)   Resp 17   LMP 12/07/2020   SpO2 99%   Visual Acuity Right Eye Distance:   Left Eye Distance:   Bilateral Distance:    Right Eye Near:   Left Eye Near:    Bilateral Near:     Physical Exam Vitals and nursing note reviewed.  Constitutional:      General: Julia Bautista is not in acute distress.    Appearance: Normal appearance. Julia Bautista is not ill-appearing, toxic-appearing or diaphoretic.  HENT:     Head: Normocephalic and atraumatic.  Eyes:     Conjunctiva/sclera: Conjunctivae normal.  Cardiovascular:     Rate and Rhythm: Normal rate.     Pulses: Normal pulses.  Pulmonary:     Effort: Pulmonary effort is normal.  Abdominal:     General: Abdomen is flat.  Musculoskeletal:        General: Normal range of motion.     Cervical back: Normal range of motion.  Skin:    General: Skin is warm and dry.     Findings: Rash present. Rash is urticarial (bilateral upper extremities and upper legs).  Neurological:     General: No focal deficit present.     Mental Status: Julia Bautista is alert and oriented to person, place, and time.  Psychiatric:        Mood and  Affect: Mood normal.      UC Treatments / Results  Labs (all labs ordered are listed, but  only abnormal results are displayed) Labs Reviewed - No data to display  EKG   Radiology No results found.  Procedures Procedures (including critical care time)  Medications Ordered in UC Medications - No data to display  Initial Impression / Assessment and Plan / UC Course  I have reviewed the triage vital signs and the nursing notes.  Pertinent labs & imaging results that were available during my care of the patient were reviewed by me and considered in my medical decision making (see chart for details).    Assessment negative for red flags or concerns.  Likely contact dermatitis.  Recommend daily zyrtec and kenalog cream twice a day as needed.  Avoid hot water. Use lotions and soaps that are gentle on sensitive skin and unscented.  Follow up if symptoms worsen or do not improve.   Final Clinical Impressions(s) / UC Diagnoses   Final diagnoses:  Rash and nonspecific skin eruption     Discharge Instructions     Take the Cetirizine daily.  Apply the Kenalog cream twice a day as needed for itching.    Avoid hot water showers and baths.  Use lotions and soaps that are unscented and good for sensitive skin.     Return or go to the Emergency Department if symptoms worsen or do not improve in the next few days.     ED Prescriptions    Medication Sig Dispense Auth. Provider   cetirizine (ZYRTEC ALLERGY) 10 MG tablet Take 1 tablet (10 mg total) by mouth daily. 14 tablet Chales Salmon R, NP   triamcinolone cream (KENALOG) 0.1 % Apply 1 application topically 2 (two) times daily. 30 g Ivette Loyal, NP     PDMP not reviewed this encounter.   Ivette Loyal, NP 12/18/20 [phone]

## 2020-12-18 NOTE — ED Triage Notes (Signed)
Pt presents with possible rash all over body  xs 3 weeks. States has changed to new soap to try to help and no results. States has hx of sensitive skin. States not painful but does itch.

## 2020-12-18 NOTE — Discharge Instructions (Signed)
Take the Cetirizine daily.  Apply the Kenalog cream twice a day as needed for itching.    Avoid hot water showers and baths.  Use lotions and soaps that are unscented and good for sensitive skin.     Return or go to the Emergency Department if symptoms worsen or do not improve in the next few days.

## 2021-03-07 ENCOUNTER — Other Ambulatory Visit: Payer: Self-pay

## 2021-03-07 ENCOUNTER — Encounter (HOSPITAL_COMMUNITY): Payer: Self-pay

## 2021-03-07 ENCOUNTER — Emergency Department (HOSPITAL_COMMUNITY)
Admission: EM | Admit: 2021-03-07 | Discharge: 2021-03-07 | Disposition: A | Discharge status: Home / Self Care | Payer: Self-pay | Attending: Emergency Medicine | Admitting: Emergency Medicine

## 2021-03-07 DIAGNOSIS — N9489 Other specified conditions associated with female genital organs and menstrual cycle: Secondary | ICD-10-CM | POA: Insufficient documentation

## 2021-03-07 DIAGNOSIS — R55 Syncope and collapse: Secondary | ICD-10-CM | POA: Insufficient documentation

## 2021-03-07 DIAGNOSIS — N939 Abnormal uterine and vaginal bleeding, unspecified: Secondary | ICD-10-CM | POA: Insufficient documentation

## 2021-03-07 DIAGNOSIS — Z87891 Personal history of nicotine dependence: Secondary | ICD-10-CM | POA: Insufficient documentation

## 2021-03-07 DIAGNOSIS — N938 Other specified abnormal uterine and vaginal bleeding: Secondary | ICD-10-CM

## 2021-03-07 DIAGNOSIS — R110 Nausea: Secondary | ICD-10-CM | POA: Insufficient documentation

## 2021-03-07 DIAGNOSIS — J45909 Unspecified asthma, uncomplicated: Secondary | ICD-10-CM | POA: Insufficient documentation

## 2021-03-07 DIAGNOSIS — R1031 Right lower quadrant pain: Secondary | ICD-10-CM | POA: Insufficient documentation

## 2021-03-07 LAB — CBC WITH DIFFERENTIAL/PLATELET
Abs Immature Granulocytes: 0.02 10*3/uL (ref 0.00–0.07)
Basophils Absolute: 0 10*3/uL (ref 0.0–0.1)
Basophils Relative: 0 %
Eosinophils Absolute: 0.1 10*3/uL (ref 0.0–0.5)
Eosinophils Relative: 1 %
HCT: 37.1 % (ref 36.0–46.0)
Hemoglobin: 11.3 g/dL — ABNORMAL LOW (ref 12.0–15.0)
Immature Granulocytes: 0 %
Lymphocytes Relative: 9 %
Lymphs Abs: 0.6 10*3/uL — ABNORMAL LOW (ref 0.7–4.0)
MCH: 25.2 pg — ABNORMAL LOW (ref 26.0–34.0)
MCHC: 30.5 g/dL (ref 30.0–36.0)
MCV: 82.6 fL (ref 80.0–100.0)
Monocytes Absolute: 0.3 10*3/uL (ref 0.1–1.0)
Monocytes Relative: 4 %
Neutro Abs: 5.5 10*3/uL (ref 1.7–7.7)
Neutrophils Relative %: 86 %
Platelets: 249 10*3/uL (ref 150–400)
RBC: 4.49 MIL/uL (ref 3.87–5.11)
RDW: 12.8 % (ref 11.5–15.5)
WBC: 6.5 10*3/uL (ref 4.0–10.5)
nRBC: 0 % (ref 0.0–0.2)

## 2021-03-07 LAB — COMPREHENSIVE METABOLIC PANEL
ALT: 10 U/L (ref 0–44)
AST: 22 U/L (ref 15–41)
Albumin: 4.6 g/dL (ref 3.5–5.0)
Alkaline Phosphatase: 35 U/L — ABNORMAL LOW (ref 38–126)
Anion gap: 7 (ref 5–15)
BUN: 10 mg/dL (ref 6–20)
CO2: 27 mmol/L (ref 22–32)
Calcium: 9.2 mg/dL (ref 8.9–10.3)
Chloride: 104 mmol/L (ref 98–111)
Creatinine, Ser: 0.81 mg/dL (ref 0.44–1.00)
GFR, Estimated: >60 mL/min (ref >60)
Glucose, Bld: 85 mg/dL (ref 70–99)
Potassium: 3.9 mmol/L (ref 3.5–5.1)
Sodium: 138 mmol/L (ref 135–145)
Total Bilirubin: 0.6 mg/dL (ref 0.3–1.2)
Total Protein: 8.2 g/dL — ABNORMAL HIGH (ref 6.5–8.1)

## 2021-03-07 LAB — URINALYSIS, ROUTINE W REFLEX MICROSCOPIC
Bacteria, UA: NONE SEEN
Bilirubin Urine: NEGATIVE
Glucose, UA: NEGATIVE mg/dL
Ketones, ur: NEGATIVE mg/dL
Leukocytes,Ua: NEGATIVE
Nitrite: NEGATIVE
Protein, ur: 100 mg/dL — AB
RBC / HPF: >50 RBC/hpf — ABNORMAL HIGH (ref 0–5)
Specific Gravity, Urine: 1.026 (ref 1.005–1.030)
pH: 6 (ref 5.0–8.0)

## 2021-03-07 LAB — LIPASE, BLOOD: Lipase: 39 U/L (ref 11–51)

## 2021-03-07 LAB — HCG, QUANTITATIVE, PREGNANCY: hCG, Beta Chain, Quant, S: <1 m[IU]/mL (ref <5)

## 2021-03-07 LAB — WET PREP, GENITAL
Clue Cells Wet Prep HPF POC: NONE SEEN
Sperm: NONE SEEN
Trich, Wet Prep: NONE SEEN
Yeast Wet Prep HPF POC: NONE SEEN

## 2021-03-07 LAB — PREGNANCY, URINE: Preg Test, Ur: NEGATIVE

## 2021-03-07 MED ORDER — NITROFURANTOIN MONOHYD MACRO 100 MG PO CAPS: 100.0000 mg | ORAL_CAPSULE | Freq: Two times a day (BID) | ORAL | 0 refills | Status: DC

## 2021-03-07 NOTE — Discharge Instructions (Addendum)
You are seen in the ER for bleeding and abdominal pain.  The pregnancy test, both blood and urine is normal.  Urine does not reveal any clear evidence of infection, but we suspect clinically that you have a UTI and are starting on antibiotics.  If you start having worsening pain, worsening bleeding please return to the ER otherwise please call the gynecologist for an outpatient follow-up.  Take ibuprofen for pain control.

## 2021-03-07 NOTE — ED Provider Notes (Signed)
[facility]-EMERGENCY DEPT Provider Note   CSN: [id number] Arrival date & time: 03/07/21  4098     History Chief Complaint  Patient presents with   Abdominal Pain    Julia Bautista is a 40 y.o. female.  HPI     Pt comes in with cc of RLQ abd pain. Pt has hx of asthma, abnml PAP. Patient's pain started this morning after she woke up. Took ibuprofen, with some relief. Pt has some burning with urination and started her menstrual cycle early. + nausea and no emesis.   Past Medical History:  Diagnosis Date   Abnormal Pap smear    Asthma    PRN inhaler   Herpes    Other acne    ectopic pregnancy.     Patient Active Problem List   Diagnosis Date Noted   history of Herpes 06/11/2016   History of loop electrical excision procedure (LEEP) 06/11/2016   S/P repeat cesarean section 06/11/2016   History of abnormal cervical Pap smear 01/25/2016    Past Surgical History:  Procedure Laterality Date   CESAREAN SECTION  2005   CESAREAN SECTION N/A 01/07/2017   Procedure: CESAREAN SECTION;  Surgeon: Willodean Rosenthal, MD;  Location: [facility]THING SUIT[facility];  Service: Obstetrics;  Laterality: N/A;   COLPOSCOPY  2013   FINGER SURGERY     LEEP  2013   ROTATOR CUFF REPAIR     TOOTH EXTRACTION     UNILATERAL SALPINGECTOMY Right 2008   via mini-lap     OB History     Gravida  7   Para  2   Term  2   Preterm      AB  5   Living  2      SAB  2   IAB  2   Ectopic  1   Multiple  0   Live Births  2        Obstetric Comments  EAB x 3 (all D&Cs), 2015 pLTCS @ 6cm per patient, SAB x 2         Family History  Problem Relation Age of Onset   Diabetes Mother    Hypertension Sister     Social History   Tobacco Use   Smoking status: Former    Packs/day: 0.00    Years: 12.00    Pack years: 0.00    Types: E-cigarettes, Cigarettes    Quit date: 06/04/2016    Years since quitting: 4.7   Smokeless tobacco: Never   Tobacco comments:     1-800- ouit # given  Substance Use Topics   Alcohol use: No    Comment: socially before pregancy   Drug use: No    Home Medications Prior to Admission medications   Medication Sig Start Date End Date Taking? Authorizing Provider  acetaminophen (TYLENOL) 500 MG tablet Take 1,000 mg by mouth every 6 (six) hours as needed for mild pain.    Yes [provider]  albuterol (PROVENTIL HFA;VENTOLIN HFA) 108 (90 Base) MCG/ACT inhaler Inhale 2 puffs into the lungs every 6 (six) hours as needed for wheezing or shortness of breath. Reported on 01/25/2016   Yes [provider]  cetirizine (ZYRTEC ALLERGY) 10 MG tablet Take 1 tablet (10 mg total) by mouth daily. Patient taking differently: Take 10 mg by mouth daily as needed for allergies. 12/18/20  Yes Ivette Loyal, NP  ibuprofen (ADVIL) 200 MG tablet Take 400 mg by mouth every 6 (six) hours as  needed for mild pain.   Yes [provider]  nitrofurantoin, macrocrystal-monohydrate, (MACROBID) 100 MG capsule Take 1 capsule (100 mg total) by mouth 2 (two) times daily. 03/07/21  Yes Derwood Kaplan, MD  OVER THE COUNTER MEDICATION Take 1 capsule by mouth daily. Patient takes the herbal supplement Sea Moss.   Yes [provider]  triamcinolone cream (KENALOG) 0.1 % Apply 1 application topically 2 (two) times daily. Patient taking differently: Apply 1 application topically 2 (two) times daily as needed (dry skin). 12/18/20  Yes Ivette Loyal, NP  valACYclovir (VALTREX) 1000 MG tablet Take 1 tablet (1,000 mg total) by mouth daily as needed (For outbreak.). 07/09/20  Yes Ward, Tylene Fantasia, PA-C    Allergies    Darvon [propoxyphene], Percocet [oxycodone-acetaminophen], and Sulfa drugs cross reactors  Review of Systems   Review of Systems  Physical Exam Updated Vital Signs BP 118/78   Pulse 78   Temp 97.8 F (36.6 C) (Oral)   Resp 16   SpO2 98%   Physical Exam Vitals and nursing note reviewed. Exam conducted with a  chaperone present.  Constitutional:      Appearance: She is well-developed.  HENT:     Head: Normocephalic and atraumatic.  Eyes:     Extraocular Movements: Extraocular movements intact.     Conjunctiva/sclera: Conjunctivae normal.  Cardiovascular:     Rate and Rhythm: Normal rate and regular rhythm.     Heart sounds: Normal heart sounds.  Pulmonary:     Effort: Pulmonary effort is normal. No respiratory distress.  Abdominal:     General: Bowel sounds are normal. There is no distension.     Palpations: Abdomen is soft.     Tenderness: There is no abdominal tenderness. There is no guarding or rebound.  Genitourinary:    Vagina: Normal.     Comments: External exam - normal, no lesions Speculum exam: Pt has no discharge, + blood Bimanual exam: Patient has no CMT, R adnexal tenderness, cervical os is closed Musculoskeletal:     Cervical back: Normal range of motion and neck supple.  Skin:    General: Skin is warm and dry.  Neurological:     Mental Status: She is alert and oriented to person, place, and time.    ED Results / Procedures / Treatments   Labs (all labs ordered are listed, but only abnormal results are displayed) Labs Reviewed  WET PREP, GENITAL - Abnormal; Notable for the following components:      Result Value   WBC, Wet Prep HPF POC FEW (*)    All other components within normal limits  CBC WITH DIFFERENTIAL/PLATELET - Abnormal; Notable for the following components:   Hemoglobin 11.3 (*)    MCH 25.2 (*)    Lymphs Abs 0.6 (*)    All other components within normal limits  COMPREHENSIVE METABOLIC PANEL - Abnormal; Notable for the following components:   Total Protein 8.2 (*)    Alkaline Phosphatase 35 (*)    All other components within normal limits  URINALYSIS, ROUTINE W REFLEX MICROSCOPIC - Abnormal; Notable for the following components:   Color, Urine RED (*)    APPearance CLOUDY (*)    Hgb urine dipstick LARGE (*)    Protein, ur 100 (*)    RBC / HPF >50  (*)    All other components within normal limits  LIPASE, BLOOD  PREGNANCY, URINE  HCG, QUANTITATIVE, PREGNANCY  GC/CHLAMYDIA PROBE AMP ([place]) NOT AT [facility]    EKG  None  Radiology No results found.  Procedures Procedures   Medications Ordered in ED Medications - No data to display  ED Course  I have reviewed the triage vital signs and the nursing notes.  Pertinent labs & imaging results that were available during my care of the patient were reviewed by me and considered in my medical decision making (see chart for details).    MDM Rules/Calculators/A&P                           40 year old comes in with chief complaint of abdominal pain.  She had sudden onset of severe abdominal pain this morning.  She took ibuprofen and the pain is resolved.  Pain lasted for about 30 minutes.   On exam, she has right adnexal tenderness and right lower quadrant discomfort.  Negative McBurney's, negative Rovsing's.  No peritoneal findings.  Doubt its acute appendicitis given the timing.  Patient does indicate that she had a near fainting type episode prior to the pain.  That episode appears to be a vasovagal type episode and it was followed by the sudden, severe pain.  We did consider torsion and kidney stones in her differential.  There is no history of kidney stones in the family.  Her urine is showing positive RBCs, but patient is also having early period.  Given that she is also having some burning with urination, we did get a pelvic exam to screen for STDs.  Patient prefers waiting for the results before committing to any treatment.  We will treat her for clinical cystitis.  Considered miscarriage, ectopic in the differential as well.  Patient does not want ultrasound.  I added serum hCG, which is also negative so the suspicion or possibility of ectopic, especially in the setting of her pain resolving is quite low at this time.  She will follow-up with her PCP for DUB.  Final Clinical  Impression(s) / ED Diagnoses Final diagnoses:  DUB (dysfunctional uterine bleeding)  Vasovagal episode    Rx / DC Orders ED Discharge Orders          Ordered    nitrofurantoin, macrocrystal-monohydrate, (MACROBID) 100 MG capsule  2 times daily        03/07/21 1131             Derwood Kaplan, MD 03/07/21 1139

## 2021-03-07 NOTE — ED Triage Notes (Signed)
Pt arrived via POV, c/o right sided abd pain that she states is so intense it is making her feel weak. Coming and going. Denies any n/v or diarrhea. States she has had dark foul smelling urine that she noticed yesterday.

## 2021-03-08 LAB — GC/CHLAMYDIA PROBE AMP (~~LOC~~) NOT AT ARMC
Chlamydia: NEGATIVE
Comment: NEGATIVE
Comment: NORMAL
Neisseria Gonorrhea: NEGATIVE

## 2021-07-05 ENCOUNTER — Ambulatory Visit (HOSPITAL_COMMUNITY)
Admission: EM | Admit: 2021-07-05 | Discharge: 2021-07-05 | Disposition: A | Discharge status: Home / Self Care | Payer: Managed Care, Other (non HMO) | Attending: Family Medicine | Admitting: Family Medicine

## 2021-07-05 ENCOUNTER — Encounter (HOSPITAL_COMMUNITY): Payer: Self-pay | Admitting: Emergency Medicine

## 2021-07-05 ENCOUNTER — Other Ambulatory Visit: Payer: Self-pay

## 2021-07-05 DIAGNOSIS — M62830 Muscle spasm of back: Secondary | ICD-10-CM

## 2021-07-05 DIAGNOSIS — S39012A Strain of muscle, fascia and tendon of lower back, initial encounter: Secondary | ICD-10-CM | POA: Diagnosis not present

## 2021-07-05 LAB — POCT URINALYSIS DIPSTICK, ED / UC
Bilirubin Urine: NEGATIVE
Glucose, UA: NEGATIVE mg/dL
Hgb urine dipstick: NEGATIVE
Ketones, ur: NEGATIVE mg/dL
Leukocytes,Ua: NEGATIVE
Nitrite: NEGATIVE
Protein, ur: NEGATIVE mg/dL
Specific Gravity, Urine: 1.02 (ref 1.005–1.030)
Urobilinogen, UA: 0.2 mg/dL (ref 0.0–1.0)
pH: 7 (ref 5.0–8.0)

## 2021-07-05 LAB — POC URINE PREG, ED: Preg Test, Ur: NEGATIVE

## 2021-07-05 MED ORDER — TIZANIDINE HCL 4 MG PO TABS: 4.0000 mg | ORAL_TABLET | Freq: Every day | ORAL | 0 refills | Status: AC

## 2021-07-05 MED ORDER — METHYLPREDNISOLONE SODIUM SUCC 125 MG IJ SOLR
INTRAMUSCULAR | Status: AC
  Filled 2021-07-05: qty 2

## 2021-07-05 MED ORDER — METHYLPREDNISOLONE SODIUM SUCC 125 MG IJ SOLR
125.0000 mg | Freq: Once | INTRAMUSCULAR | Status: AC
  Administered 2021-07-05: 20:00:00 125 mg via INTRAMUSCULAR

## 2021-07-05 MED ORDER — KETOROLAC TROMETHAMINE 10 MG PO TABS: 10.0000 mg | ORAL_TABLET | Freq: Two times a day (BID) | ORAL | 0 refills | Status: DC | PRN

## 2021-07-05 NOTE — Discharge Instructions (Signed)
For anti-inflammatory action take Toradol twice daily as needed for pain. Continue applying heat to help with back pain. Tizanidine as a muscle relaxer taken only at bedtime as it causes severe drowsiness.  You may take a dose during the day as long as she do not have to operate any machinery or drive.

## 2021-07-05 NOTE — ED Provider Notes (Signed)
[facility]    CSN: [id number] Arrival date & time: 07/05/21  1802      History   Chief Complaint Chief Complaint  Patient presents with   Back Pain    HPI Julia Bautista is a 40 y.o. female.   HPI Patient presents today with lumbar back pain bilaterally x5 days.  She denies any known injury.  She reports the only thing she recalls doing is wearing some different shoes on Sunday however does not recall any missteps or any movements which would have precipitated pain.  She reports later within that day experiencing some mild muscle spasms which have persisted into a persistent ache and intermittent muscle spasm which are increasingly painful.  She denies that the back pain is radiating into her buttocks or any weakness of the lower legs.  She is not having any issues with defecation or urination.  She has taken over-the-counter medication and use a heating pad without relief of symptoms. Past Medical History:  Diagnosis Date   Abnormal Pap smear    Asthma    PRN inhaler   Herpes    Other acne    ectopic pregnancy.     Patient Active Problem List   Diagnosis Date Noted   history of Herpes 06/11/2016   History of loop electrical excision procedure (LEEP) 06/11/2016   S/P repeat cesarean section 06/11/2016   History of abnormal cervical Pap smear 01/25/2016    Past Surgical History:  Procedure Laterality Date   CESAREAN SECTION  2005   CESAREAN SECTION N/A 01/07/2017   Procedure: CESAREAN SECTION;  Surgeon: Willodean Rosenthal, MD;  Location: [facility] BIRTHING SUITES;  Service: Obstetrics;  Laterality: N/A;   COLPOSCOPY  2013   FINGER SURGERY     LEEP  2013   ROTATOR CUFF REPAIR     TOOTH EXTRACTION     UNILATERAL SALPINGECTOMY Right 2008   via mini-lap    OB History     Gravida  7   Para  2   Term  2   Preterm      AB  5   Living  2      SAB  2   IAB  2   Ectopic  1   Multiple  0   Live Births  2        Obstetric Comments  EAB x  3 (all D&Cs), 2015 pLTCS @ 6cm per patient, SAB x 2          Home Medications    Prior to Admission medications   Medication Sig Start Date End Date Taking? Authorizing Provider  ketorolac (TORADOL) 10 MG tablet Take 1 tablet (10 mg total) by mouth every 12 (twelve) hours as needed. 07/05/21  Yes Bing Neighbors, FNP  tiZANidine (ZANAFLEX) 4 MG tablet Take 1 tablet (4 mg total) by mouth at bedtime. 07/05/21  Yes Bing Neighbors, FNP  acetaminophen (TYLENOL) 500 MG tablet Take 1,000 mg by mouth every 6 (six) hours as needed for mild pain.     [provider]  albuterol (PROVENTIL HFA;VENTOLIN HFA) 108 (90 Base) MCG/ACT inhaler Inhale 2 puffs into the lungs every 6 (six) hours as needed for wheezing or shortness of breath. Reported on 01/25/2016    [provider]  cetirizine (ZYRTEC ALLERGY) 10 MG tablet Take 1 tablet (10 mg total) by mouth daily. Patient taking differently: Take 10 mg by mouth daily as needed for allergies. 12/18/20   Ivette Loyal, NP  nitrofurantoin, macrocrystal-monohydrate, (MACROBID) 100 MG capsule Take 1 capsule (100 mg total) by mouth 2 (two) times daily. 03/07/21   Derwood Kaplan, MD  OVER THE COUNTER MEDICATION Take 1 capsule by mouth daily. Patient takes the herbal supplement Sea Moss.    [provider]  triamcinolone cream (KENALOG) 0.1 % Apply 1 application topically 2 (two) times daily. Patient taking differently: Apply 1 application topically 2 (two) times daily as needed (dry skin). 12/18/20   Ivette Loyal, NP  valACYclovir (VALTREX) 1000 MG tablet Take 1 tablet (1,000 mg total) by mouth daily as needed (For outbreak.). 07/09/20   Ward, Tylene Fantasia, PA-C    Family History Family History  Problem Relation Age of Onset   Diabetes Mother    Hypertension Sister     Social History Social History   Tobacco Use   Smoking status: Former    Packs/day: 0.00    Years: 12.00    Pack years: 0.00    Types: E-cigarettes,  Cigarettes    Quit date: 06/04/2016    Years since quitting: 5.0   Smokeless tobacco: Never   Tobacco comments:    1-800- ouit # given  Substance Use Topics   Alcohol use: No    Comment: socially before pregancy   Drug use: No     Allergies   Darvon [propoxyphene], Percocet [oxycodone-acetaminophen], and Sulfa drugs cross reactors   Review of Systems Review of Systems Pertinent negatives listed in HPI   Physical Exam Triage Vital Signs ED Triage Vitals  Enc Vitals Group     BP 07/05/21 1838 118/84     Pulse Rate 07/05/21 1838 78     Resp 07/05/21 1838 17     Temp 07/05/21 1838 99 F (37.2 C)     Temp Source 07/05/21 1838 Oral     SpO2 07/05/21 1838 98 %     Weight --      Height --      Head Circumference --      Peak Flow --      Pain Score 07/05/21 1837 6     Pain Loc --      Pain Edu? --      Excl. in GC? --    No data found.  Updated Vital Signs BP 118/84 (BP Location: Left Arm)    Pulse 78    Temp 99 F (37.2 C) (Oral)    Resp 17    SpO2 98%   Visual Acuity Right Eye Distance:   Left Eye Distance:   Bilateral Distance:    Right Eye Near:   Left Eye Near:    Bilateral Near:     Physical Exam Constitutional:      Appearance: Normal appearance.  HENT:     Head: Normocephalic and atraumatic.  Cardiovascular:     Rate and Rhythm: Normal rate and regular rhythm.  Pulmonary:     Effort: Pulmonary effort is normal.     Breath sounds: Normal breath sounds.  Musculoskeletal:     Cervical back: Normal.     Thoracic back: Normal.     Lumbar back: Spasms and tenderness present. Positive right straight leg raise test and positive left straight leg raise test.  Skin:    General: Skin is warm.     Capillary Refill: Capillary refill takes less than 2 seconds.  Neurological:     General: No focal deficit present.     Mental Status: She is alert and oriented to person, place, and  time.  Psychiatric:        Mood and Affect: Mood normal.        Behavior:  Behavior normal.        Thought Content: Thought content normal.        Judgment: Judgment normal.    UC Treatments / Results  Labs (all labs ordered are listed, but only abnormal results are displayed) Labs Reviewed  POCT URINALYSIS DIPSTICK, ED / UC  POC URINE PREG, ED    EKG   Radiology No results found.  Procedures Procedures (including critical care time)  Medications Ordered in UC Medications  methylPREDNISolone sodium succinate (SOLU-MEDROL) 125 mg/2 mL injection 125 mg (125 mg Intramuscular Given 07/05/21 1941)    Initial Impression / Assessment and Plan / UC Course  I have reviewed the triage vital signs and the nursing notes.  Pertinent labs & imaging results that were available during my care of the patient were reviewed by me and considered in my medical decision making (see chart for details).    Lumbar strain, bilaterally with intermittent muscle spasms of lower back Solu-Medrol IM given here in clinic. Patient will start Toradol tomorrow twice daily as needed for inflammation and pain For acute pain tizanidine nightly.  She will continue applications of heat. Patient will follow-up if symptoms worsen or do not readily improve Final Clinical Impressions(s) / UC Diagnoses   Final diagnoses:  Strain of lumbar region, initial encounter  Spasm of muscle of lower back     Discharge Instructions      For anti-inflammatory action take Toradol twice daily as needed for pain. Continue applying heat to help with back pain. Tizanidine as a muscle relaxer taken only at bedtime as it causes severe drowsiness.  You may take a dose during the day as long as she do not have to operate any machinery or drive.     ED Prescriptions     Medication Sig Dispense Auth. Provider   ketorolac (TORADOL) 10 MG tablet Take 1 tablet (10 mg total) by mouth every 12 (twelve) hours as needed. 14 tablet Bing Neighbors, FNP   tiZANidine (ZANAFLEX) 4 MG tablet Take 1 tablet  (4 mg total) by mouth at bedtime. 30 tablet Bing Neighbors, FNP      PDMP not reviewed this encounter.   Bing Neighbors, FNP 07/05/21 2042
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
## 2022-03-06 ENCOUNTER — Ambulatory Visit (HOSPITAL_COMMUNITY)
Admission: EM | Admit: 2022-03-06 | Discharge: 2022-03-06 | Disposition: A | Discharge status: Home / Self Care | Payer: Managed Care, Other (non HMO) | Attending: Family Medicine | Admitting: Family Medicine

## 2022-03-06 ENCOUNTER — Encounter (HOSPITAL_COMMUNITY): Payer: Self-pay | Admitting: Emergency Medicine

## 2022-03-06 DIAGNOSIS — T2000XA Burn of unspecified degree of head, face, and neck, unspecified site, initial encounter: Secondary | ICD-10-CM | POA: Diagnosis not present

## 2022-03-06 DIAGNOSIS — T2601XA Burn of right eyelid and periocular area, initial encounter: Secondary | ICD-10-CM

## 2022-03-06 MED ORDER — ACETAMINOPHEN 325 MG PO TABS
650.0000 mg | ORAL_TABLET | Freq: Once | ORAL | Status: AC
  Administered 2022-03-06: 650 mg via ORAL

## 2022-03-06 MED ORDER — ACETAMINOPHEN 325 MG PO TABS
ORAL_TABLET | ORAL | Status: AC
  Filled 2022-03-06: qty 2

## 2022-03-06 MED ORDER — ACETAMINOPHEN-CODEINE 300-30 MG PO TABS
1.0000 | ORAL_TABLET | Freq: Four times a day (QID) | ORAL | 0 refills | Status: DC | PRN
Start: 2022-03-06 — End: 2022-05-21
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
## 2022-03-06 NOTE — ED Notes (Signed)
Cool compresses provided.
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
## 2022-05-19 ENCOUNTER — Ambulatory Visit (HOSPITAL_COMMUNITY)
Admission: EM | Admit: 2022-05-19 | Discharge: 2022-05-19 | Disposition: A | Discharge status: Home / Self Care | Payer: Managed Care, Other (non HMO) | Attending: Physician Assistant | Admitting: Physician Assistant

## 2022-05-19 ENCOUNTER — Encounter (HOSPITAL_COMMUNITY): Payer: Self-pay | Admitting: *Deleted

## 2022-05-19 ENCOUNTER — Other Ambulatory Visit: Payer: Self-pay

## 2022-05-19 DIAGNOSIS — M62830 Muscle spasm of back: Secondary | ICD-10-CM

## 2022-05-19 MED ORDER — IBUPROFEN 600 MG PO TABS: 600.0000 mg | ORAL_TABLET | Freq: Four times a day (QID) | ORAL | 0 refills | Status: DC | PRN
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
## 2022-05-21 ENCOUNTER — Encounter (HOSPITAL_COMMUNITY): Payer: Self-pay

## 2022-05-21 ENCOUNTER — Ambulatory Visit (HOSPITAL_COMMUNITY)
Admission: EM | Admit: 2022-05-21 | Discharge: 2022-05-21 | Disposition: A | Discharge status: Home / Self Care | Payer: Self-pay | Attending: Family Medicine | Admitting: Family Medicine

## 2022-05-21 ENCOUNTER — Ambulatory Visit (INDEPENDENT_AMBULATORY_CARE_PROVIDER_SITE_OTHER): Payer: Self-pay

## 2022-05-21 DIAGNOSIS — M25562 Pain in left knee: Secondary | ICD-10-CM

## 2022-05-21 MED ORDER — KETOROLAC TROMETHAMINE 30 MG/ML IJ SOLN
INTRAMUSCULAR | Status: AC
  Filled 2022-05-21: qty 1

## 2022-05-21 MED ORDER — IBUPROFEN 800 MG PO TABS: 800.0000 mg | ORAL_TABLET | Freq: Three times a day (TID) | ORAL | 0 refills | Status: AC | PRN

## 2022-05-21 MED ORDER — KETOROLAC TROMETHAMINE 30 MG/ML IJ SOLN
30.0000 mg | Freq: Once | INTRAMUSCULAR | Status: AC
  Administered 2022-05-21: 30 mg via INTRAMUSCULAR
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
## 2022-05-21 NOTE — Discharge Instructions (Addendum)
There are no broken bones on the knee x-ray You have been given a shot of Toradol 30 mg today.  Take ibuprofen 800 mg--1 tab every 8 hours as needed for pain.
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
## 2022-06-04 ENCOUNTER — Encounter (HOSPITAL_COMMUNITY): Payer: Self-pay | Admitting: Emergency Medicine

## 2022-06-04 ENCOUNTER — Ambulatory Visit (HOSPITAL_COMMUNITY)
Admission: EM | Admit: 2022-06-04 | Discharge: 2022-06-04 | Disposition: A | Discharge status: Home / Self Care | Payer: Managed Care, Other (non HMO) | Attending: Physician Assistant | Admitting: Physician Assistant

## 2022-06-04 DIAGNOSIS — B009 Herpesviral infection, unspecified: Secondary | ICD-10-CM | POA: Insufficient documentation

## 2022-06-04 DIAGNOSIS — N939 Abnormal uterine and vaginal bleeding, unspecified: Secondary | ICD-10-CM | POA: Insufficient documentation

## 2022-06-04 DIAGNOSIS — Z3202 Encounter for pregnancy test, result negative: Secondary | ICD-10-CM | POA: Diagnosis not present

## 2022-06-04 DIAGNOSIS — Z8619 Personal history of other infectious and parasitic diseases: Secondary | ICD-10-CM | POA: Diagnosis present

## 2022-06-04 LAB — CBC
HCT: 34.1 % — ABNORMAL LOW (ref 36.0–46.0)
Hemoglobin: 10.6 g/dL — ABNORMAL LOW (ref 12.0–15.0)
MCH: 25.7 pg — ABNORMAL LOW (ref 26.0–34.0)
MCHC: 31.1 g/dL (ref 30.0–36.0)
MCV: 82.6 fL (ref 80.0–100.0)
Platelets: 218 10*3/uL (ref 150–400)
RBC: 4.13 MIL/uL (ref 3.87–5.11)
RDW: 13.2 % (ref 11.5–15.5)
WBC: 2.1 10*3/uL — ABNORMAL LOW (ref 4.0–10.5)
nRBC: 0 % (ref 0.0–0.2)

## 2022-06-04 LAB — COMPREHENSIVE METABOLIC PANEL
ALT: 10 U/L (ref 0–44)
AST: 27 U/L (ref 15–41)
Albumin: 4.2 g/dL (ref 3.5–5.0)
Alkaline Phosphatase: 25 U/L — ABNORMAL LOW (ref 38–126)
Anion gap: 8 (ref 5–15)
BUN: 5 mg/dL — ABNORMAL LOW (ref 6–20)
CO2: 25 mmol/L (ref 22–32)
Calcium: 9.1 mg/dL (ref 8.9–10.3)
Chloride: 105 mmol/L (ref 98–111)
Creatinine, Ser: 0.82 mg/dL (ref 0.44–1.00)
GFR, Estimated: >60 mL/min (ref >60)
Glucose, Bld: 92 mg/dL (ref 70–99)
Potassium: 3.8 mmol/L (ref 3.5–5.1)
Sodium: 138 mmol/L (ref 135–145)
Total Bilirubin: 0.4 mg/dL (ref 0.3–1.2)
Total Protein: 7.1 g/dL (ref 6.5–8.1)

## 2022-06-04 LAB — HCG, QUANTITATIVE, PREGNANCY: hCG, Beta Chain, Quant, S: <1 m[IU]/mL (ref <5)

## 2022-06-04 LAB — TSH: TSH: 2.694 u[IU]/mL (ref 0.350–4.500)

## 2022-06-04 LAB — POC URINE PREG, ED: Preg Test, Ur: NEGATIVE

## 2022-06-04 LAB — T4, FREE: Free T4: 0.79 ng/dL (ref 0.61–1.12)

## 2022-06-04 MED ORDER — MEGESTROL ACETATE 40 MG PO TABS: 40.0000 mg | ORAL_TABLET | Freq: Every day | ORAL | 0 refills | Status: AC

## 2022-06-04 MED ORDER — VALACYCLOVIR HCL 1 G PO TABS: 1000.0000 mg | ORAL_TABLET | Freq: Every day | ORAL | 0 refills | Status: AC | PRN

## 2022-06-04 NOTE — Discharge Instructions (Signed)
Your urine pregnancy test was negative.  We will contact you if any of your other testing is positive.  Sent in for MyChart to monitor for your results.  Start Megace daily.  This will stop your period but you will have bleeding as soon as you stop the medication.  Hopefully this will reset your cycle but if you continue to have symptoms I recommend you follow-up with OB/GYN; call to schedule an appointment with them.  If you develop any worsening symptoms including heavy bleeding, shortness of breath, chest pain, heart racing, fatigue you need to be seen immediately.  I sent a refill of your valacyclovir as requested.

## 2022-06-04 NOTE — ED Triage Notes (Signed)
Pt reports abnormal vaginal bleeding. States her last menstrual cycle was 10/29 and this morning noticed she was having vaginal bleeding similar to a menstrual cycle.

## 2022-06-04 NOTE — ED Provider Notes (Signed)
[facility]    CSN: [id number] Arrival date & time: 06/04/22  1747      History   Chief Complaint Chief Complaint  Patient presents with   Vaginal Bleeding    HPI Julia Bautista is a 41 y.o. female.   Patient presents today with irregular menstrual bleeding.  Reports prior to recent episode she had very irregular periods that generally came every 28 to 30 days and lasted for approximately 5 days.  She had a normal period beginning 05/20/2022.  This morning she woke up and had significant bleeding similar to a normal menstrual cycle.  She has had to change her personal hygiene products every 4-6 hours which is her typical.  She denies any recent medication changes.  She is not taking any birth control.  She denies any significant stressors or recent illness.  Denies history of bleeding disorder.  She does not take blood thinning medication.  Denies any chest pain, shortness of breath, nausea, vomiting, breast tenderness.      Past Medical History:  Diagnosis Date   Abnormal Pap smear    Asthma    PRN inhaler   Herpes    Other acne    ectopic pregnancy.     Patient Active Problem List   Diagnosis Date Noted   history of Herpes 06/11/2016   History of loop electrical excision procedure (LEEP) 06/11/2016   S/P repeat cesarean section 06/11/2016   History of abnormal cervical Pap smear 01/25/2016    Past Surgical History:  Procedure Laterality Date   CESAREAN SECTION  2005   CESAREAN SECTION N/A 01/07/2017   Procedure: CESAREAN SECTION;  Surgeon: Willodean Rosenthal, MD;  Location: [facility]TH[facility] SU[facility];  Service: Obstetrics;  Laterality: N/A;   COLPOSCOPY  2013   FINGER SURGERY     LEEP  2013   ROTATOR CUFF REPAIR     TOOTH EXTRACTION     UNILATERAL SALPINGECTOMY Right 2008   via mini-lap    OB History     Gravida  7   Para  2   Term  2   Preterm      AB  5   Living  2      SAB  2   IAB  2   Ectopic  1   Multiple  0   Live Births   2        Obstetric Comments  EAB x 3 (all D&Cs), 2015 pLTCS @ 6cm per patient, SAB x 2          Home Medications    Prior to Admission medications   Medication Sig Start Date End Date Taking? Authorizing Provider  megestrol (MEGACE) 40 MG tablet Take 1 tablet (40 mg total) by mouth daily. 06/04/22  Yes Brynn Reznik, Noberto Retort, PA-C  acetaminophen (TYLENOL) 500 MG tablet Take 1,000 mg by mouth every 6 (six) hours as needed for mild pain.     [provider]  albuterol (PROVENTIL HFA;VENTOLIN HFA) 108 (90 Base) MCG/ACT inhaler Inhale 2 puffs into the lungs every 6 (six) hours as needed for wheezing or shortness of breath. Reported on 01/25/2016    [provider]  cetirizine (ZYRTEC ALLERGY) 10 MG tablet Take 1 tablet (10 mg total) by mouth daily. Patient taking differently: Take 10 mg by mouth daily as needed for allergies. 12/18/20   Ivette Loyal, NP  ibuprofen (ADVIL) 800 MG tablet Take 1 tablet (800 mg total) by mouth every 8 (eight) hours as needed (  pain). 05/21/22   Zenia Resides, MD  OVER THE COUNTER MEDICATION Take 1 capsule by mouth daily. Patient takes the herbal supplement Sea Moss.    [provider]  tiZANidine (ZANAFLEX) 4 MG tablet Take 1 tablet (4 mg total) by mouth at bedtime. 07/05/21   Bing Neighbors, FNP  triamcinolone cream (KENALOG) 0.1 % Apply 1 application topically 2 (two) times daily. Patient taking differently: Apply 1 application topically 2 (two) times daily as needed (dry skin). 12/18/20   Ivette Loyal, NP  valACYclovir (VALTREX) 1000 MG tablet Take 1 tablet (1,000 mg total) by mouth daily as needed (For outbreak.). 06/04/22   Jezel Basto, Noberto Retort, PA-C    Family History Family History  Problem Relation Age of Onset   Diabetes Mother    Hypertension Sister     Social History Social History   Tobacco Use   Smoking status: Former    Packs/day: 0.00    Years: 12.00    Total pack years: 0.00    Types: E-cigarettes, Cigarettes     Quit date: 06/04/2016    Years since quitting: 6.0   Smokeless tobacco: Never   Tobacco comments:    1-800- ouit # given  Substance Use Topics   Alcohol use: No    Comment: socially before pregancy   Drug use: No     Allergies   Darvon [propoxyphene], Percocet [oxycodone-acetaminophen], and Sulfa drugs cross reactors   Review of Systems Review of Systems  Constitutional:  Positive for activity change. Negative for appetite change, fatigue and fever.  Respiratory:  Negative for cough and shortness of breath.   Cardiovascular:  Negative for chest pain.  Gastrointestinal:  Negative for abdominal pain, diarrhea, nausea and vomiting.  Genitourinary:  Positive for menstrual problem and vaginal bleeding. Negative for dysuria, frequency, pelvic pain, urgency, vaginal discharge and vaginal pain.     Physical Exam Triage Vital Signs ED Triage Vitals  Enc Vitals Group     BP 06/04/22 1843 132/77     Pulse Rate 06/04/22 1843 70     Resp 06/04/22 1843 17     Temp 06/04/22 1843 98.2 F (36.8 C)     Temp Source 06/04/22 1843 Oral     SpO2 06/04/22 1843 100 %     Weight --      Height --      Head Circumference --      Peak Flow --      Pain Score 06/04/22 1842 0     Pain Loc --      Pain Edu? --      Excl. in [place]? --    No data found.  Updated Vital Signs BP 132/77 (BP Location: Right Arm)   Pulse 70   Temp 98.2 F (36.8 C) (Oral)   Resp 17   LMP 05/20/2022 (Exact Date)   SpO2 100%   Visual Acuity Right Eye Distance:   Left Eye Distance:   Bilateral Distance:    Right Eye Near:   Left Eye Near:    Bilateral Near:     Physical Exam Vitals reviewed.  Constitutional:      General: She is awake. She is not in acute distress.    Appearance: Normal appearance. She is well-developed. She is not ill-appearing.     Comments: Very pleasant female appears stated age in no acute distress sitting comfortably in exam room  HENT:     Head: Normocephalic and atraumatic.   Cardiovascular:  Rate and Rhythm: Normal rate and regular rhythm.     Heart sounds: Normal heart sounds, S1 normal and S2 normal. No murmur heard. Pulmonary:     Effort: Pulmonary effort is normal.     Breath sounds: Normal breath sounds. No wheezing, rhonchi or rales.     Comments: Clear to auscultation bilaterally Abdominal:     General: Bowel sounds are normal.     Palpations: Abdomen is soft.     Tenderness: There is no abdominal tenderness. There is no right CVA tenderness, left CVA tenderness, guarding or rebound.     Comments: Benign abdominal exam  Psychiatric:        Behavior: Behavior is cooperative.      UC Treatments / Results  Labs (all labs ordered are listed, but only abnormal results are displayed) Labs Reviewed  CBC  COMPREHENSIVE METABOLIC PANEL  TSH  T4, FREE  HCG, QUANTITATIVE, PREGNANCY  POC URINE PREG, ED    EKG   Radiology No results found.  Procedures Procedures (including critical care time)  Medications Ordered in UC Medications - No data to display  Initial Impression / Assessment and Plan / UC Course  I have reviewed the triage vital signs and the nursing notes.  Pertinent labs & imaging results that were available during my care of the patient were reviewed by me and considered in my medical decision making (see chart for details).     Patient is well-appearing, afebrile, nontoxic, nontachycardic.  Urine pregnancy test was negative in clinic today.  Will obtain CBC, CMP, thyroid, serum hCG to investigate potential causes and ensure she is not anemic.  She was started on Megace daily.  Discussed that she will have withdrawal bleeding as soon as this medication course has been completed but hopefully this will reset her cycle.  Recommended that she follow-up with OB/GYN and was given contact information for local provider with instruction to call to schedule an appointment.  Discussed that if she has persistent heavy bleeding, shortness  of breath, chest pain, palpitations, fatigue she should be seen immediately.  Strict return precautions given.  At the end of visit patient requested a refill of her acyclovir.  Reports she takes this as needed with outbreaks of herpes simplex.  She is not currently having any symptoms would like to have this available in case symptoms recur.  She denies any side effects with this medication or other concerns.  Refill was sent per her request.  Final Clinical Impressions(s) / UC Diagnoses   Final diagnoses:  Abnormal uterine bleeding (AUB)  History of herpes simplex infection     Discharge Instructions      Your urine pregnancy test was negative.  We will contact you if any of your other testing is positive.  Sent in for MyChart to monitor for your results.  Start Megace daily.  This will stop your period but you will have bleeding as soon as you stop the medication.  Hopefully this will reset your cycle but if you continue to have symptoms I recommend you follow-up with OB/GYN; call to schedule an appointment with them.  If you develop any worsening symptoms including heavy bleeding, shortness of breath, chest pain, heart racing, fatigue you need to be seen immediately.  I sent a refill of your valacyclovir as requested.     ED Prescriptions     Medication Sig Dispense Auth. Provider   valACYclovir (VALTREX) 1000 MG tablet Take 1 tablet (1,000 mg total) by mouth daily as  needed (For outbreak.). 30 tablet Razan Siler K, PA-C   megestrol (MEGACE) 40 MG tablet Take 1 tablet (40 mg total) by mouth daily. 14 tablet Abelardo Seidner, Noberto Retort, PA-C      PDMP not reviewed this encounter.   Jeani Hawking, PA-C 06/04/22 1948

## 2023-02-24 ENCOUNTER — Other Ambulatory Visit: Payer: Self-pay

## 2023-02-24 ENCOUNTER — Encounter (HOSPITAL_COMMUNITY): Payer: Self-pay | Admitting: Emergency Medicine

## 2023-02-24 ENCOUNTER — Ambulatory Visit (HOSPITAL_COMMUNITY)
Admission: EM | Admit: 2023-02-24 | Discharge: 2023-02-24 | Disposition: A | Discharge status: Home / Self Care | Payer: Managed Care, Other (non HMO) | Attending: Internal Medicine | Admitting: Internal Medicine

## 2023-02-24 DIAGNOSIS — R300 Dysuria: Secondary | ICD-10-CM | POA: Diagnosis not present

## 2023-02-24 DIAGNOSIS — N898 Other specified noninflammatory disorders of vagina: Secondary | ICD-10-CM | POA: Insufficient documentation

## 2023-02-24 DIAGNOSIS — Z3202 Encounter for pregnancy test, result negative: Secondary | ICD-10-CM | POA: Insufficient documentation

## 2023-02-24 LAB — POCT URINALYSIS DIP (MANUAL ENTRY)
Bilirubin, UA: NEGATIVE
Blood, UA: NEGATIVE
Glucose, UA: NEGATIVE mg/dL
Leukocytes, UA: NEGATIVE
Nitrite, UA: NEGATIVE
Protein Ur, POC: NEGATIVE mg/dL
Spec Grav, UA: 1.025 (ref 1.010–1.025)
Urobilinogen, UA: 1 E.U./dL
pH, UA: 6 (ref 5.0–8.0)

## 2023-02-24 LAB — POCT URINE PREGNANCY: Preg Test, Ur: NEGATIVE

## 2023-02-24 NOTE — ED Triage Notes (Signed)
Pt states she is having burning sensation with urination and is requesting to be check for STD.

## 2023-02-24 NOTE — ED Provider Notes (Signed)
[facility]    CSN: [id number] Arrival date & time: 02/24/23  1627      History   Chief Complaint Chief Complaint  Patient presents with   Exposure to STD    HPI Julia Bautista is a 42 y.o. female.   Patient presents to urgent care for evaluation of lower abdominal suprapubic cramping and dysuria that started 3-4 days ago. Denies urinary frequency, urgency, and hesitancy. No gross hematuria, N/V, new low back pain, dizziness, fever/chills, flank pain, or recent antibiotic/steroid use. She does not take an SGLT-2 inhibitor. Denies frequent intake of urinary irritants. Drinks 1 8 ounce cup of coffee per day, otherwise drinks water. Reports vaginal discharge has been "thicker than normal" without odor, color changes, or itch. No vaginal rash/irritation. Shew as able to run a urinalysis at her place of work and states this showed "mild signs of UTI". Urine culture performed at her work place was negative and did not show any bacterial growth. She is in a committed relationship with female partner without known exposure to STD or recent new sexual partner. She would like to be checked for STD today just to be sure. Has not attempted treatment of symptoms PTA.    Exposure to STD    Past Medical History:  Diagnosis Date   Abnormal Pap smear    Asthma    PRN inhaler   Herpes    Other acne    ectopic pregnancy.     Patient Active Problem List   Diagnosis Date Noted   history of Herpes 06/11/2016   History of loop electrical excision procedure (LEEP) 06/11/2016   S/P repeat cesarean section 06/11/2016   History of abnormal cervical Pap smear 01/25/2016    Past Surgical History:  Procedure Laterality Date   CESAREAN SECTION  2005   CESAREAN SECTION N/A 01/07/2017   Procedure: CESAREAN SECTION;  Surgeon: Willodean Rosenthal, MD;  Location: [facility];  Service: Obstetrics;  Laterality: N/A;   COLPOSCOPY  2013   FINGER SURGERY     LEEP  2013   ROTATOR CUFF  REPAIR     TOOTH EXTRACTION     UNILATERAL SALPINGECTOMY Right 2008   via mini-lap    OB History     Gravida  7   Para  2   Term  2   Preterm      AB  5   Living  2      SAB  2   IAB  2   Ectopic  1   Multiple  0   Live Births  2        Obstetric Comments  EAB x 3 (all D&Cs), 2015 pLTCS @ 6cm per patient, SAB x 2          Home Medications    Prior to Admission medications   Medication Sig Start Date End Date Taking? Authorizing Provider  acetaminophen (TYLENOL) 500 MG tablet Take 1,000 mg by mouth every 6 (six) hours as needed for mild pain.     [provider]  albuterol (PROVENTIL HFA;VENTOLIN HFA) 108 (90 Base) MCG/ACT inhaler Inhale 2 puffs into the lungs every 6 (six) hours as needed for wheezing or shortness of breath. Reported on 01/25/2016    [provider]  cetirizine (ZYRTEC ALLERGY) 10 MG tablet Take 1 tablet (10 mg total) by mouth daily. Patient taking differently: Take 10 mg by mouth daily as needed for allergies. 12/18/20   Ivette Loyal, NP  ibuprofen (  ADVIL) 800 MG tablet Take 1 tablet (800 mg total) by mouth every 8 (eight) hours as needed (pain). 05/21/22   Zenia Resides, MD  megestrol (MEGACE) 40 MG tablet Take 1 tablet (40 mg total) by mouth daily. 06/04/22   Raspet, Erin K, PA-C  OVER THE COUNTER MEDICATION Take 1 capsule by mouth daily. Patient takes the herbal supplement Sea Moss.    [provider]  tiZANidine (ZANAFLEX) 4 MG tablet Take 1 tablet (4 mg total) by mouth at bedtime. 07/05/21   Bing Neighbors, NP  triamcinolone cream (KENALOG) 0.1 % Apply 1 application topically 2 (two) times daily. Patient taking differently: Apply 1 application topically 2 (two) times daily as needed (dry skin). 12/18/20   Ivette Loyal, NP  valACYclovir (VALTREX) 1000 MG tablet Take 1 tablet (1,000 mg total) by mouth daily as needed (For outbreak.). 06/04/22   Raspet, Noberto Retort, PA-C    Family History Family History   Problem Relation Age of Onset   Diabetes Mother    Hypertension Sister     Social History Social History   Tobacco Use   Smoking status: Former    Current packs/day: 0.00    Types: E-cigarettes, Cigarettes    Quit date: 06/04/2004    Years since quitting: 18.7   Smokeless tobacco: Never   Tobacco comments:    1-800- ouit # given  Substance Use Topics   Alcohol use: No    Comment: socially before pregancy   Drug use: No     Allergies   Darvon [propoxyphene], Percocet [oxycodone-acetaminophen], and Sulfa drugs cross reactors   Review of Systems Review of Systems Per HPI  Physical Exam Triage Vital Signs ED Triage Vitals [02/24/23 1739]  Encounter Vitals Group     BP 137/85     Systolic BP Percentile      Diastolic BP Percentile      Pulse Rate 75     Resp 18     Temp 98.4 F (36.9 C)     Temp Source Oral     SpO2 100 %     Weight 180 lb 12.4 oz (82 kg)     Height 5\' 3"  (1.6 m)     Head Circumference      Peak Flow      Pain Score 0     Pain Loc      Pain Education      Exclude from Growth Chart    No data found.  Updated Vital Signs BP 137/85 (BP Location: Right Arm)   Pulse 75   Temp 98.4 F (36.9 C) (Oral)   Resp 18   Ht 5\' 3"  (1.6 m)   Wt 180 lb 12.4 oz (82 kg)   SpO2 100%   BMI 32.02 kg/m   Visual Acuity Right Eye Distance:   Left Eye Distance:   Bilateral Distance:    Right Eye Near:   Left Eye Near:    Bilateral Near:     Physical Exam Vitals and nursing note reviewed.  Constitutional:      Appearance: She is not ill-appearing or toxic-appearing.  HENT:     Head: Normocephalic and atraumatic.     Right Ear: Hearing and external ear normal.     Left Ear: Hearing and external ear normal.     Nose: Nose normal.     Mouth/Throat:     Lips: Pink.  Eyes:     General: Lids are normal. Vision grossly intact. Gaze aligned  appropriately.     Extraocular Movements: Extraocular movements intact.     Conjunctiva/sclera: Conjunctivae  normal.  Pulmonary:     Effort: Pulmonary effort is normal.  Abdominal:     General: Abdomen is flat. Bowel sounds are normal.     Palpations: Abdomen is soft.     Tenderness: There is no abdominal tenderness. There is no right CVA tenderness, left CVA tenderness or guarding.  Musculoskeletal:     Cervical back: Neck supple.  Skin:    General: Skin is warm and dry.     Capillary Refill: Capillary refill takes less than 2 seconds.     Findings: No rash.  Neurological:     General: No focal deficit present.     Mental Status: She is alert and oriented to person, place, and time. Mental status is at baseline.     Cranial Nerves: No dysarthria or facial asymmetry.  Psychiatric:        Mood and Affect: Mood normal.        Speech: Speech normal.        Behavior: Behavior normal.        Thought Content: Thought content normal.        Judgment: Judgment normal.      UC Treatments / Results  Labs (all labs ordered are listed, but only abnormal results are displayed)  EKG   Radiology No results found.  Procedures Procedures (including critical care time)  Medications Ordered in UC Medications - No data to display  Initial Impression / Assessment and Plan / UC Course  I have reviewed the triage vital signs and the nursing notes.  Pertinent labs & imaging results that were available during my care of the patient were reviewed by me and considered in my medical decision making (see chart for details).   1. Vaginal discharge, dysuria, negative UPT Urinalysis unremarkable for signs of UTI, therefore deferred urine culture. Unclear etiology of symptoms, however suspect possible vaginal irritation etiology causing mild dysuria.  STI labs pending, will notify patient of positive results and treat accordingly per protocol when labs result. Advised to continue to push fluids to stay well hydrated and continue to avoid urinary irritants.   Counseled patient on potential for adverse  effects with medications prescribed/recommended today, strict ER and return-to-clinic precautions discussed, patient verbalized understanding.    Final Clinical Impressions(s) / UC Diagnoses   Final diagnoses:  Vaginal discharge  Dysuria  Urine pregnancy test negative     Discharge Instructions      STD testing pending, this will take 2-3 days to result. We will only call you if your testing is positive for any infection(s) and we will provide treatment.  Avoid sexual intercourse until your STD results come back.  If any of your STD results are positive, you will need to avoid sexual intercourse for 7 days while you are being treated to prevent spread of STD.  Condom use is the best way to prevent spread of STDs.  Return to urgent care as needed.       ED Prescriptions   None    PDMP not reviewed this encounter.   Carlisle Beers, [place] 02/25/23 2131

## 2023-02-24 NOTE — Discharge Instructions (Signed)
STD testing pending, this will take 2-3 days to result. We will only call you if your testing is positive for any infection(s) and we will provide treatment.  Avoid sexual intercourse until your STD results come back.  If any of your STD results are positive, you will need to avoid sexual intercourse for 7 days while you are being treated to prevent spread of STD.  Condom use is the best way to prevent spread of STDs.  Return to urgent care as needed.

## 2023-02-27 ENCOUNTER — Telehealth: Payer: Self-pay | Admitting: Emergency Medicine

## 2023-02-27 MED ORDER — FLUCONAZOLE 150 MG PO TABS: 150.0000 mg | ORAL_TABLET | Freq: Once | ORAL | 0 refills | Status: AC

## 2023-02-27 MED ORDER — DOXYCYCLINE HYCLATE 100 MG PO CAPS: 100.0000 mg | ORAL_CAPSULE | Freq: Two times a day (BID) | ORAL | 0 refills | Status: AC

## 2023-02-27 MED ORDER — METRONIDAZOLE 0.75 % VA GEL: 1.0000 | Freq: Every day | VAGINAL | 0 refills | Status: AC

## 2023-03-10 ENCOUNTER — Encounter (HOSPITAL_COMMUNITY): Payer: Self-pay

## 2023-03-10 ENCOUNTER — Ambulatory Visit (HOSPITAL_COMMUNITY)
Admission: EM | Admit: 2023-03-10 | Discharge: 2023-03-10 | Disposition: A | Discharge status: Home / Self Care | Payer: Managed Care, Other (non HMO) | Attending: Emergency Medicine | Admitting: Emergency Medicine

## 2023-03-10 DIAGNOSIS — N760 Acute vaginitis: Secondary | ICD-10-CM | POA: Insufficient documentation

## 2023-03-10 DIAGNOSIS — B9689 Other specified bacterial agents as the cause of diseases classified elsewhere: Secondary | ICD-10-CM | POA: Diagnosis present

## 2023-03-10 DIAGNOSIS — R300 Dysuria: Secondary | ICD-10-CM | POA: Insufficient documentation

## 2023-03-10 LAB — POCT URINALYSIS DIP (MANUAL ENTRY)
Bilirubin, UA: NEGATIVE
Glucose, UA: NEGATIVE mg/dL
Ketones, POC UA: NEGATIVE mg/dL
Leukocytes, UA: NEGATIVE
Nitrite, UA: NEGATIVE
Spec Grav, UA: 1.025 (ref 1.010–1.025)
Urobilinogen, UA: 0.2 E.U./dL
pH, UA: 7.5 (ref 5.0–8.0)

## 2023-03-10 LAB — POCT URINE PREGNANCY: Preg Test, Ur: NEGATIVE

## 2023-03-10 NOTE — ED Triage Notes (Addendum)
Pt was seen and treated for Chlamydia on 02/27/2023. Pt also treated positive for BV and Yeast. Pt states she completed her medication but burning and discomfort is still there.

## 2023-03-10 NOTE — ED Provider Notes (Signed)
[facility]    CSN: [id number] Arrival date & time: 03/10/23  1635      History   Chief Complaint Chief Complaint  Patient presents with   Dysuria   Vaginal Discharge    HPI Julia Bautista is a 42 y.o. female.   Patient presents to clinic for complaints of lower abdominal discomfort and dysuria that have continued since her last visit.  Reports compliance with doxycycline and fluconazole.  She has not yet completed her MetroGel, which she was supposed to be doing nightly for 5 nights.   She has not been sexually active since her last visit on 02/24/23.   She denies any fevers, nausea, vomiting or diarrhea.  The history is provided by the patient and medical records.  Dysuria Associated symptoms: abdominal pain and vaginal discharge   Associated symptoms: no flank pain, no nausea and no vomiting   Vaginal Discharge Associated symptoms: abdominal pain and dysuria   Associated symptoms: no nausea and no vomiting     Past Medical History:  Diagnosis Date   Abnormal Pap smear    Asthma    PRN inhaler   Herpes    Other acne    ectopic pregnancy.     Patient Active Problem List   Diagnosis Date Noted   history of Herpes 06/11/2016   History of loop electrical excision procedure (LEEP) 06/11/2016   S/P repeat cesarean section 06/11/2016   History of abnormal cervical Pap smear 01/25/2016    Past Surgical History:  Procedure Laterality Date   CESAREAN SECTION  2005   CESAREAN SECTION N/A 01/07/2017   Procedure: CESAREAN SECTION;  Surgeon: Willodean Rosenthal, MD;  Location: [facility];  Service: Obstetrics;  Laterality: N/A;   COLPOSCOPY  2013   FINGER SURGERY     LEEP  2013   ROTATOR CUFF REPAIR     TOOTH EXTRACTION     UNILATERAL SALPINGECTOMY Right 2008   via mini-lap    OB History     Gravida  7   Para  2   Term  2   Preterm      AB  5   Living  2      SAB  2   IAB  2   Ectopic  1   Multiple  0   Live Births   2        Obstetric Comments  EAB x 3 (all D&Cs), 2015 pLTCS @ 6cm per patient, SAB x 2          Home Medications    Prior to Admission medications   Medication Sig Start Date End Date Taking? Authorizing Provider  acetaminophen (TYLENOL) 500 MG tablet Take 1,000 mg by mouth every 6 (six) hours as needed for mild pain.    Yes [provider]  albuterol (PROVENTIL HFA;VENTOLIN HFA) 108 (90 Base) MCG/ACT inhaler Inhale 2 puffs into the lungs every 6 (six) hours as needed for wheezing or shortness of breath. Reported on 01/25/2016   Yes [provider]  cetirizine (ZYRTEC ALLERGY) 10 MG tablet Take 1 tablet (10 mg total) by mouth daily. Patient taking differently: Take 10 mg by mouth daily as needed for allergies. 12/18/20  Yes Ivette Loyal, NP  ibuprofen (ADVIL) 800 MG tablet Take 1 tablet (800 mg total) by mouth every 8 (eight) hours as needed (pain). 05/21/22  Yes Zenia Resides, MD  megestrol (MEGACE) 40 MG tablet Take 1 tablet (40 mg total) by mouth daily.  06/04/22  Yes Raspet, Erin K, PA-C  OVER THE COUNTER MEDICATION Take 1 capsule by mouth daily. Patient takes the herbal supplement Sea Moss.   Yes [provider]  tiZANidine (ZANAFLEX) 4 MG tablet Take 1 tablet (4 mg total) by mouth at bedtime. 07/05/21  Yes Bing Neighbors, NP  triamcinolone cream (KENALOG) 0.1 % Apply 1 application topically 2 (two) times daily. Patient taking differently: Apply 1 application  topically 2 (two) times daily as needed (dry skin). 12/18/20  Yes Ivette Loyal, NP  valACYclovir (VALTREX) 1000 MG tablet Take 1 tablet (1,000 mg total) by mouth daily as needed (For outbreak.). 06/04/22  Yes Raspet, Noberto Retort, PA-C    Family History Family History  Problem Relation Age of Onset   Diabetes Mother    Hypertension Sister     Social History Social History   Tobacco Use   Smoking status: Former    Current packs/day: 0.00    Types: E-cigarettes, Cigarettes    Quit  date: 06/04/2004    Years since quitting: 18.7   Smokeless tobacco: Never   Tobacco comments:    1-800- ouit # given  Substance Use Topics   Alcohol use: No    Comment: socially before pregancy   Drug use: No     Allergies   Darvon [propoxyphene], Percocet [oxycodone-acetaminophen], and Sulfa drugs cross reactors   Review of Systems Review of Systems  Gastrointestinal:  Positive for abdominal pain. Negative for nausea and vomiting.  Genitourinary:  Positive for dysuria and vaginal discharge. Negative for flank pain, genital sores, hematuria, menstrual problem and urgency.     Physical Exam Triage Vital Signs ED Triage Vitals [03/10/23 1659]  Encounter Vitals Group     BP (!) 144/84     Systolic BP Percentile      Diastolic BP Percentile      Pulse Rate 79     Resp 16     Temp 98.3 F (36.8 C)     Temp Source Oral     SpO2 99 %     Weight      Height      Head Circumference      Peak Flow      Pain Score      Pain Loc      Pain Education      Exclude from Growth Chart    No data found.  Updated Vital Signs BP (!) 144/84 (BP Location: Left Arm)   Pulse 79   Temp 98.3 F (36.8 C) (Oral)   Resp 16   LMP 03/01/2023 (Approximate)   SpO2 99%   Visual Acuity Right Eye Distance:   Left Eye Distance:   Bilateral Distance:    Right Eye Near:   Left Eye Near:    Bilateral Near:     Physical Exam Vitals and nursing note reviewed.  Constitutional:      Appearance: Normal appearance.  HENT:     Head: Normocephalic and atraumatic.     Right Ear: External ear normal.     Left Ear: External ear normal.     Nose: Nose normal.     Mouth/Throat:     Mouth: Mucous membranes are moist.  Eyes:     Conjunctiva/sclera: Conjunctivae normal.  Cardiovascular:     Rate and Rhythm: Normal rate.  Pulmonary:     Effort: Pulmonary effort is normal. No respiratory distress.  Abdominal:     Tenderness: There is no right CVA tenderness or left  CVA tenderness.   Musculoskeletal:        General: Normal range of motion.  Skin:    General: Skin is warm and dry.  Neurological:     General: No focal deficit present.     Mental Status: She is alert and oriented to person, place, and time.  Psychiatric:        Mood and Affect: Mood normal.        Behavior: Behavior normal.      UC Treatments / Results  Labs (all labs ordered are listed, but only abnormal results are displayed) Labs Reviewed  URINE CULTURE  POCT URINALYSIS DIP (MANUAL ENTRY)  POCT URINE PREGNANCY  CERVICOVAGINAL ANCILLARY ONLY    EKG   Radiology No results found.  Procedures Procedures (including critical care time)  Medications Ordered in UC Medications - No data to display  Initial Impression / Assessment and Plan / UC Course  I have reviewed the triage vital signs and the nursing notes.  Pertinent labs & imaging results that were available during my care of the patient were reviewed by me and considered in my medical decision making (see chart for details).  Vitals and triage reviewed, patient is hemodynamically stable.  Negative for CVA tenderness, without concern for pyelonephritis.  Will repeat cytology swab due to continued symptoms, suspect BV remains since patient had not yet completed her treatment.  Urinalysis shows trace red blood cells, will send for culture due to dysuria.  Advised to complete MetroGel for BV.  Plan of care, follow-up care and return precautions given, no questions at this time.    Final Clinical Impressions(s) / UC Diagnoses   Final diagnoses:  BV (bacterial vaginosis)  Acute vaginitis  Dysuria     Discharge Instructions      Please ensure complement of the MetroGel for treatment of bacterial vaginosis, as I believe this is why you are still symptomatic.  Your urine did not show any obvious signs of infection, we are sending it off for culture and we will contact you if antibiotics are needed.  To help flush out your kidneys  ensure you are drinking at least 64 ounces of water daily.  We will contact you if any additional treatment is needed.   Return to clinic for any new or urgent symptoms.      ED Prescriptions   None    PDMP not reviewed this encounter.   Lurdes Haltiwanger, [place], [place] 03/10/23 [phone]

## 2023-03-10 NOTE — Discharge Instructions (Addendum)
Please ensure complement of the MetroGel for treatment of bacterial vaginosis, as I believe this is why you are still symptomatic.  Your urine did not show any obvious signs of infection, we are sending it off for culture and we will contact you if antibiotics are needed.  To help flush out your kidneys ensure you are drinking at least 64 ounces of water daily.  We will contact you if any additional treatment is needed.   Return to clinic for any new or urgent symptoms.

## 2023-03-11 LAB — CERVICOVAGINAL ANCILLARY ONLY
Bacterial Vaginitis (gardnerella): POSITIVE — AB
Candida Glabrata: NEGATIVE
Candida Vaginitis: NEGATIVE
Chlamydia: NEGATIVE
Comment: NEGATIVE
Comment: NEGATIVE
Comment: NEGATIVE
Comment: NEGATIVE
Comment: NEGATIVE
Comment: NORMAL
Neisseria Gonorrhea: NEGATIVE
Trichomonas: NEGATIVE

## 2023-03-11 LAB — URINE CULTURE: Culture: NO GROWTH

## 2024-07-19 ENCOUNTER — Ambulatory Visit
Admission: EM | Admit: 2024-07-19 | Discharge: 2024-07-19 | Disposition: A | Discharge status: Home / Self Care | Attending: Family Medicine | Admitting: Family Medicine

## 2024-07-19 DIAGNOSIS — R6889 Other general symptoms and signs: Secondary | ICD-10-CM | POA: Diagnosis not present

## 2024-07-19 DIAGNOSIS — Z20828 Contact with and (suspected) exposure to other viral communicable diseases: Secondary | ICD-10-CM

## 2024-07-19 DIAGNOSIS — J4520 Mild intermittent asthma, uncomplicated: Secondary | ICD-10-CM

## 2024-07-19 MED ORDER — ALBUTEROL SULFATE HFA 108 (90 BASE) MCG/ACT IN AERS: 1.0000 | INHALATION_SPRAY | Freq: Four times a day (QID) | RESPIRATORY_TRACT | 0 refills | Status: AC | PRN

## 2024-07-19 MED ORDER — PREDNISONE 20 MG PO TABS: 40.0000 mg | ORAL_TABLET | Freq: Every day | ORAL | 0 refills | Status: AC

## 2024-07-19 MED ORDER — PROMETHAZINE-DM 6.25-15 MG/5ML PO SYRP: 5.0000 mL | ORAL_SOLUTION | Freq: Four times a day (QID) | ORAL | 0 refills | Status: AC | PRN

## 2024-07-19 MED ORDER — OSELTAMIVIR PHOSPHATE 75 MG PO CAPS: 75.0000 mg | ORAL_CAPSULE | Freq: Two times a day (BID) | ORAL | 0 refills | Status: AC

## 2024-07-19 NOTE — ED Provider Notes (Signed)
 " [facility]    CSN: [id number] Arrival date & time: 07/19/24  1258      History   Chief Complaint Chief Complaint  Patient presents with   Headache    HPI MARQUETTE PIONTEK is a 43 y.o. female  presents for evaluation of URI symptoms for 1 days. Patient reports associated symptoms of headache, sneezing that started today. Denies N/V/D, cough, congestion, sore throat, ear pain, body aches, shortness of breath. Patient does have a hx of asthma.  Albuterol  inhaler but has not needed to use since symptom onset.  Patient is not an active smoker.   Reports  sick contacts via children.  Pt has taken nothing OTC for symptoms. Pt has no other concerns at this time.    Headache   Past Medical History:  Diagnosis Date   Abnormal Pap smear    Asthma    PRN inhaler   Herpes    Other acne    ectopic pregnancy.     Patient Active Problem List   Diagnosis Date Noted   history of Herpes 06/11/2016   History of loop electrical excision procedure (LEEP) 06/11/2016   S/P repeat cesarean section 06/11/2016   History of abnormal cervical Pap smear 01/25/2016    Past Surgical History:  Procedure Laterality Date   CESAREAN SECTION  2005   CESAREAN SECTION N/A 01/07/2017   Procedure: CESAREAN SECTION;  Surgeon: Corene Coy, MD;  Location: [facility]IR[facility]ING SU[facility];  Service: Obstetrics;  Laterality: N/A;   COLPOSCOPY  2013   FINGER SURGERY     LEEP  2013   ROTATOR CUFF REPAIR     TOOTH EXTRACTION     UNILATERAL SALPINGECTOMY Right 2008   via mini-lap    OB History     Gravida  7   Para  2   Term  2   Preterm      AB  5   Living  2      SAB  2   IAB  2   Ectopic  1   Multiple  0   Live Births  2        Obstetric Comments  EAB x 3 (all D&Cs), 2015 pLTCS @ 6cm per patient, SAB x 2          Home Medications    Prior to Admission medications  Medication Sig Start Date End Date Taking? Authorizing Provider  albuterol  (VENTOLIN  HFA) 108 (90  Base) MCG/ACT inhaler Inhale 1-2 puffs into the lungs every 6 (six) hours as needed. 07/19/24  Yes Gael Londo, Jodi R, NP  oseltamivir  (TAMIFLU ) 75 MG capsule Take 1 capsule (75 mg total) by mouth every 12 (twelve) hours. 07/19/24  Yes Raeden Belzer, Jodi R, NP  predniSONE  (DELTASONE ) 20 MG tablet Take 2 tablets (40 mg total) by mouth daily with breakfast for 5 days. 07/19/24 07/24/24 Yes Rosibel Giacobbe, Jodi R, NP  promethazine -dextromethorphan (PROMETHAZINE -DM) 6.25-15 MG/5ML syrup Take 5 mLs by mouth 4 (four) times daily as needed for cough. 07/19/24  Yes Jaqua Ching, Jodi R, NP  acetaminophen  (TYLENOL ) 500 MG tablet Take 1,000 mg by mouth every 6 (six) hours as needed for mild pain.     [provider]  cetirizine  (ZYRTEC  ALLERGY) 10 MG tablet Take 1 tablet (10 mg total) by mouth daily. Patient taking differently: Take 10 mg by mouth daily as needed for allergies. 12/18/20   Claudene Ashley SAUNDERS, NP  ibuprofen  (ADVIL ) 800 MG tablet Take 1 tablet (800 mg total) by mouth every  8 (eight) hours as needed (pain). 05/21/22   Vonna Sharlet POUR, MD  megestrol  (MEGACE ) 40 MG tablet Take 1 tablet (40 mg total) by mouth daily. 06/04/22   Raspet, Erin K, PA-C  OVER THE COUNTER MEDICATION Take 1 capsule by mouth daily. Patient takes the herbal supplement [place].    [provider]  tiZANidine  (ZANAFLEX ) 4 MG tablet Take 1 tablet (4 mg total) by mouth at bedtime. 07/05/21   Arloa Suzen RAMAN, NP  triamcinolone  cream (KENALOG ) 0.1 % Apply 1 application topically 2 (two) times daily. Patient taking differently: Apply 1 application  topically 2 (two) times daily as needed (dry skin). 12/18/20   Claudene Ashley SAUNDERS, NP  valACYclovir  (VALTREX ) 1000 MG tablet Take 1 tablet (1,000 mg total) by mouth daily as needed (For outbreak.). 06/04/22   Raspet, Rocky POUR, PA-C    Family History Family History  Problem Relation Age of Onset   Diabetes Mother    Hypertension Sister     Social History Social History[1]   Allergies   Darvon  [propoxyphene], Percocet [oxycodone-acetaminophen ], and Sulfa drugs cross reactors   Review of Systems Review of Systems  HENT:  Positive for sneezing.   Neurological:  Positive for headaches.     Physical Exam Triage Vital Signs ED Triage Vitals  Encounter Vitals Group     BP 07/19/24 1406 135/75     Girls Systolic BP Percentile --      Girls Diastolic BP Percentile --      Boys Systolic BP Percentile --      Boys Diastolic BP Percentile --      Pulse Rate 07/19/24 1406 76     Resp 07/19/24 1406 16     Temp 07/19/24 1406 97.7 F (36.5 C)     Temp Source 07/19/24 1406 Oral     SpO2 07/19/24 1406 99 %     Weight --      Height --      Head Circumference --      Peak Flow --      Pain Score 07/19/24 1405 8     Pain Loc --      Pain Education --      Exclude from Growth Chart --    No data found.  Updated Vital Signs BP 135/75 (BP Location: Left Arm)   Pulse 76   Temp 97.7 F (36.5 C) (Oral)   Resp 16   LMP 07/08/2024 (Exact Date)   SpO2 99%   Visual Acuity Right Eye Distance:   Left Eye Distance:   Bilateral Distance:    Right Eye Near:   Left Eye Near:    Bilateral Near:     Physical Exam Vitals and nursing note reviewed.  Constitutional:      General: She is not in acute distress.    Appearance: She is well-developed. She is not ill-appearing.  HENT:     Head: Normocephalic and atraumatic.     Right Ear: Tympanic membrane and ear canal normal.     Left Ear: Tympanic membrane and ear canal normal.     Nose: No congestion.     Mouth/Throat:     Mouth: Mucous membranes are moist.     Pharynx: Oropharynx is clear. Uvula midline. No oropharyngeal exudate or posterior oropharyngeal erythema.     Tonsils: No tonsillar exudate or tonsillar abscesses.  Eyes:     Conjunctiva/sclera: Conjunctivae normal.     Pupils: Pupils are equal, round, and reactive to light.  Cardiovascular:     Rate and Rhythm: Normal rate and regular rhythm.     Heart sounds:  Normal heart sounds.  Pulmonary:     Effort: Pulmonary effort is normal.     Breath sounds: Normal breath sounds. No wheezing, rhonchi or rales.  Musculoskeletal:     Cervical back: Normal range of motion and neck supple.  Lymphadenopathy:     Cervical: No cervical adenopathy.  Skin:    General: Skin is warm and dry.  Neurological:     General: No focal deficit present.     Mental Status: She is alert and oriented to person, place, and time.  Psychiatric:        Mood and Affect: Mood normal.        Behavior: Behavior normal.      UC Treatments / Results  Labs (all labs ordered are listed, but only abnormal results are displayed) Labs Reviewed - No data to display  EKG   Radiology No results found.  Procedures Procedures (including critical care time)  Medications Ordered in UC Medications - No data to display  Initial Impression / Assessment and Plan / UC Course  I have reviewed the triage vital signs and the nursing notes.  Pertinent labs & imaging results that were available during my care of the patient were reviewed by me and considered in my medical decision making (see chart for details).     Reviewed exam and symptoms with patient.  Patient's son tested positive for influenza A today at same visit.  Given her close exposure and symptoms we will treat with Tamiflu  twice daily for 5 days.  Refilled albuterol  inhaler and also do prednisone  daily for 5 days to prevent asthma flare.  Promethazine  DM as needed for cough, side effect profile reviewed.  Encourage rest fluids and PCP follow-up 2 to 3 days for recheck.  ER precautions reviewed. Final Clinical Impressions(s) / UC Diagnoses   Final diagnoses:  Influenza-like symptoms  Exposure to influenza  Mild intermittent asthma, unspecified whether complicated     Discharge Instructions      I refilled your albuterol  inhaler please start prednisone  daily for 5 days to help prevent asthma flare.  Given your  symptoms and close exposure to the flu were going to treat you with Tamiflu  which is an antiviral to help prevent complication and severity of symptoms from the flu.  This does not make the flu go away.  You may take Promethazine  DM as needed for your cough.  Please note this medication will make you drowsy.  Do not drink alcohol or drive on this medication.  May continue over-the-counter Tylenol  or ibuprofen .  Lots of rest and fluids.  Please follow-up with your PCP in 2 to 3 days for recheck.  Please go to the ER for any worsening symptoms.  I hope you feel better soon!     ED Prescriptions     Medication Sig Dispense Auth. Provider   oseltamivir  (TAMIFLU ) 75 MG capsule Take 1 capsule (75 mg total) by mouth every 12 (twelve) hours. 10 capsule Carmelle Bamberg, Jodi R, NP   albuterol  (VENTOLIN  HFA) 108 (90 Base) MCG/ACT inhaler Inhale 1-2 puffs into the lungs every 6 (six) hours as needed. 1 each Faigy Stretch, Jodi R, NP   promethazine -dextromethorphan (PROMETHAZINE -DM) 6.25-15 MG/5ML syrup Take 5 mLs by mouth 4 (four) times daily as needed for cough. 118 mL Ajahnae Rathgeber, Jodi R, NP   predniSONE  (DELTASONE ) 20 MG tablet Take 2 tablets (40 mg total) by  mouth daily with breakfast for 5 days. 10 tablet Kisean Rollo, Jodi R, NP      PDMP not reviewed this encounter.     [1]  Social History Tobacco Use   Smoking status: Former    Current packs/day: 0.00    Types: E-cigarettes, Cigarettes    Quit date: 06/04/2004    Years since quitting: 20.1   Smokeless tobacco: Never   Tobacco comments:    1-800- ouit # given  Substance Use Topics   Alcohol use: No    Comment: socially before pregancy   Drug use: No     Loreda Myla SAUNDERS, NP 07/19/24 1452  "

## 2024-07-19 NOTE — ED Triage Notes (Addendum)
 Pt states headache and sneezing started this morning. States she has not taken anything at home for her headache.

## 2024-07-19 NOTE — Discharge Instructions (Addendum)
 I refilled your albuterol  inhaler please start prednisone  daily for 5 days to help prevent asthma flare.  Given your symptoms and close exposure to the flu were going to treat you with Tamiflu  which is an antiviral to help prevent complication and severity of symptoms from the flu.  This does not make the flu go away.  You may take Promethazine  DM as needed for your cough.  Please note this medication will make you drowsy.  Do not drink alcohol or drive on this medication.  May continue over-the-counter Tylenol  or ibuprofen .  Lots of rest and fluids.  Please follow-up with your PCP in 2 to 3 days for recheck.  Please go to the ER for any worsening symptoms.  I hope you feel better soon!
